# Patient Record
Sex: Female | Born: 1955
Health system: Southern US, Community
[De-identification: ages and names within clinical notes are randomized; demographics above are authoritative.]

## PROBLEM LIST (undated history)

## (undated) DIAGNOSIS — I2510 Atherosclerotic heart disease of native coronary artery without angina pectoris: Secondary | ICD-10-CM

## (undated) DIAGNOSIS — T8859XA Other complications of anesthesia, initial encounter: Secondary | ICD-10-CM

## (undated) DIAGNOSIS — I10 Essential (primary) hypertension: Secondary | ICD-10-CM

## (undated) DIAGNOSIS — R000 Tachycardia, unspecified: Secondary | ICD-10-CM

## (undated) DIAGNOSIS — N289 Disorder of kidney and ureter, unspecified: Secondary | ICD-10-CM

## (undated) DIAGNOSIS — J189 Pneumonia, unspecified organism: Secondary | ICD-10-CM

## (undated) HISTORY — PX: FRACTURE SURGERY: SHX138

## (undated) HISTORY — PX: OTHER SURGICAL HISTORY: SHX169

---

## 2005-05-06 DIAGNOSIS — I2510 Atherosclerotic heart disease of native coronary artery without angina pectoris: Secondary | ICD-10-CM

## 2005-05-06 HISTORY — DX: Atherosclerotic heart disease of native coronary artery without angina pectoris: I25.10

## 2011-11-02 ENCOUNTER — Encounter (HOSPITAL_COMMUNITY): Payer: Self-pay | Admitting: *Deleted

## 2011-11-02 ENCOUNTER — Emergency Department (HOSPITAL_COMMUNITY)
Admission: EM | Admit: 2011-11-02 | Discharge: 2011-11-02 | Disposition: A | Discharge status: Home / Self Care | Payer: 59 | Attending: Emergency Medicine | Admitting: Emergency Medicine

## 2011-11-02 DIAGNOSIS — I10 Essential (primary) hypertension: Secondary | ICD-10-CM

## 2011-11-02 DIAGNOSIS — E871 Hypo-osmolality and hyponatremia: Secondary | ICD-10-CM | POA: Insufficient documentation

## 2011-11-02 DIAGNOSIS — R109 Unspecified abdominal pain: Secondary | ICD-10-CM | POA: Insufficient documentation

## 2011-11-02 DIAGNOSIS — R000 Tachycardia, unspecified: Secondary | ICD-10-CM | POA: Insufficient documentation

## 2011-11-02 HISTORY — DX: Essential (primary) hypertension: I10

## 2011-11-02 HISTORY — DX: Disorder of kidney and ureter, unspecified: N28.9

## 2011-11-02 LAB — BASIC METABOLIC PANEL
CO2: 23 mEq/L (ref 19–32)
Calcium: 9.5 mg/dL (ref 8.4–10.5)
Chloride: 90 mEq/L — ABNORMAL LOW (ref 96–112)
GFR calc non Af Amer: >90 mL/min (ref >90)
Potassium: 3.5 mEq/L (ref 3.5–5.1)

## 2011-11-02 LAB — CBC WITH DIFFERENTIAL/PLATELET
Basophils Absolute: 0 10*3/uL (ref 0.0–0.1)
Basophils Relative: 0 % (ref 0–1)
HCT: 41.8 % (ref 36.0–46.0)
Hemoglobin: 14.9 g/dL (ref 12.0–15.0)
Lymphocytes Relative: 23 % (ref 12–46)
MCHC: 35.6 g/dL (ref 30.0–36.0)
Neutro Abs: 7.2 10*3/uL (ref 1.7–7.7)
Neutrophils Relative %: 67 % (ref 43–77)
RDW: 13.8 % (ref 11.5–15.5)
WBC: 10.7 10*3/uL — ABNORMAL HIGH (ref 4.0–10.5)

## 2011-11-02 LAB — URINALYSIS, ROUTINE W REFLEX MICROSCOPIC
Bilirubin Urine: NEGATIVE
Glucose, UA: NEGATIVE mg/dL
Hgb urine dipstick: NEGATIVE
Ketones, ur: NEGATIVE mg/dL
Leukocytes, UA: NEGATIVE
Nitrite: NEGATIVE
Protein, ur: NEGATIVE mg/dL
Specific Gravity, Urine: 1.01 (ref 1.005–1.030)
Urobilinogen, UA: 0.2 mg/dL (ref 0.0–1.0)
pH: 6 (ref 5.0–8.0)

## 2011-11-02 MED ORDER — KETOROLAC TROMETHAMINE 30 MG/ML IJ SOLN
30.0000 mg | Freq: Once | INTRAMUSCULAR | Status: AC
  Administered 2011-11-02: 30 mg via INTRAVENOUS
  Filled 2011-11-02: qty 1

## 2011-11-02 MED ORDER — HYDROCODONE-ACETAMINOPHEN 5-325 MG PO TABS: 1.0000 | ORAL_TABLET | Freq: Four times a day (QID) | ORAL | Status: AC | PRN

## 2011-11-02 MED ORDER — NAPROXEN 375 MG PO TABS: 375.0000 mg | ORAL_TABLET | Freq: Two times a day (BID) | ORAL | Status: AC

## 2011-11-02 NOTE — ED Provider Notes (Signed)
History     CSN: [id number]  Arrival date & time 11/02/11  1624   First MD Initiated Contact with Patient 11/02/11 1739      Chief Complaint  Patient presents with  . Flank Pain    right    (Consider location/radiation/quality/duration/timing/severity/associated sxs/prior treatment) HPI Comments: Patient is a 56 year old with a history of hypertension and past renal disease that presents emergency department concerned about her blood pressure and reporting some right flank pain described as a pressure and throbbing.  Onset of symptoms was 930 a.m., severity rated between 2 and 4, pain relieved with movement, no exacerbating factors noted.  Patient denies urinary symptoms including frequency, nocturia, hematuria, polyuria, or dysuria.  Patient states that she had a renal stent placed about 5 years ago when living in [place] however she did not recall the reason and states that she thought it was for her hypertension.  Patient did not have a renal specialist in town.  Patient denies associated symptoms including fevers, night sweats, chills, headaches, change in vision, abdominal pain, nausea, vomiting, chest pain, dizziness, light headedness, syncope, heart palpitations and, melena, hematochezia.  Patient has no other complaints at this time.  Patient is a 56 y.o. female presenting with flank pain. The history is provided by the patient.  Flank Pain Pertinent negatives include no abdominal pain, chest pain, chills, congestion, fever, headaches, numbness or weakness.    Past Medical History  Diagnosis Date  . Hypertension   . Renal disorder     Past Surgical History  Procedure Date  . Stent placement right kidney    . Fracture surgery   . Cesarean section     History reviewed. No pertinent family history.  History  Substance Use Topics  . Smoking status: Current Some Day Smoker    Types: Cigarettes  . Smokeless tobacco: Never Used  . Alcohol Use: Yes     weekends    OB  History    Grav Para Term Preterm Abortions TAB SAB Ect Mult Living                  Review of Systems  Constitutional: Negative for fever, chills and appetite change.  HENT: Negative for congestion.   Eyes: Negative for visual disturbance.  Respiratory: Negative for shortness of breath.   Cardiovascular: Negative for chest pain and leg swelling.  Gastrointestinal: Negative for abdominal pain.  Genitourinary: Positive for flank pain. Negative for dysuria, urgency and frequency.  Neurological: Negative for dizziness, syncope, weakness, light-headedness, numbness and headaches.  Psychiatric/Behavioral: Negative for confusion.  All other systems reviewed and are negative.    Allergies  Review of patient's allergies indicates no known allergies.  Home Medications   Current Outpatient Rx  Name Route Sig Dispense Refill  . AMOXICILLIN 500 MG PO CAPS Oral Take 500 mg by mouth 4 (four) times daily.    . ASPIRIN 81 MG PO TABS Oral Take 81 mg by mouth daily.    Marland Kitchen LISINOPRIL-HYDROCHLOROTHIAZIDE 20-12.5 MG PO TABS Oral Take 1 tablet by mouth daily.    Marland Kitchen PROBIOTIC PO Oral Take 1 capsule by mouth daily.      BP 182/108  Pulse 108  Temp 97.9 F (36.6 C) (Oral)  Resp 18  Ht 5\' 2"  (1.575 m)  Wt 139 lb (63.05 kg)  BMI 25.42 kg/m2  SpO2 97%  Physical Exam  Nursing note and vitals reviewed. Constitutional: She is oriented to person, place, and time. She appears well-developed and well-nourished. No distress.  Hypertensive and tachycardic  HENT:  Head: Normocephalic and atraumatic.  Eyes: Conjunctivae and EOM are normal.  Neck: Normal range of motion.  Cardiovascular:       Tachycardic, regular rhythm, no aberrancy and auscultation, intact distal pulses.  Pulmonary/Chest: Effort normal.  Abdominal:       Bowel sounds present, soft nontender.  Nonpulsatile aorta  Genitourinary:       No CVA tenderness.  Musculoskeletal: Normal range of motion.  Neurological: She is alert and  oriented to person, place, and time.  Skin: Skin is warm and dry. No rash noted. She is not diaphoretic.  Psychiatric: She has a normal mood and affect. Her behavior is normal.    ED Course  Procedures (including critical care time)  Labs Reviewed  CBC WITH DIFFERENTIAL - Abnormal; Notable for the following:    WBC 10.7 (*)     MCH 34.7 (*)     All other components within normal limits  URINALYSIS, ROUTINE W REFLEX MICROSCOPIC  BASIC METABOLIC PANEL   No results found.   No diagnosis found.    MDM  Hypertension, hyponatremia  Results reviewed and discussed with both the attending & pt who has been advised to follow up with her PCP this week to possibly adj HTN medications. Pt does not want a CT for unlikely stone bc normal urine and minimal flank pain (no CVA or abd tenderness on exam). Return precautions discussed.  At this time there does not appear to be any evidence of an acute emergency medical condition and the patient appears stable for discharge with appropriate outpatient follow up.Diagnosis was discussed with patient who verbalizes understanding and is agreeable to discharge. Pt case discussed with Dr. Juleen China who agrees with my plan.          Kristin Schmidt, [place] 11/02/11 1954

## 2011-11-02 NOTE — ED Notes (Signed)
Pt from home with reports of right flank pain as well as elevated blood pressure. Pt denies nausea and vomiting but is having diarrhea but reports that she is on an antibiotic for dental issues. Pt also endorses hx of stent placement of right kidney about 5 years ago due to elevated blood pressure.

## 2011-11-02 NOTE — ED Notes (Signed)
Patient took lisinpril-HCTZ 20/12.5mg  prior to coming in the ED. BP is fluctuating from 150-200s / 80-90s. Asymptomatic. Pain flank are is interm. awaiting for lab results

## 2011-11-02 NOTE — ED Notes (Signed)
Bed:WA09<BR> Expected date:<BR> Expected time:<BR> Means of arrival:<BR> Comments:<BR> Triage 4

## 2011-11-02 NOTE — Discharge Instructions (Signed)
Follow up w ur doctor to adjust your blood pressure medications  SEEK IMMEDIATE MEDICAL CARE IF:  The pain does not go away.  You have a fever >101 that persists You keep throwing up (vomiting) or cannot drink liquids.  The pain becomes localized (Pain in the right side could possibly be appendicitis. In an adult, pain in the left lower portion of the abdomen could be colitis or diverticulitis). You pass bloody or black tarry stools.  You have shaking chills.  There is blood in your vomit or you see blood in your bowel movements.  Your bowel movements stop (become blocked) or you cannot pass gas.  You have bloody, frequent, or painful urination.  You have yellow discoloration in the skin or whites of the eyes.  Your stomach becomes bloated or bigger.  You have dizziness or fainting.  You have chest or back pain.

## 2011-11-07 NOTE — ED Provider Notes (Signed)
Medical screening examination/treatment/procedure(s) were performed by non-physician practitioner and as supervising physician I was immediately available for consultation/collaboration.  Raeford Razor, MD 11/07/11 [phone]

## 2013-10-25 ENCOUNTER — Emergency Department (HOSPITAL_COMMUNITY): Payer: BC Managed Care – PPO

## 2013-10-25 ENCOUNTER — Encounter (HOSPITAL_COMMUNITY): Payer: Self-pay | Admitting: Emergency Medicine

## 2013-10-25 ENCOUNTER — Emergency Department (HOSPITAL_COMMUNITY)
Admission: EM | Admit: 2013-10-25 | Discharge: 2013-10-25 | Disposition: A | Discharge status: Home / Self Care | Payer: BC Managed Care – PPO | Attending: Emergency Medicine | Admitting: Emergency Medicine

## 2013-10-25 DIAGNOSIS — Z87448 Personal history of other diseases of urinary system: Secondary | ICD-10-CM | POA: Insufficient documentation

## 2013-10-25 DIAGNOSIS — Z7982 Long term (current) use of aspirin: Secondary | ICD-10-CM | POA: Insufficient documentation

## 2013-10-25 DIAGNOSIS — F419 Anxiety disorder, unspecified: Secondary | ICD-10-CM

## 2013-10-25 DIAGNOSIS — Z792 Long term (current) use of antibiotics: Secondary | ICD-10-CM | POA: Insufficient documentation

## 2013-10-25 DIAGNOSIS — R000 Tachycardia, unspecified: Secondary | ICD-10-CM | POA: Insufficient documentation

## 2013-10-25 DIAGNOSIS — F17200 Nicotine dependence, unspecified, uncomplicated: Secondary | ICD-10-CM | POA: Insufficient documentation

## 2013-10-25 DIAGNOSIS — I10 Essential (primary) hypertension: Secondary | ICD-10-CM | POA: Insufficient documentation

## 2013-10-25 DIAGNOSIS — F411 Generalized anxiety disorder: Secondary | ICD-10-CM | POA: Insufficient documentation

## 2013-10-25 DIAGNOSIS — R0789 Other chest pain: Secondary | ICD-10-CM | POA: Insufficient documentation

## 2013-10-25 DIAGNOSIS — Z79899 Other long term (current) drug therapy: Secondary | ICD-10-CM | POA: Insufficient documentation

## 2013-10-25 LAB — URINALYSIS, ROUTINE W REFLEX MICROSCOPIC
BILIRUBIN URINE: NEGATIVE
Glucose, UA: NEGATIVE mg/dL
HGB URINE DIPSTICK: NEGATIVE
Ketones, ur: NEGATIVE mg/dL
Leukocytes, UA: NEGATIVE
Nitrite: NEGATIVE
PROTEIN: NEGATIVE mg/dL
Specific Gravity, Urine: 1.002 — ABNORMAL LOW (ref 1.005–1.030)
UROBILINOGEN UA: 0.2 mg/dL (ref 0.0–1.0)
pH: 5.5 (ref 5.0–8.0)

## 2013-10-25 LAB — COMPREHENSIVE METABOLIC PANEL
ALT: 13 U/L (ref 0–35)
AST: 20 U/L (ref 0–37)
Albumin: 4 g/dL (ref 3.5–5.2)
Alkaline Phosphatase: 61 U/L (ref 39–117)
BILIRUBIN TOTAL: 0.3 mg/dL (ref 0.3–1.2)
BUN: 13 mg/dL (ref 6–23)
CHLORIDE: 95 meq/L — AB (ref 96–112)
CO2: 20 meq/L (ref 19–32)
CREATININE: 0.59 mg/dL (ref 0.50–1.10)
Calcium: 9.1 mg/dL (ref 8.4–10.5)
GLUCOSE: 97 mg/dL (ref 70–99)
Potassium: 4.3 mEq/L (ref 3.7–5.3)
Sodium: 134 mEq/L — ABNORMAL LOW (ref 137–147)
Total Protein: 7.2 g/dL (ref 6.0–8.3)

## 2013-10-25 LAB — CBC WITH DIFFERENTIAL/PLATELET
BASOS ABS: 0.1 10*3/uL (ref 0.0–0.1)
Basophils Relative: 1 % (ref 0–1)
Eosinophils Absolute: 0 10*3/uL (ref 0.0–0.7)
Eosinophils Relative: 0 % (ref 0–5)
HEMATOCRIT: 37.9 % (ref 36.0–46.0)
HEMOGLOBIN: 13 g/dL (ref 12.0–15.0)
LYMPHS ABS: 2.2 10*3/uL (ref 0.7–4.0)
LYMPHS PCT: 21 % (ref 12–46)
MCH: 32.3 pg (ref 26.0–34.0)
MCHC: 34.3 g/dL (ref 30.0–36.0)
MCV: 94.3 fL (ref 78.0–100.0)
MONO ABS: 0.7 10*3/uL (ref 0.1–1.0)
MONOS PCT: 7 % (ref 3–12)
NEUTROS ABS: 7.6 10*3/uL (ref 1.7–7.7)
Neutrophils Relative %: 71 % (ref 43–77)
Platelets: 288 10*3/uL (ref 150–400)
RBC: 4.02 MIL/uL (ref 3.87–5.11)
RDW: 13.8 % (ref 11.5–15.5)
WBC: 10.6 10*3/uL — AB (ref 4.0–10.5)

## 2013-10-25 LAB — TROPONIN I

## 2013-10-25 LAB — I-STAT TROPONIN, ED: Troponin i, poc: 0.04 ng/mL (ref 0.00–0.08)

## 2013-10-25 MED ORDER — LORAZEPAM 2 MG/ML IJ SOLN
1.0000 mg | Freq: Once | INTRAMUSCULAR | Status: AC
  Administered 2013-10-25: 1 mg via INTRAVENOUS
  Filled 2013-10-25: qty 1

## 2013-10-25 MED ORDER — SODIUM CHLORIDE 0.9 % IV BOLUS (SEPSIS)
1000.0000 mL | Freq: Once | INTRAVENOUS | Status: AC
  Administered 2013-10-25: 1000 mL via INTRAVENOUS

## 2013-10-25 NOTE — ED Notes (Signed)
Pt c/o lightheadedness and feeling anxious. Pt states she drunk wine last night and felt better. Denies pain.

## 2013-10-25 NOTE — Discharge Instructions (Signed)
Chest Pain (Nonspecific) °It is often hard to give a specific diagnosis for the cause of chest pain. There is always a chance that your pain could be related to something serious, such as a heart attack or a blood clot in the lungs. You need to follow up with your health care provider for further evaluation. °CAUSES  °· Heartburn. °· Pneumonia or bronchitis. °· Anxiety or stress. °· Inflammation around your heart (pericarditis) or lung (pleuritis or pleurisy). °· A blood clot in the lung. °· A collapsed lung (pneumothorax). It can develop suddenly on its own (spontaneous pneumothorax) or from trauma to the chest. °· Shingles infection (herpes zoster virus). °The chest wall is composed of bones, muscles, and cartilage. Any of these can be the source of the pain. °· The bones can be bruised by injury. °· The muscles or cartilage can be strained by coughing or overwork. °· The cartilage can be affected by inflammation and become sore (costochondritis). °DIAGNOSIS  °Lab tests or other studies may be needed to find the cause of your pain. Your health care provider may have you take a test called an ambulatory electrocardiogram (ECG). An ECG records your heartbeat patterns over a 24-hour period. You may also have other tests, such as: °· Transthoracic echocardiogram (TTE). During echocardiography, sound waves are used to evaluate how blood flows through your heart. °· Transesophageal echocardiogram (TEE). °· Cardiac monitoring. This allows your health care provider to monitor your heart rate and rhythm in real time. °· Holter monitor. This is a portable device that records your heartbeat and can help diagnose heart arrhythmias. It allows your health care provider to track your heart activity for several days, if needed. °· Stress tests by exercise or by giving medicine that makes the heart beat faster. °TREATMENT  °· Treatment depends on what may be causing your chest pain. Treatment may include: °· Acid blockers for  heartburn. °· Anti-inflammatory medicine. °· Pain medicine for inflammatory conditions. °· Antibiotics if an infection is present. °· You may be advised to change lifestyle habits. This includes stopping smoking and avoiding alcohol, caffeine, and chocolate. °· You may be advised to keep your head raised (elevated) when sleeping. This reduces the chance of acid going backward from your stomach into your esophagus. °Most of the time, nonspecific chest pain will improve within 2-3 days with rest and mild pain medicine.  °HOME CARE INSTRUCTIONS  °· If antibiotics were prescribed, take them as directed. Finish them even if you start to feel better. °· For the next few days, avoid physical activities that bring on chest pain. Continue physical activities as directed. °· Do not use any tobacco products, including cigarettes, chewing tobacco, or electronic cigarettes. °· Avoid drinking alcohol. °· Only take medicine as directed by your health care provider. °· Follow your health care provider's suggestions for further testing if your chest pain does not go away. °· Keep any follow-up appointments you made. If you do not go to an appointment, you could develop lasting (chronic) problems with pain. If there is any problem keeping an appointment, call to reschedule. °SEEK MEDICAL CARE IF:  °· Your chest pain does not go away, even after treatment. °· You have a rash with blisters on your chest. °· You have a fever. °SEEK IMMEDIATE MEDICAL CARE IF:  °· You have increased chest pain or pain that spreads to your arm, neck, jaw, back, or abdomen. °· You have shortness of breath. °· You have an increasing cough, or you cough   up blood. °· You have severe back or abdominal pain. °· You feel nauseous or vomit. °· You have severe weakness. °· You faint. °· You have chills. °This is an emergency. Do not wait to see if the pain will go away. Get medical help at once. Call your local emergency services (911 in [place] Do not drive  yourself to the hospital. °MAKE SURE YOU:  °· Understand these instructions. °· Will watch your condition. °· Will get help right away if you are not doing well or get worse. °Document Released: 01/30/2005 Document Revised: 04/27/2013 Document Reviewed: 11/26/2007 °ExitCare® Patient Information ©2015 [employer]. This information is not intended to replace advice given to you by your health care provider. Make sure you discuss any questions you have with your health care provider. ° °Panic Attacks °Panic attacks are sudden, short-lived surges of severe anxiety, fear, or discomfort. They may occur for no reason when you are relaxed, when you are anxious, or when you are sleeping. Panic attacks may occur for a number of reasons:  °· Healthy people occasionally have panic attacks in extreme, life-threatening situations, such as war or natural disasters. Normal anxiety is a protective mechanism of the body that helps us react to danger (fight or flight response). °· Panic attacks are often seen with anxiety disorders, such as panic disorder, social anxiety disorder, generalized anxiety disorder, and phobias. Anxiety disorders cause excessive or uncontrollable anxiety. They may interfere with your relationships or other life activities. °· Panic attacks are sometimes seen with other mental illnesses, such as depression and posttraumatic stress disorder. °· Certain medical conditions, prescription medicines, and drugs of abuse can cause panic attacks. °SYMPTOMS  °Panic attacks start suddenly, peak within 20 minutes, and are accompanied by four or more of the following symptoms: °· Pounding heart or fast heart rate (palpitations). °· Sweating. °· Trembling or shaking. °· Shortness of breath or feeling smothered. °· Feeling choked. °· Chest pain or discomfort. °· Nausea or strange feeling in your stomach. °· Dizziness, light-headedness, or feeling like you will faint. °· Chills or hot flushes. °· Numbness or tingling in  your lips or hands and feet. °· Feeling that things are not real or feeling that you are not yourself. °· Fear of losing control or going crazy. °· Fear of dying. °Some of these symptoms can mimic serious medical conditions. For example, you may think you are having a heart attack. Although panic attacks can be very scary, they are not life threatening. °DIAGNOSIS  °Panic attacks are diagnosed through an assessment by your health care provider. Your health care provider will ask questions about your symptoms, such as where and when they occurred. Your health care provider will also ask about your medical history and use of alcohol and drugs, including prescription medicines. Your health care provider may order blood tests or other studies to rule out a serious medical condition. Your health care provider may refer you to a mental health professional for further evaluation. °TREATMENT  °· Most healthy people who have one or two panic attacks in an extreme, life-threatening situation will not require treatment. °· The treatment for panic attacks associated with anxiety disorders or other mental illness typically involves counseling with a mental health professional, medicine, or a combination of both. Your health care provider will help determine what treatment is best for you. °· Panic attacks due to physical illness usually go away with treatment of the illness. If prescription medicine is causing panic attacks, talk with your health care   provider about stopping the medicine, decreasing the dose, or substituting another medicine. °· Panic attacks due to alcohol or drug abuse go away with abstinence. Some adults need professional help in order to stop drinking or using drugs. °HOME CARE INSTRUCTIONS  °· Take all medicines as directed by your health care provider.   °· Schedule and attend follow-up visits as directed by your health care provider. It is important to keep all your appointments. °SEEK MEDICAL CARE  IF: °· You are not able to take your medicines as prescribed. °· Your symptoms do not improve or get worse. °SEEK IMMEDIATE MEDICAL CARE IF:  °· You experience panic attack symptoms that are different than your usual symptoms. °· You have serious thoughts about hurting yourself or others. °· You are taking medicine for panic attacks and have a serious side effect. °MAKE SURE YOU: °· Understand these instructions. °· Will watch your condition. °· Will get help right away if you are not doing well or get worse. °Document Released: 04/22/2005 Document Revised: 04/27/2013 Document Reviewed: 12/04/2012 °ExitCare® Patient Information ©2015 [employer]. This information is not intended to replace advice given to you by your health care provider. Make sure you discuss any questions you have with your health care provider. ° °

## 2013-10-25 NOTE — ED Provider Notes (Signed)
CSN: [id number]     Arrival date & time 10/25/13  0913 History   First MD Initiated Contact with Patient 10/25/13 [phone]     Chief Complaint  Patient presents with  . Dizziness  . Anxiety     (Consider location/radiation/quality/duration/timing/severity/associated sxs/prior Treatment) Patient is a 58 y.o. female presenting with chest pain.  Chest Pain Pain location:  Substernal area Pain quality: tightness   Pain radiates to:  Does not radiate Pain severity:  Moderate Onset quality:  Gradual Duration:  2 days Timing:  Intermittent Progression:  Unchanged Chronicity:  New Context: stress ("I've been under a lot of stress with my job and with my daughter moving")   Relieved by: Started yesterday, drank a glass of wine which resolved her symptoms. Worsened by:  Nothing tried Associated symptoms: anxiety, dizziness (Described as swimmy headed.  Not a room spinning and not lightheaded.) and shortness of breath (None currently)   Associated symptoms: no abdominal pain, no nausea and not vomiting     Past Medical History  Diagnosis Date  . Hypertension   . Renal disorder    Past Surgical History  Procedure Laterality Date  . Stent placement right kidney     . Cesarean section    . Fracture surgery      leg   No family history on file. History  Substance Use Topics  . Smoking status: Current Some Day Smoker    Types: Cigarettes  . Smokeless tobacco: Never Used  . Alcohol Use: Yes     Comment: weekends   OB History   Grav Para Term Preterm Abortions TAB SAB Ect Mult Living                 Review of Systems  Respiratory: Positive for shortness of breath (None currently).   Cardiovascular: Positive for chest pain.  Gastrointestinal: Negative for nausea, vomiting and abdominal pain.  Neurological: Positive for dizziness (Described as swimmy headed.  Not a room spinning and not lightheaded.).  All other systems reviewed and are negative.     Allergies  Review of  patient's allergies indicates no known allergies.  Home Medications   Prior to Admission medications   Medication Sig Start Date End Date Taking? Authorizing Provider  amoxicillin (AMOXIL) 500 MG capsule Take 500 mg by mouth 4 (four) times daily.    Historical Provider, MD  aspirin 81 MG tablet Take 81 mg by mouth daily.    Historical Provider, MD  lisinopril-hydrochlorothiazide (PRINZIDE,ZESTORETIC) 20-12.5 MG per tablet Take 1 tablet by mouth daily.    Historical Provider, MD  Probiotic Product (PROBIOTIC PO) Take 1 capsule by mouth daily.    Historical Provider, MD   BP 217/95  Pulse 136  Temp(Src) 98.2 F (36.8 C) (Oral)  Resp 20  SpO2 99% Physical Exam  Nursing note and vitals reviewed. Constitutional: She is oriented to person, place, and time. She appears well-developed and well-nourished. No distress.  HENT:  Head: Normocephalic and atraumatic.  Mouth/Throat: Oropharynx is clear and moist.  Eyes: Conjunctivae are normal. Pupils are equal, round, and reactive to light. No scleral icterus.  Neck: Neck supple.  Cardiovascular: Regular rhythm, normal heart sounds and intact distal pulses.  Tachycardia present.   No murmur heard. Pulmonary/Chest: Effort normal and breath sounds normal. No stridor. No respiratory distress. She has no rales.  Abdominal: Soft. Bowel sounds are normal. She exhibits no distension. There is no tenderness.  Musculoskeletal: Normal range of motion. She exhibits no edema.  Neurological: She  is alert and oriented to person, place, and time. She has normal strength. No cranial nerve deficit or sensory deficit. Coordination and gait normal. GCS eye subscore is 4. GCS verbal subscore is 5. GCS motor subscore is 6.  Skin: Skin is warm and dry. No rash noted.  Psychiatric: She has a normal mood and affect. Her behavior is normal.    ED Course  Procedures (including critical care time) Labs Review Labs Reviewed - No data to display  Imaging Review Dg  Chest 2 View  10/25/2013   CLINICAL DATA:  Chest pain.  EXAM: CHEST  2 VIEW  COMPARISON:  None.  FINDINGS: The heart size and mediastinal contours are within normal limits. Both lungs are clear. No pleural effusion or pneumothorax is noted. The visualized skeletal structures are unremarkable.  IMPRESSION: No acute cardiopulmonary abnormality seen.   Electronically Signed   By: Roque LiasJames  Green M.D.   On: 10/25/2013 11:27  All radiology studies independently viewed by me.      EKG Interpretation   Date/Time:  Monday October 25 2013 11:25:35 EDT Ventricular Rate:  116 PR Interval:  162 QRS Duration: 77 QT Interval:  320 QTC Calculation: 444 R Axis:   88 Text Interpretation:  Sinus tachycardia Right atrial enlargement  Borderline right axis deviation Artifact in lead(s) II aVR aVL and  baseline wander in lead(s) II III aVF No significant change was found  Confirmed by [facility] CenterWOFFORD  MD, TREY ([id number]) on 10/25/2013 2:15:39 PM      MDM   Final diagnoses:  Other chest pain  Anxiety    58 year old female with history of hypertension presenting with chest pain and dizziness. She initially thought her symptoms were secondary to anxiety, but wanted to get checked out "to make sure I wasn't having a heart attack or a stroke."  On exam, well-appearing, nontoxic, but tachycardic and hypertensive. History atypical for ACS, PE, or dissection.  Lab work negative. Blood pressure and heart rate improved.  Symptoms resolved after Ativan. 2 troponins were negative. Heart rate less than 100 on my discharge exam. Still hypertensive, but without signs of hypertensive emergency. Advised PCP followup. Given return precautions.  Candyce ChurnJohn David Wofford III, MD 10/25/13 [phone]

## 2015-09-11 DIAGNOSIS — I10 Essential (primary) hypertension: Secondary | ICD-10-CM | POA: Insufficient documentation

## 2015-12-13 DIAGNOSIS — E785 Hyperlipidemia, unspecified: Secondary | ICD-10-CM | POA: Insufficient documentation

## 2016-02-18 ENCOUNTER — Emergency Department (HOSPITAL_COMMUNITY): Payer: BLUE CROSS/BLUE SHIELD

## 2016-02-18 ENCOUNTER — Encounter (HOSPITAL_COMMUNITY): Payer: Self-pay | Admitting: Emergency Medicine

## 2016-02-18 ENCOUNTER — Emergency Department (HOSPITAL_COMMUNITY)
Admission: EM | Admit: 2016-02-18 | Discharge: 2016-02-18 | Disposition: A | Discharge status: Home / Self Care | Payer: BLUE CROSS/BLUE SHIELD | Attending: Emergency Medicine | Admitting: Emergency Medicine

## 2016-02-18 DIAGNOSIS — Z79899 Other long term (current) drug therapy: Secondary | ICD-10-CM | POA: Diagnosis not present

## 2016-02-18 DIAGNOSIS — Z7982 Long term (current) use of aspirin: Secondary | ICD-10-CM | POA: Insufficient documentation

## 2016-02-18 DIAGNOSIS — F17210 Nicotine dependence, cigarettes, uncomplicated: Secondary | ICD-10-CM | POA: Diagnosis not present

## 2016-02-18 DIAGNOSIS — R0602 Shortness of breath: Secondary | ICD-10-CM | POA: Diagnosis present

## 2016-02-18 DIAGNOSIS — I10 Essential (primary) hypertension: Secondary | ICD-10-CM | POA: Insufficient documentation

## 2016-02-18 DIAGNOSIS — R0789 Other chest pain: Secondary | ICD-10-CM | POA: Diagnosis not present

## 2016-02-18 LAB — I-STAT TROPONIN, ED
TROPONIN I, POC: 0 ng/mL (ref 0.00–0.08)
Troponin i, poc: 0 ng/mL (ref 0.00–0.08)

## 2016-02-18 LAB — BASIC METABOLIC PANEL
Anion gap: 8 (ref 5–15)
BUN: 12 mg/dL (ref 6–20)
CALCIUM: 9.1 mg/dL (ref 8.9–10.3)
CO2: 24 mmol/L (ref 22–32)
CREATININE: 0.94 mg/dL (ref 0.44–1.00)
Chloride: 104 mmol/L (ref 101–111)
GFR calc non Af Amer: >60 mL/min (ref >60)
GLUCOSE: 136 mg/dL — AB (ref 65–99)
Potassium: 4.6 mmol/L (ref 3.5–5.1)
Sodium: 136 mmol/L (ref 135–145)

## 2016-02-18 LAB — CBC
HCT: 36.4 % (ref 36.0–46.0)
Hemoglobin: 12.4 g/dL (ref 12.0–15.0)
MCH: 31.7 pg (ref 26.0–34.0)
MCHC: 34.1 g/dL (ref 30.0–36.0)
MCV: 93.1 fL (ref 78.0–100.0)
PLATELETS: 325 10*3/uL (ref 150–400)
RBC: 3.91 MIL/uL (ref 3.87–5.11)
RDW: 13.9 % (ref 11.5–15.5)
WBC: 7.2 10*3/uL (ref 4.0–10.5)

## 2016-02-18 NOTE — Discharge Instructions (Signed)
It was a pleasure taking care of you today. Your EKG, chest X-ray, blood work, and troponin (marker of strain on the heart) all came back negative. Based on these tests and the history you provided, you have a low risk of having a coronary event. Please follow-up with your primary care provider regarding the symptoms you've been having. I've also provided the contact info for Dr. Elberta Fortisamnitz since you mentioned his name and are interested in seeing a cardiologist.

## 2016-02-18 NOTE — ED Notes (Signed)
Pt family member came out of the room and questioned the charge nurse about the frequency of vitals. Family member advised that she took her mother's vitals and handed written vitals to charge RN. Charge RN advised that vitals are obtained every four hours for level 3 patients and her mother is on the monitor as well. Family member sts that she is an ED Charity fundraiserN at [employer]. When phlebotomy placed an orange tourniquet on pt, she advised that she doesn't like orange tourniquets and blue ones are better. Phlebotomy advised that they do not have blue ones.

## 2016-02-18 NOTE — ED Notes (Signed)
Patient adds that she is "minding a broken toe on right foot, today is my first day in a regular shoe".

## 2016-02-18 NOTE — ED Triage Notes (Signed)
Patient c/o SOB and chest pressure that is intermittent over the past 2 days.  Patient states that had some nausea with the pain.  Patient adds nothing specific makes the pain come on.

## 2016-02-18 NOTE — ED Provider Notes (Signed)
[facility]-EMERGENCY DEPT Provider Note   CSN: [id number] Arrival date & time: 02/18/16  0904     History   Chief Complaint Chief Complaint  Patient presents with  . Shortness of Breath  . Chest Pain    HPI Kristin Schmidt is a 60 y.o. female.  Patient is 60 yo F with PMH of hypertension, atherosclerosis (underwent cardiac cath in [place] in 2007 and had renal artery stent placed), and 30 pack year smoking history, presenting with chief complaint of shortness of breath and "chest tightness" that has been worsening since Friday. She denies any chest pain, or pains radiating into jaw or neck. No personal history of CAD or CHF, or family history of heart disease. She also denies any history of asthma, chronic bronchitis, COPD, or emphysema. She states the shortness of breath is exertional, but she commented she's not been as ambulatory recently because she broke a toe. She denies any history of DVT/PE. Denies exogenous estrogen use, lower extremity pain or swelling, recent travel, history of bleeding/clotting disorders, cough, or hemoptysis. Also states she felt nauseous on Friday, but symptoms have resolved. Denies any abdominal pain, vomiting, change in bowel habits, or blood in stools. Regularly sees PCP, Laureen Ochs, with last appointment on 12/13/2015, but states she's been having a hard time managing her BP. Takes daily losartan. Does not see cardiologist. Commented that she has a stressful job working in [employer], and has experienced similar "tightness" when feeling anxious.      Past Medical History:  Diagnosis Date  . Hypertension   . Renal disorder     There are no active problems to display for this patient.   Past Surgical History:  Procedure Laterality Date  . CESAREAN SECTION    . FRACTURE SURGERY     leg  . stent placement right kidney       OB History    No data available       Home Medications    Prior to Admission medications   Medication Sig  Start Date End Date Taking? Authorizing Provider  aspirin 81 MG tablet Take 81 mg by mouth daily.    Historical Provider, MD  losartan (COZAAR) 100 MG tablet Take 100 mg by mouth daily.    Historical Provider, MD  nicotine (NICODERM CQ - DOSED IN MG/24 HOURS) 21 mg/24hr patch Place 21 mg onto the skin daily.    Historical Provider, MD    Family History No family history on file.  Social History Social History  Substance Use Topics  . Smoking status: Current Some Day Smoker    Types: Cigarettes  . Smokeless tobacco: Never Used  . Alcohol use Yes     Comment: weekends     Allergies   Review of patient's allergies indicates no known allergies.   Review of Systems Review of Systems  Constitutional: Negative for chills and fever.  HENT: Negative for ear pain and sore throat.   Eyes: Negative for pain and visual disturbance.  Respiratory: Positive for chest tightness and shortness of breath. Negative for cough and wheezing.   Cardiovascular: Negative for chest pain, palpitations and leg swelling.  Gastrointestinal: Positive for nausea. Negative for abdominal pain, blood in stool and vomiting.  Genitourinary: Negative for dysuria, flank pain and hematuria.  Musculoskeletal: Negative for back pain and neck pain.  Skin: Negative for color change and rash.  Neurological: Negative for dizziness, seizures, syncope, weakness, numbness and headaches.     Physical Exam Updated Vital Signs BP 168/69 (BP  Location: Left Arm)   Pulse 71   Temp 98 F (36.7 C) (Oral)   Resp 19   Ht 5\' 2"  (1.575 m)   Wt 67.1 kg   SpO2 100%   BMI 27.07 kg/m   Physical Exam  Constitutional: She appears well-developed and well-nourished. No distress.  Resting comfortably in bed, O2 sat 99% RA  HENT:  Head: Normocephalic and atraumatic.  Mouth/Throat: Oropharynx is clear and moist.  Eyes: Conjunctivae are normal.  Neck: Normal range of motion.  Cardiovascular: Normal rate, regular rhythm, normal  heart sounds and intact distal pulses.   Pulmonary/Chest: Effort normal and breath sounds normal. No respiratory distress.  Abdominal: Soft. There is no tenderness.  Musculoskeletal: Normal range of motion. She exhibits no edema or tenderness.  Neurological: She is alert.  Skin: Skin is warm and dry.  Psychiatric: She has a normal mood and affect.  Nursing note and vitals reviewed.    ED Treatments / Results  Labs (all labs ordered are listed, but only abnormal results are displayed) Labs Reviewed  CBC  BASIC METABOLIC PANEL  I-STAT TROPOININ, ED    EKG  EKG Interpretation  Date/Time:  Sunday February 18 2016 09:14:17 EDT Ventricular Rate:  78 PR Interval:    QRS Duration: 77 QT Interval:  398 QTC Calculation: 454 R Axis:   74 Text Interpretation:  Sinus rhythm Minimal ST elevation, inferior leads Baseline wander Confirmed by Lincoln Brighamees, Liz [phone]([id number]) on 02/18/2016 9:20:23 AM       Radiology Dg Chest 2 View  Result Date: 02/18/2016 CLINICAL DATA:  Chest pain EXAM: CHEST  2 VIEW COMPARISON:  October 25, 2013 FINDINGS: The heart size and mediastinal contours are within normal limits. Both lungs are clear. The visualized skeletal structures are unremarkable. IMPRESSION: No active cardiopulmonary disease. Electronically Signed   By: Gerome Samavid  Williams III M.D   On: 02/18/2016 09:40    Procedures Procedures (including critical care time)  Medications Ordered in ED Medications - No data to display   Initial Impression / Assessment and Plan / ED Course  I have reviewed the triage vital signs and the nursing notes.  Pertinent labs & imaging results that were available during my care of the patient were reviewed by me and considered in my medical decision making (see chart for details).  Clinical Course  Value Comment By Time  DG Chest 2 View (Reviewed) Jari PiggDaryl F de Villier II, [place] 60/4510/15 1100   Patient is 60 yo F with PMH of hypertension, atherosclerosis, and 30 pack year smoking  history, presenting with chief complaint of shortness of breath and chest tightness since Friday. No personal or family history of CAD . No chronic respiratory disease, and low risk factors for  DVT/PE. Reports similar symptoms when anxious. EKG sinus rhythm with some baseline wander, reviewed by attending physician, Dr. Tilden FossaElizabeth Rees. CBC and BMP unremarkable. Personally reviewed CRX, which shows no acute cardiopulmonary disease. Initial and 3 hour troponin both 0.00. Heart score of 3, and patient stable for d/c home. Advised to f/u with PCP for management of hypertension, and possible cardiology referral. Discussed strict return precautions for symptoms including chest pain, shortness of breath, nausea, vomiting, dizziness, or syncope. Patient agreeable to plan, and appreciative of care.  Final Clinical Impressions(s) / ED Diagnoses   Final diagnoses:  Shortness of breath  Chest tightness    New Prescriptions New Prescriptions   No medications on file     Jari PiggDaryl F de Villier II, [place] 02/19/16 1347  Tilden Fossa, MD 02/20/16 1014

## 2016-02-19 ENCOUNTER — Encounter: Payer: Self-pay | Admitting: Cardiology

## 2016-02-20 NOTE — Progress Notes (Signed)
Electrophysiology Office Note   Date:  02/21/2016   ID:  Kristin Schmidt, DOB March 02, 1956, MRN [id number]  PCP:  Eather Colas, FNP  Cardiologist:   Regan Lemming, MD    Chief Complaint  Patient presents with  . New Patient (Initial Visit)    SOB     History of Present Illness: Kristin Schmidt is a 60 y.o. female who presents today for electrophysiology evaluation.   PMH of hypertension, atherosclerosis (underwent cardiac cath in [place] in 2007 and had renal artery stent placed, as well as a stent to her left subclavian), and 30 pack year smoking history. Presented to the ER with SOB and chest tightness. No chest pain or radiation of tightness. SOB is exertional . No abdominal complaints at the time. Also with chest tightness when feeling anxious. She says that her chest discomfort occurs for most of the day and is potentially exacerbated by exertion. Unfortunately she broke her toe a few months ago and has not been able to get regular exercise. She says that she has gained approximately 15 pounds in the last 2 years. She feels like this is due to the addition of metoprolol to her medication regimen.   Today, she denies symptoms of palpitations, chest pain, shortness of breath, orthopnea, PND, lower extremity edema, claudication, dizziness, presyncope, syncope, bleeding, or neurologic sequela. The patient is tolerating medications without difficulties and is otherwise without complaint today.    Past Medical History:  Diagnosis Date  . Hypertension   . Renal disorder    Past Surgical History:  Procedure Laterality Date  . CESAREAN SECTION    . FRACTURE SURGERY     leg  . stent placement right kidney        Current Outpatient Prescriptions  Medication Sig Dispense Refill  . aspirin EC 81 MG tablet Take 81 mg by mouth daily.    Marland Kitchen losartan (COZAAR) 100 MG tablet Take 100 mg by mouth daily.    Marland Kitchen zolpidem (AMBIEN CR) 6.25 MG CR tablet Take 6.25 mg by mouth at  bedtime as needed for sleep.     No current facility-administered medications for this visit.     Allergies:   Review of patient's allergies indicates no known allergies.   Social History:  The patient  reports that she has been smoking Cigarettes.  She has never used smokeless tobacco. She reports that she drinks alcohol. She reports that she does not use drugs.   Family History:  The patient's family history includes COPD in her father and mother; Cancer in her sister; Heart attack in her maternal grandfather.    ROS:  Please see the history of present illness.   Otherwise, review of systems is positive for chest pressure, DOE.   All other systems are reviewed and negative.    PHYSICAL EXAM: VS:  BP (!) 156/80   Pulse 80   Ht 5\' 2"  (1.575 m)   Wt 151 lb 3.2 oz (68.6 kg)   BMI 27.65 kg/m  , BMI Body mass index is 27.65 kg/m. GEN: Well nourished, well developed, in no acute distress  HEENT: normal  Neck: no JVD, carotid bruits, or masses Cardiac: RRR; no murmurs, rubs, or gallops,no edema  Respiratory:  clear to auscultation bilaterally, normal work of breathing GI: soft, nontender, nondistended, + BS MS: no deformity or atrophy  Skin: warm and dry Neuro:  Strength and sensation are intact Psych: euthymic mood, full affect  EKG:  EKG is not ordered today. Personal  review of the ekg ordered 02/19/16 shows sinus rhythm, rate 78, early repolarization  Recent Labs: 02/18/2016: BUN 12; Creatinine, Ser 0.94; Hemoglobin 12.4; Platelets 325; Potassium 4.6; Sodium 136    Lipid Panel  No results found for: CHOL, TRIG, HDL, CHOLHDL, VLDL, LDLCALC, LDLDIRECT   Wt Readings from Last 3 Encounters:  02/21/16 151 lb 3.2 oz (68.6 kg)  02/18/16 148 lb (67.1 kg)  11/02/11 139 lb (63 kg)      Other studies Reviewed: Additional studies/ records that were reviewed today include: ER notes    ASSESSMENT AND PLAN:  1.  Chest pressure: Does not appear to be cardiac in nature, but does  have some typical features of shortness of breath with exertion and potentially chest pain which is worse with exertion. We'll order a rest stress Myoview.  2. Hypertension: Elevated today and apparently has been in the 200s at times. We Kristin Schmidt stop her metoprolol and start her on carvedilol 25 mg as well as the addition of 5 mg of Norvasc.   Current medicines are reviewed at length with the patient today.   The patient does not have concerns regarding her medicines.  The following changes were made today:  Stop metoprolol start coreg and norvasc  Labs/ tests ordered today include:  No orders of the defined types were placed in this encounter.    Disposition:   FU with [facility]  3 months  Signed, Kristin Rhoads Jorja LoaMartin Apryl Brymer, MD  02/21/2016 12:03 PM     [facility] [address] [place] [address] [phone] (office) [phone] (fax)

## 2016-02-21 ENCOUNTER — Encounter: Payer: Self-pay | Admitting: Cardiology

## 2016-02-21 ENCOUNTER — Ambulatory Visit (INDEPENDENT_AMBULATORY_CARE_PROVIDER_SITE_OTHER): Payer: BLUE CROSS/BLUE SHIELD | Admitting: Cardiology

## 2016-02-21 VITALS — BP 156/80 | HR 80 | Ht 62.0 in | Wt 151.2 lb

## 2016-02-21 DIAGNOSIS — R0609 Other forms of dyspnea: Secondary | ICD-10-CM | POA: Diagnosis not present

## 2016-02-21 MED ORDER — AMLODIPINE BESYLATE 5 MG PO TABS: 5.0000 mg | ORAL_TABLET | Freq: Every day | ORAL | 6 refills | Status: DC

## 2016-02-21 MED ORDER — CARVEDILOL 25 MG PO TABS: 25.0000 mg | ORAL_TABLET | Freq: Two times a day (BID) | ORAL | 6 refills | Status: DC

## 2016-02-21 NOTE — Patient Instructions (Addendum)
Medication Instructions:    Your physician has recommended you make the following change in your medication:   1) STOP Metoprolol  2) START Carvedilol 25 mg twice a day  3) START Amlodipine 5 mg once a day  --- If you need a refill on your cardiac medications before your next appointment, please call your pharmacy. ---  Labwork:  None ordered  Testing/Procedures: Your physician has requested that you have a lexiscan myoview. For further information please visit [email or web address] Please follow instruction sheet, as given.  Follow-Up:  Your physician recommends that you schedule a follow-up appointment in: 3 months with Dr. Elberta Schmidt.  Thank you for choosing [facility]!!     Any Other Special Instructions Will Be Listed Below (If Applicable).  Carvedilol tablets What is this medicine? CARVEDILOL (KAR ve dil ol) is a beta-blocker. Beta-blockers reduce the workload on the heart and help it to beat more regularly. This medicine is used to treat high blood pressure and heart failure. This medicine may be used for other purposes; ask your health care provider or pharmacist if you have questions. What should I tell my health care provider before I take this medicine? They need to know if you have any of these conditions: -circulation problems -diabetes -history of heart attack or heart disease -liver disease -lung or breathing disease, like asthma or emphysema -pheochromocytoma -slow or irregular heartbeat -thyroid disease -an unusual or allergic reaction to carvedilol, other beta-blockers, medicines, foods, dyes, or preservatives -pregnant or trying to get pregnant -breast-feeding How should I use this medicine? Take this medicine by mouth with a glass of water. Follow the directions on the prescription label. It is best to take the tablets with food. Take your doses at regular intervals. Do not take your medicine more often than directed. Do not stop taking except on the  advice of your doctor or health care professional. Talk to your pediatrician regarding the use of this medicine in children. Special care may be needed. Overdosage: If you think you have taken too much of this medicine contact a poison control center or emergency room at once. NOTE: This medicine is only for you. Do not share this medicine with others. What if I miss a dose? If you miss a dose, take it as soon as you can. If it is almost time for your next dose, take only that dose. Do not take double or extra doses. What may interact with this medicine? This medicine may interact with the following medications: -certain medicines for blood pressure, heart disease, irregular heart beat -certain medicines for depression, like fluoxetine or paroxetine -certain medicines for diabetes, like glipizide or glyburide -cimetidine -clonidine -cyclosporine -digoxin -MAOIs like Carbex, Eldepryl, Marplan, Nardil, and Parnate -reserpine -rifampin This list may not describe all possible interactions. Give your health care provider a list of all the medicines, herbs, non-prescription drugs, or dietary supplements you use. Also tell them if you smoke, drink alcohol, or use illegal drugs. Some items may interact with your medicine. What should I watch for while using this medicine? Check your heart rate and blood pressure regularly while you are taking this medicine. Ask your doctor or health care professional what your heart rate and blood pressure should be, and when you should contact him or her. Do not stop taking this medicine suddenly. This could lead to serious heart-related effects. Contact your doctor or health care professional if you have difficulty breathing while taking this drug. Check your weight daily. Ask your doctor or health  care professional when you should notify him/her of any weight gain. You may get drowsy or dizzy. Do not drive, use machinery, or do anything that requires mental  alertness until you know how this medicine affects you. To reduce the risk of dizzy or fainting spells, do not sit or stand up quickly. Alcohol can make you more drowsy, and increase flushing and rapid heartbeats. Avoid alcoholic drinks. If you have diabetes, check your blood sugar as directed. Tell your doctor if you have changes in your blood sugar while you are taking this medicine. If you are going to have surgery, tell your doctor or health care professional that you are taking this medicine. What side effects may I notice from receiving this medicine? Side effects that you should report to your doctor or health care professional as soon as possible: -allergic reactions like skin rash, itching or hives, swelling of the face, lips, or tongue -breathing problems -dark urine -irregular heartbeat -swollen legs or ankles -vomiting -yellowing of the eyes or skin Side effects that usually do not require medical attention (report to your doctor or health care professional if they continue or are bothersome): -change in sex drive or performance -diarrhea -dry eyes (especially if wearing contact lenses) -dry, itching skin -headache -nausea -unusually tired This list may not describe all possible side effects. Call your doctor for medical advice about side effects. You may report side effects to FDA at 1-800-FDA-1088. Where should I keep my medicine? Keep out of the reach of children. Store at room temperature below 30 degrees C (86 degrees F). Protect from moisture. Keep container tightly closed. Throw away any unused medicine after the expiration date. NOTE: This sheet is a summary. It may not cover all possible information. If you have questions about this medicine, talk to your doctor, pharmacist, or health care provider.    2016, Elsevier/Gold Standard. (2012-12-27 14:12:02)   Pharmacologic Stress Electrocardiogram A pharmacologic stress electrocardiogram is a heart (cardiac) test that  uses nuclear imaging to evaluate the blood supply to your heart. This test may also be called a pharmacologic stress electrocardiography. Pharmacologic means that a medicine is used to increase your heart rate and blood pressure.  This stress test is done to find areas of poor blood flow to the heart by determining the extent of coronary artery disease (CAD). Some people exercise on a treadmill, which naturally increases the blood flow to the heart. For those people unable to exercise on a treadmill, a medicine is used. This medicine stimulates your heart and will cause your heart to beat harder and more quickly, as if you were exercising.  Pharmacologic stress tests can help determine:  The adequacy of blood flow to your heart during increased levels of activity in order to clear you for discharge home.  The extent of coronary artery blockage caused by CAD.  Your prognosis if you have suffered a heart attack.  The effectiveness of cardiac procedures done, such as an angioplasty, which can increase the circulation in your coronary arteries.  Causes of chest pain or pressure. LET [facility] KNOW ABOUT:  Any allergies you have.  All medicines you are taking, including vitamins, herbs, eye drops, creams, and over-the-counter medicines.  Previous problems you or members of your family have had with the use of anesthetics.  Any blood disorders you have.  Previous surgeries you have had.  Medical conditions you have.  Possibility of pregnancy, if this applies.  If you are currently breastfeeding. RISKS AND  COMPLICATIONS Generally, this is a safe procedure. However, as with any procedure, complications can occur. Possible complications include:  You develop pain or pressure in the following areas:  Chest.  Jaw or neck.  Between your shoulder blades.  Radiating down your left arm.  Headache.  Dizziness or light-headedness.  Shortness of breath.  Increased or  irregular heartbeat.  Low blood pressure.  Nausea or vomiting.  Flushing.  Redness going up the arm and slight pain during injection of medicine.  Heart attack (rare). BEFORE THE PROCEDURE   Avoid all forms of caffeine for 24 hours before your test or as directed by your health care provider. This includes coffee, tea (even decaffeinated tea), caffeinated sodas, chocolate, cocoa, and certain pain medicines.  Follow your health care provider's instructions regarding eating and drinking before the test.  Take your medicines as directed at regular times with water unless instructed otherwise. Exceptions may include:  If you have diabetes, ask how you are to take your insulin or pills. It is common to adjust insulin dosing the morning of the test.  If you are taking beta-blocker medicines, it is important to talk to your health care provider about these medicines well before the date of your test. Taking beta-blocker medicines may interfere with the test. In some cases, these medicines need to be changed or stopped 24 hours or more before the test.  If you wear a nitroglycerin patch, it may need to be removed prior to the test. Ask your health care provider if the patch should be removed before the test.  If you use an inhaler for any breathing condition, bring it with you to the test.  If you are an outpatient, bring a snack so you can eat right after the stress phase of the test.  Do not smoke for 4 hours prior to the test or as directed by your health care provider.  Do not apply lotions, powders, creams, or oils on your chest prior to the test.  Wear comfortable shoes and clothing. Let your health care provider know if you were unable to complete or follow the preparations for your test. PROCEDURE   Multiple patches (electrodes) will be put on your chest. If needed, small areas of your chest may be shaved to get better contact with the electrodes. Once the electrodes are attached  to your body, multiple wires will be attached to the electrodes, and your heart rate will be monitored.  An IV access will be started. A nuclear trace (isotope) is given. The isotope may be given intravenously, or it may be swallowed. Nuclear refers to several types of radioactive isotopes, and the nuclear isotope lights up the arteries so that the nuclear images are clear. The isotope is absorbed by your body. This results in low radiation exposure.  A resting nuclear image is taken to show how your heart functions at rest.  A medicine is given through the IV access.  A second scan is done about 1 hour after the medicine injection and determines how your heart functions under stress.  During this stress phase, you will be connected to an electrocardiogram machine. Your blood pressure and oxygen levels will be monitored. AFTER THE PROCEDURE   Your heart rate and blood pressure will be monitored after the test.  You may return to your normal schedule, including diet,activities, and medicines, unless your health care provider tells you otherwise.   This information is not intended to replace advice given to you by your health  care provider. Make sure you discuss any questions you have with your health care provider.   Document Released: 09/08/2008 Document Revised: 04/27/2013 Document Reviewed: 12/28/2012 Elsevier Interactive Patient Education [employer]

## 2016-02-26 ENCOUNTER — Telehealth (HOSPITAL_COMMUNITY): Payer: Self-pay | Admitting: *Deleted

## 2016-02-26 NOTE — Telephone Encounter (Signed)
Patient given detailed instructions per Myocardial Perfusion Study Information Sheet for the test on 02/28/16 at 0715. Patient notified to arrive 15 minutes early and that it is imperative to arrive on time for appointment to keep from having the test rescheduled.  If you need to cancel or reschedule your appointment, please call the office within 24 hours of your appointment. Failure to do so may result in a cancellation of your appointment, and a $50 no show fee. Patient verbalized understanding.Kristin Schmidt, Luellen W    

## 2016-02-28 ENCOUNTER — Telehealth: Payer: Self-pay | Admitting: *Deleted

## 2016-02-28 ENCOUNTER — Ambulatory Visit (HOSPITAL_COMMUNITY): Payer: BLUE CROSS/BLUE SHIELD | Attending: Cardiovascular Disease

## 2016-02-28 ENCOUNTER — Ambulatory Visit: Payer: 59 | Admitting: Cardiology

## 2016-02-28 DIAGNOSIS — R079 Chest pain, unspecified: Secondary | ICD-10-CM | POA: Diagnosis not present

## 2016-02-28 DIAGNOSIS — I10 Essential (primary) hypertension: Secondary | ICD-10-CM | POA: Insufficient documentation

## 2016-02-28 DIAGNOSIS — R0609 Other forms of dyspnea: Secondary | ICD-10-CM | POA: Insufficient documentation

## 2016-02-28 LAB — MYOCARDIAL PERFUSION IMAGING
CHL CUP NUCLEAR SDS: 1
CSEPPHR: 98 {beats}/min
LV dias vol: 66 mL (ref 46–106)
LV sys vol: 15 mL
RATE: 0.33
Rest HR: 70 {beats}/min
SRS: 4
SSS: 5
TID: 0.97

## 2016-02-28 MED ORDER — TECHNETIUM TC 99M TETROFOSMIN IV KIT
10.9000 | PACK | Freq: Once | INTRAVENOUS | Status: AC | PRN
  Administered 2016-02-28: 10.9 via INTRAVENOUS
  Filled 2016-02-28: qty 11

## 2016-02-28 MED ORDER — TECHNETIUM TC 99M TETROFOSMIN IV KIT
32.6000 | PACK | Freq: Once | INTRAVENOUS | Status: AC | PRN
  Administered 2016-02-28: 32.6 via INTRAVENOUS
  Filled 2016-02-28: qty 33

## 2016-02-28 MED ORDER — REGADENOSON 0.4 MG/5ML IV SOLN
0.4000 mg | Freq: Once | INTRAVENOUS | Status: AC
  Administered 2016-02-28: 0.4 mg via INTRAVENOUS

## 2016-02-28 NOTE — Telephone Encounter (Signed)
Informed pt of stress test results. Pt verbalized understanding.  Pt warned me that her cell service was bad and she would call back if call dropped. While speaking with pt she asked if I could ask Dr. Elberta Fortisamnitz if he would be willing to decrease her Carvedilol.  Pt began speaking of her BP but was breaking up real bad and then call dropped.  Will await return call from pt.

## 2016-02-28 NOTE — Telephone Encounter (Signed)
-----   Message from Will Jorja LoaMartin Camnitz, MD sent at 02/28/2016  2:31 PM EDT ----- Low risk stress test

## 2016-02-29 MED ORDER — CARVEDILOL 25 MG PO TABS: 12.5000 mg | ORAL_TABLET | Freq: Two times a day (BID) | ORAL | 3 refills | Status: DC

## 2016-02-29 NOTE — Telephone Encounter (Signed)
Advised ok to decrease Carvedilol to 12.5 mg BID. She will call office if BP increases after decreasing med.

## 2016-02-29 NOTE — Telephone Encounter (Signed)
Called patient to follow up after not hearing back from her yesterday.   Reports BPs much better since switching to Carvedilol and adding Norvasc.  Reports SBPs averaging 100-110.  She does complain of weakness/fatigue.  States this was worse when she was on Metoprolol but remains an issue on Carvedilol. She would like to know if ok to try and reduce dosage to see if improvement in symptoms. She understands I will review with physician and call her with recommendations. She is agreeable to plan.

## 2016-05-30 ENCOUNTER — Encounter: Payer: Self-pay | Admitting: Cardiology

## 2016-05-30 ENCOUNTER — Ambulatory Visit (INDEPENDENT_AMBULATORY_CARE_PROVIDER_SITE_OTHER): Payer: BLUE CROSS/BLUE SHIELD | Admitting: Cardiology

## 2016-05-30 VITALS — BP 160/90 | HR 82 | Ht 62.0 in | Wt 156.4 lb

## 2016-05-30 DIAGNOSIS — I10 Essential (primary) hypertension: Secondary | ICD-10-CM | POA: Diagnosis not present

## 2016-05-30 MED ORDER — HYDROCHLOROTHIAZIDE 25 MG PO TABS: 25.0000 mg | ORAL_TABLET | Freq: Every day | ORAL | 3 refills | Status: DC

## 2016-05-30 MED ORDER — CARVEDILOL 6.25 MG PO TABS: 6.2500 mg | ORAL_TABLET | Freq: Two times a day (BID) | ORAL | 3 refills | Status: DC

## 2016-05-30 NOTE — Progress Notes (Signed)
Electrophysiology Office Note   Date:  05/30/2016   ID:  Kristin Schmidt, DOB 1955/12/06, MRN [id number]  PCP:  Eather Colas, FNP  Cardiologist:   Regan Lemming, MD    Chief Complaint  Patient presents with  . Follow-up    DOE Jonni Sanger presure     History of Present Illness: Kristin Schmidt is a 61 y.o. female who presents today for electrophysiology evaluation.   PMH of hypertension, atherosclerosis (underwent cardiac cath in [place] in 2007 and had renal artery stent placed, as well as a stent to her left subclavian), and 30 pack year smoking history. Presented to the ER with SOB and chest tightness. Myoview was low risk with a normal LVEF.She has had no further episodes of chest pain since her Myoview was performed. She says that her blood pressures at home generally ranged from the 120s to mid 130s on her home cuff. She has had a 6 pound weight gain since starting her medications, and she feels that is mainly due to fluid retention.   Today, she denies symptoms of palpitations, chest pain, shortness of breath, orthopnea, PND, lower extremity edema, claudication, dizziness, presyncope, syncope, bleeding, or neurologic sequela. The patient is tolerating medications without difficulties and is otherwise without complaint today.    Past Medical History:  Diagnosis Date  . Hypertension   . Renal disorder    Past Surgical History:  Procedure Laterality Date  . CESAREAN SECTION    . FRACTURE SURGERY     leg  . stent placement right kidney        Current Outpatient Prescriptions  Medication Sig Dispense Refill  . amLODipine (NORVASC) 5 MG tablet Take 1 tablet (5 mg total) by mouth daily. 30 tablet 6  . aspirin EC 81 MG tablet Take 81 mg by mouth daily.    . carvedilol (COREG) 25 MG tablet Take 0.5 tablets (12.5 mg total) by mouth 2 (two) times daily. 90 tablet 3  . losartan (COZAAR) 100 MG tablet Take 100 mg by mouth daily.    Marland Kitchen zolpidem (AMBIEN CR) 6.25 MG  CR tablet Take 6.25 mg by mouth at bedtime as needed for sleep.     No current facility-administered medications for this visit.     Allergies:   Patient has no known allergies.   Social History:  The patient  reports that she has been smoking Cigarettes.  She has never used smokeless tobacco. She reports that she drinks alcohol. She reports that she does not use drugs.   Family History:  The patient's family history includes COPD in her father and mother; Cancer in her sister; Heart attack in her maternal grandfather.    ROS:  Please see the history of present illness.   Otherwise, review of systems is positive for weight gain, rash.   All other systems are reviewed and negative.    PHYSICAL EXAM: VS:  BP (!) 160/90   Pulse 82   Ht 5\' 2"  (1.575 m)   Wt 156 lb 6.4 oz (70.9 kg)   BMI 28.61 kg/m  , BMI Body mass index is 28.61 kg/m. GEN: Well nourished, well developed, in no acute distress  HEENT: normal  Neck: no JVD, carotid bruits, or masses Cardiac: RRR; no murmurs, rubs, or gallops,no edema  Respiratory:  clear to auscultation bilaterally, normal work of breathing GI: soft, nontender, nondistended, + BS MS: no deformity or atrophy  Skin: warm and dry Neuro:  Strength and sensation are intact Psych: euthymic mood,  full affect  EKG:  EKG is not ordered today. Personal review of the ekg ordered 02/19/16 shows sinus rhythm, rate 78, early repolarization  Recent Labs: 02/18/2016: BUN 12; Creatinine, Ser 0.94; Hemoglobin 12.4; Platelets 325; Potassium 4.6; Sodium 136    Lipid Panel  No results found for: CHOL, TRIG, HDL, CHOLHDL, VLDL, LDLCALC, LDLDIRECT   Wt Readings from Last 3 Encounters:  05/30/16 156 lb 6.4 oz (70.9 kg)  02/28/16 151 lb (68.5 kg)  02/21/16 151 lb 3.2 oz (68.6 kg)      Other studies Reviewed: Additional studies/ records that were reviewed today include: SPECT 02/28/16  The left ventricular ejection fraction is hyperdynamic (>65%).  Nuclear  stress EF: 77%.  There was no ST segment deviation noted during stress.  The study is normal.  This is a low risk study.       ASSESSMENT AND PLAN:  1.  Hypertension: elevated today. She has been having quite a bit of swelling and a 6 pound weight gain. Due to that, we'll stop her Norvasc and start her on HCTZ. She is also been having significant amounts of fatigue, we will therefore decrease her carvedilol to 6.25 mg. Should her blood pressure remain elevated, we'll potentially need to add an alpha blocker to her regimen. She will call the clinic in 2 weeks with blood pressure measurements from home.  2. Hyperlipidemia: Has declined therapy in the past  Current medicines are reviewed at length with the patient today.   The patient does not have concerns regarding her medicines.  The following changes were made today:   Stop Norvasc, start HCTZ, decrease carvedilol  Labs/ tests ordered today include:  No orders of the defined types were placed in this encounter.    Disposition:   FU with Will Camnitz 3 months  Signed, Will Jorja LoaMartin Camnitz, MD  05/30/2016 4:32 PM     [facility] [address] [place] [address] [phone] (office) [phone] (fax)

## 2016-05-30 NOTE — Patient Instructions (Signed)
Medication Instructions:    Your physician has recommended you make the following change in your medication: 1) DECREASE Carvedilol to 6.25 mg twice a day 2) STOP Amlodipine 3) START Hydrochlorothiazide 25 mg once daily  --- If you need a refill on your cardiac medications before your next appointment, please call your pharmacy. ---  Labwork:  None ordered  Testing/Procedures:  None ordered  Follow-Up:  Your physician recommends that you schedule a follow-up appointment in: 3 months with Dr. Elberta Fortisamnitz.  Thank you for choosing [facility]!!   Dory HornSherri Kitiara Hintze, RN [phone]   Any Other Special Instructions Will Be Listed Below (If Applicable).  Please keep track of your blood pressure.  Call Meilyn Heindl, RN in several weeks to let her know what your readings are.   Hydrochlorothiazide, HCTZ capsules or tablets What is this medicine? HYDROCHLOROTHIAZIDE (hye droe klor oh THYE a zide) is a diuretic. It increases the amount of urine passed, which causes the body to lose salt and water. This medicine is used to treat high blood pressure. It is also reduces the swelling and water retention caused by various medical conditions, such as heart, liver, or kidney disease. This medicine may be used for other purposes; ask your health care provider or pharmacist if you have questions. COMMON BRAND NAME(S): Esidrix, Ezide, HydroDIURIL, Microzide, Oretic, Zide What should I tell my health care provider before I take this medicine? They need to know if you have any of these conditions: -diabetes -gout -immune system problems, like lupus -kidney disease or kidney stones -liver disease -pancreatitis -small amount of urine or difficulty passing urine -an unusual or allergic reaction to hydrochlorothiazide, sulfa drugs, other medicines, foods, dyes, or preservatives -pregnant or trying to get pregnant -breast-feeding How should I use this medicine? Take this medicine by mouth with a glass  of water. Follow the directions on the prescription label. Take your medicine at regular intervals. Remember that you will need to pass urine frequently after taking this medicine. Do not take your doses at a time of day that will cause you problems. Do not stop taking your medicine unless your doctor tells you to. Talk to your pediatrician regarding the use of this medicine in children. Special care may be needed. Overdosage: If you think you have taken too much of this medicine contact a poison control center or emergency room at once. NOTE: This medicine is only for you. Do not share this medicine with others. What if I miss a dose? If you miss a dose, take it as soon as you can. If it is almost time for your next dose, take only that dose. Do not take double or extra doses. What may interact with this medicine? -cholestyramine -colestipol -digoxin -dofetilide -lithium -medicines for blood pressure -medicines for diabetes -medicines that relax muscles for surgery -other diuretics -steroid medicines like prednisone or cortisone This list may not describe all possible interactions. Give your health care provider a list of all the medicines, herbs, non-prescription drugs, or dietary supplements you use. Also tell them if you smoke, drink alcohol, or use illegal drugs. Some items may interact with your medicine. What should I watch for while using this medicine? Visit your doctor or health care professional for regular checks on your progress. Check your blood pressure as directed. Ask your doctor or health care professional what your blood pressure should be and when you should contact him or her. You may need to be on a special diet while taking this medicine. Ask  your doctor. Check with your doctor or health care professional if you get an attack of severe diarrhea, nausea and vomiting, or if you sweat a lot. The loss of too much body fluid can make it dangerous for you to take this  medicine. You may get drowsy or dizzy. Do not drive, use machinery, or do anything that needs mental alertness until you know how this medicine affects you. Do not stand or sit up quickly, especially if you are an older patient. This reduces the risk of dizzy or fainting spells. Alcohol may interfere with the effect of this medicine. Avoid alcoholic drinks. This medicine may affect your blood sugar level. If you have diabetes, check with your doctor or health care professional before changing the dose of your diabetic medicine. This medicine can make you more sensitive to the sun. Keep out of the sun. If you cannot avoid being in the sun, wear protective clothing and use sunscreen. Do not use sun lamps or tanning beds/booths. What side effects may I notice from receiving this medicine? Side effects that you should report to your doctor or health care professional as soon as possible: -allergic reactions such as skin rash or itching, hives, swelling of the lips, mouth, tongue, or throat -changes in vision -chest pain -eye pain -fast or irregular heartbeat -feeling faint or lightheaded, falls -gout attack -muscle pain or cramps -pain or difficulty when passing urine -pain, tingling, numbness in the hands or feet -redness, blistering, peeling or loosening of the skin, including inside the mouth -unusually weak or tired Side effects that usually do not require medical attention (report to your doctor or health care professional if they continue or are bothersome): -change in sex drive or performance -dry mouth -headache -stomach upset This list may not describe all possible side effects. Call your doctor for medical advice about side effects. You may report side effects to FDA at 1-800-FDA-1088. Where should I keep my medicine? Keep out of the reach of children. Store at room temperature between 15 and 30 degrees C (59 and 86 degrees F). Do not freeze. Protect from light and moisture. Keep  container closed tightly. Throw away any unused medicine after the expiration date. NOTE: This sheet is a summary. It may not cover all possible information. If you have questions about this medicine, talk to your doctor, pharmacist, or health care provider.  2017 Elsevier/Gold Standard (2009-12-15 12:57:37)

## 2016-05-30 NOTE — Addendum Note (Signed)
Addended by: Baird LyonsPRICE, Tahjai Schetter L on: 05/30/2016 04:46 PM   Modules accepted: Orders

## 2016-09-04 ENCOUNTER — Ambulatory Visit (INDEPENDENT_AMBULATORY_CARE_PROVIDER_SITE_OTHER): Payer: PRIVATE HEALTH INSURANCE | Admitting: Cardiology

## 2016-09-04 VITALS — BP 132/72 | HR 94 | Ht 62.0 in | Wt 154.2 lb

## 2016-09-04 DIAGNOSIS — Z79899 Other long term (current) drug therapy: Secondary | ICD-10-CM | POA: Diagnosis not present

## 2016-09-04 DIAGNOSIS — I10 Essential (primary) hypertension: Secondary | ICD-10-CM | POA: Diagnosis not present

## 2016-09-04 MED ORDER — FUROSEMIDE 20 MG PO TABS: 20.0000 mg | ORAL_TABLET | Freq: Every day | ORAL | 6 refills | Status: DC

## 2016-09-04 MED ORDER — AMLODIPINE BESYLATE 5 MG PO TABS: 5.0000 mg | ORAL_TABLET | Freq: Every day | ORAL | 6 refills | Status: DC

## 2016-09-04 NOTE — Patient Instructions (Addendum)
Medication Instructions:   Your physician has recommended you make the following change in your medication:  1) STOP Carvedilol 2) START Amlodipine 5 mg once daily 3) STOP Hydrochlorothiazide 4) START Furosemide (lasix) 20 mg once daily  Labwork:  None ordered  Testing/Procedures:  None ordered  Follow-Up:  Your physician wants you to follow-up in: 6 months with Dr. Curt Bears.  You will receive a reminder letter in the mail two months in advance. If you don't receive a letter, please call our office to schedule the follow-up appointment.  - If you need a refill on your cardiac medications before your next appointment, please call your pharmacy.   Thank you for choosing [facility]!!   Trinidad Curet, RN [phone]   Any Other Special Instructions Will Be Listed Below (If Applicable).  Amlodipine tablets What is this medicine? AMLODIPINE (am LOE di peen) is a calcium-channel blocker. It affects the amount of calcium found in your heart and muscle cells. This relaxes your blood vessels, which can reduce the amount of work the heart has to do. This medicine is used to lower high blood pressure. It is also used to prevent chest pain. This medicine may be used for other purposes; ask your health care provider or pharmacist if you have questions. COMMON BRAND NAME(S): Norvasc What should I tell my health care provider before I take this medicine? They need to know if you have any of these conditions: -heart problems like heart failure or aortic stenosis -liver disease -an unusual or allergic reaction to amlodipine, other medicines, foods, dyes, or preservatives -pregnant or trying to get pregnant -breast-feeding How should I use this medicine? Take this medicine by mouth with a glass of water. Follow the directions on the prescription label. Take your medicine at regular intervals. Do not take more medicine than directed. Talk to your pediatrician regarding the use of this  medicine in children. Special care may be needed. This medicine has been used in children as young as 6. Persons over 76 years old may have a stronger reaction to this medicine and need smaller doses. Overdosage: If you think you have taken too much of this medicine contact a poison control center or emergency room at once. NOTE: This medicine is only for you. Do not share this medicine with others. What if I miss a dose? If you miss a dose, take it as soon as you can. If it is almost time for your next dose, take only that dose. Do not take double or extra doses. What may interact with this medicine? -herbal or dietary supplements -local or general anesthetics -medicines for high blood pressure -medicines for prostate problems -rifampin This list may not describe all possible interactions. Give your health care provider a list of all the medicines, herbs, non-prescription drugs, or dietary supplements you use. Also tell them if you smoke, drink alcohol, or use illegal drugs. Some items may interact with your medicine. What should I watch for while using this medicine? Visit your doctor or health care professional for regular check ups. Check your blood pressure and pulse rate regularly. Ask your health care professional what your blood pressure and pulse rate should be, and when you should contact him or her. This medicine may make you feel confused, dizzy or lightheaded. Do not drive, use machinery, or do anything that needs mental alertness until you know how this medicine affects you. To reduce the risk of dizzy or fainting spells, do not sit or stand up quickly,  especially if you are an older patient. Avoid alcoholic drinks; they can make you more dizzy. Do not suddenly stop taking amlodipine. Ask your doctor or health care professional how you can gradually reduce the dose. What side effects may I notice from receiving this medicine? Side effects that you should report to your doctor or  health care professional as soon as possible: -allergic reactions like skin rash, itching or hives, swelling of the face, lips, or tongue -breathing problems -changes in vision or hearing -chest pain -fast, irregular heartbeat -swelling of legs or ankles Side effects that usually do not require medical attention (report to your doctor or health care professional if they continue or are bothersome): -dry mouth -facial flushing -nausea, vomiting -stomach gas, pain -tired, weak -trouble sleeping This list may not describe all possible side effects. Call your doctor for medical advice about side effects. You may report side effects to FDA at 1-800-FDA-1088. Where should I keep my medicine? Keep out of the reach of children. Store at room temperature between 59 and 86 degrees F (15 and 30 degrees C). Protect from light. Keep container tightly closed. Throw away any unused medicine after the expiration date. NOTE: This sheet is a summary. It may not cover all possible information. If you have questions about this medicine, talk to your doctor, pharmacist, or health care provider.  2018 Elsevier/Gold Standard (2012-03-20 11:40:58)   Furosemide tablets What is this medicine? FUROSEMIDE (fyoor [place] se mide) is a diuretic. It helps you make more urine and to lose salt and excess water from your body. This medicine is used to treat high blood pressure, and edema or swelling from heart, kidney, or liver disease. This medicine may be used for other purposes; ask your health care provider or pharmacist if you have questions. COMMON BRAND NAME(S): Active-Medicated Specimen Kit, Delone, Diuscreen, Lasix, RX Specimen Collection Kit, Specimen Collection Kit, URINX Medicated Specimen Collection What should I tell my health care provider before I take this medicine? They need to know if you have any of these conditions: -abnormal blood electrolytes -diarrhea or vomiting -gout -heart disease -kidney  disease, small amounts of urine, or difficulty passing urine -liver disease -thyroid disease -an unusual or allergic reaction to furosemide, sulfa drugs, other medicines, foods, dyes, or preservatives -pregnant or trying to get pregnant -breast-feeding How should I use this medicine? Take this medicine by mouth with a glass of water. Follow the directions on the prescription label. You may take this medicine with or without food. If it upsets your stomach, take it with food or milk. Do not take your medicine more often than directed. Remember that you will need to pass more urine after taking this medicine. Do not take your medicine at a time of day that will cause you problems. Do not take at bedtime. Talk to your pediatrician regarding the use of this medicine in children. While this drug may be prescribed for selected conditions, precautions do apply. Overdosage: If you think you have taken too much of this medicine contact a poison control center or emergency room at once. NOTE: This medicine is only for you. Do not share this medicine with others. What if I miss a dose? If you miss a dose, take it as soon as you can. If it is almost time for your next dose, take only that dose. Do not take double or extra doses. What may interact with this medicine? -aspirin and aspirin-like medicines -certain antibiotics -chloral hydrate -cisplatin -cyclosporine -digoxin -diuretics -laxatives -  lithium -medicines for blood pressure -medicines that relax muscles for surgery -methotrexate -NSAIDs, medicines for pain and inflammation like ibuprofen, naproxen, or indomethacin -phenytoin -steroid medicines like prednisone or cortisone -sucralfate -thyroid hormones This list may not describe all possible interactions. Give your health care provider a list of all the medicines, herbs, non-prescription drugs, or dietary supplements you use. Also tell them if you smoke, drink alcohol, or use illegal drugs.  Some items may interact with your medicine. What should I watch for while using this medicine? Visit your doctor or health care professional for regular checks on your progress. Check your blood pressure regularly. Ask your doctor or health care professional what your blood pressure should be, and when you should contact him or her. If you are a diabetic, check your blood sugar as directed. You may need to be on a special diet while taking this medicine. Check with your doctor. Also, ask how many glasses of fluid you need to drink a day. You must not get dehydrated. You may get drowsy or dizzy. Do not drive, use machinery, or do anything that needs mental alertness until you know how this drug affects you. Do not stand or sit up quickly, especially if you are an older patient. This reduces the risk of dizzy or fainting spells. Alcohol can make you more drowsy and dizzy. Avoid alcoholic drinks. This medicine can make you more sensitive to the sun. Keep out of the sun. If you cannot avoid being in the sun, wear protective clothing and use sunscreen. Do not use sun lamps or tanning beds/booths. What side effects may I notice from receiving this medicine? Side effects that you should report to your doctor or health care professional as soon as possible: -blood in urine or stools -dry mouth -fever or chills -hearing loss or ringing in the ears -irregular heartbeat -muscle pain or weakness, cramps -skin rash -stomach upset, pain, or nausea -tingling or numbness in the hands or feet -unusually weak or tired -vomiting or diarrhea -yellowing of the eyes or skin Side effects that usually do not require medical attention (report to your doctor or health care professional if they continue or are bothersome): -headache -loss of appetite -unusual bleeding or bruising This list may not describe all possible side effects. Call your doctor for medical advice about side effects. You may report side effects to  FDA at 1-800-FDA-1088. Where should I keep my medicine? Keep out of the reach of children. Store at room temperature between 15 and 30 degrees C (59 and 86 degrees F). Protect from light. Throw away any unused medicine after the expiration date. NOTE: This sheet is a summary. It may not cover all possible information. If you have questions about this medicine, talk to your doctor, pharmacist, or health care provider.  2018 Elsevier/Gold Standard (2014-07-13 13:49:50)

## 2016-09-04 NOTE — Progress Notes (Signed)
Electrophysiology Office Note   Date:  09/04/2016   ID:  Kristin Schmidt, DOB 1956-04-09, MRN [id number]  PCP:  Eather Colas, FNP  Cardiologist:   Regan Lemming, MD    Chief Complaint  Patient presents with  . Coronary Artery Disease  . Hypertension     History of Present Illness: Kristin Schmidt is a 61 y.o. female who presents today for electrophysiology evaluation.   PMH of hypertension, atherosclerosis (underwent cardiac cath in [place] in 2007 and had renal artery stent placed, as well as a stent to her left subclavian), and 30 pack year smoking history. Presented to the ER with SOB and chest tightness. Myoview was low risk with a normal LVEF.She has had no further episodes of chest pain since her Myoview was performed.   She is continuing to have weight gain. She says that she takes her HCTZ in the mornings at 6 AM, but by lunch time she feels that her abdomen is getting swollen. She does not note lower extremity edema. She does not note any chest pain. Since she started her beta blockers, she feels like she has gained up to 15 pounds and is having difficulty losing the weight. She is other 5 feeling well without any major complaint. She has no palpitations, chest pain, shortness of breath, PND, orthopnea, lower extremity edema, dizziness, syncope, or presyncope.   Past Medical History:  Diagnosis Date  . Hypertension   . Renal disorder    Past Surgical History:  Procedure Laterality Date  . CESAREAN SECTION    . FRACTURE SURGERY     leg  . stent placement right kidney        Current Outpatient Prescriptions  Medication Sig Dispense Refill  . aspirin EC 81 MG tablet Take 81 mg by mouth daily.    Marland Kitchen losartan (COZAAR) 100 MG tablet Take 100 mg by mouth daily.    Marland Kitchen zolpidem (AMBIEN CR) 6.25 MG CR tablet Take 6.25 mg by mouth at bedtime as needed for sleep.    Marland Kitchen amLODipine (NORVASC) 5 MG tablet Take 1 tablet (5 mg total) by mouth daily. 30 tablet 6  .  furosemide (LASIX) 20 MG tablet Take 1 tablet (20 mg total) by mouth daily. 30 tablet 6   No current facility-administered medications for this visit.     Allergies:   Patient has no known allergies.   Social History:  The patient  reports that she has been smoking Cigarettes.  She has never used smokeless tobacco. She reports that she drinks alcohol. She reports that she does not use drugs.   Family History:  The patient's family history includes COPD in her father and mother; Cancer in her sister; Heart attack in her maternal grandfather.    ROS:  Please see the history of present illness.   Otherwise, review of systems is positive for none.   All other systems are reviewed and negative.   PHYSICAL EXAM: VS:  BP 132/72   Pulse 94   Ht  (1.575 m)   Wt 154 lb 4 oz (70 kg)   SpO2 97%   BMI 28.21 kg/m  , BMI Body mass index is 28.21 kg/m. GEN: Well nourished, well developed, in no acute distress  HEENT: normal  Neck: no JVD, carotid bruits, or masses Cardiac: RRR; no murmurs, rubs, or gallops,no edema  Respiratory:  clear to auscultation bilaterally, normal work of breathing GI: soft, nontender, nondistended, + BS MS: no deformity or atrophy  Skin:  warm and dry,  Neuro:  Strength and sensation are intact Psych: euthymic mood, full affect   EKG:  EKG is not ordered today. Personal review of the ekg ordered 02/17/17 shows sinus rhythm, rate 78  Recent Labs: 02/18/2016: BUN 12; Creatinine, Ser 0.94; Hemoglobin 12.4; Platelets 325; Potassium 4.6; Sodium 136    Lipid Panel  No results found for: CHOL, TRIG, HDL, CHOLHDL, VLDL, LDLCALC, LDLDIRECT   Wt Readings from Last 3 Encounters:  09/04/16 154 lb 4 oz (70 kg)  05/30/16 156 lb 6.4 oz (70.9 kg)  02/28/16 151 lb (68.5 kg)      Other studies Reviewed: Additional studies/ records that were reviewed today include: SPECT 02/28/16  The left ventricular ejection fraction is hyperdynamic (>65%).  Nuclear stress EF:  77%.  There was no ST segment deviation noted during stress.  The study is normal.  This is a low risk study.       ASSESSMENT AND PLAN:  1.  Hypertension: Blood pressure is well-controlled today, she is worried about swelling and weight gain. She feels like her weight gain is due to her beta blockers. We'll therefore stop her carvedilol and start her on Norvasc 5 mg. 4 swelling, we'll start her on Lasix 20 mg and stop her HCTZ.  2. Hyperlipidemia: Continues to decline statin therapy. We'll check her lipids in 2 weeks fasting.  Current medicines are reviewed at length with the patient today.   The patient does not have concerns regarding her medicines.  The following changes were made today:   Stop HCTZ, stop carvedilol, start amlodipine, start Lasix  Labs/ tests ordered today include:  Orders Placed This Encounter  Procedures  . Basic Metabolic Panel (BMET)     Disposition:   FU with Miyanna Wiersma 6 months  Signed, Madellyn Denio Jorja Loa, MD  09/04/2016 4:31 PM     [facility] [address] [place] [address] [phone] (office) [phone] (fax)

## 2016-09-18 ENCOUNTER — Other Ambulatory Visit: Payer: PRIVATE HEALTH INSURANCE | Admitting: *Deleted

## 2016-09-18 DIAGNOSIS — Z79899 Other long term (current) drug therapy: Secondary | ICD-10-CM

## 2016-09-18 LAB — BASIC METABOLIC PANEL
BUN / CREAT RATIO: 29 — AB (ref 12–28)
BUN: 21 mg/dL (ref 8–27)
CO2: 22 mmol/L (ref 18–29)
Calcium: 9.5 mg/dL (ref 8.7–10.3)
Chloride: 99 mmol/L (ref 96–106)
Creatinine, Ser: 0.72 mg/dL (ref 0.57–1.00)
GFR, EST AFRICAN AMERICAN: 104 mL/min/{1.73_m2} (ref >59)
GFR, EST NON AFRICAN AMERICAN: 90 mL/min/{1.73_m2} (ref >59)
Glucose: 107 mg/dL — ABNORMAL HIGH (ref 65–99)
POTASSIUM: 4.2 mmol/L (ref 3.5–5.2)
SODIUM: 139 mmol/L (ref 134–144)

## 2017-02-11 ENCOUNTER — Encounter (HOSPITAL_BASED_OUTPATIENT_CLINIC_OR_DEPARTMENT_OTHER): Payer: Self-pay | Admitting: Emergency Medicine

## 2017-02-11 ENCOUNTER — Emergency Department (HOSPITAL_BASED_OUTPATIENT_CLINIC_OR_DEPARTMENT_OTHER)
Admission: EM | Admit: 2017-02-11 | Discharge: 2017-02-11 | Disposition: A | Discharge status: Home / Self Care | Payer: Worker's Compensation | Attending: Emergency Medicine | Admitting: Emergency Medicine

## 2017-02-11 ENCOUNTER — Emergency Department (HOSPITAL_BASED_OUTPATIENT_CLINIC_OR_DEPARTMENT_OTHER): Payer: Worker's Compensation

## 2017-02-11 DIAGNOSIS — Y939 Activity, unspecified: Secondary | ICD-10-CM | POA: Insufficient documentation

## 2017-02-11 DIAGNOSIS — I10 Essential (primary) hypertension: Secondary | ICD-10-CM | POA: Diagnosis not present

## 2017-02-11 DIAGNOSIS — S4992XA Unspecified injury of left shoulder and upper arm, initial encounter: Secondary | ICD-10-CM | POA: Diagnosis present

## 2017-02-11 DIAGNOSIS — Y990 Civilian activity done for income or pay: Secondary | ICD-10-CM | POA: Diagnosis not present

## 2017-02-11 DIAGNOSIS — Y9289 Other specified places as the place of occurrence of the external cause: Secondary | ICD-10-CM | POA: Insufficient documentation

## 2017-02-11 DIAGNOSIS — S42295A Other nondisplaced fracture of upper end of left humerus, initial encounter for closed fracture: Secondary | ICD-10-CM | POA: Diagnosis not present

## 2017-02-11 DIAGNOSIS — F17210 Nicotine dependence, cigarettes, uncomplicated: Secondary | ICD-10-CM | POA: Insufficient documentation

## 2017-02-11 DIAGNOSIS — W19XXXA Unspecified fall, initial encounter: Secondary | ICD-10-CM | POA: Insufficient documentation

## 2017-02-11 DIAGNOSIS — Z79899 Other long term (current) drug therapy: Secondary | ICD-10-CM | POA: Diagnosis not present

## 2017-02-11 DIAGNOSIS — Z7982 Long term (current) use of aspirin: Secondary | ICD-10-CM | POA: Insufficient documentation

## 2017-02-11 MED ORDER — ONDANSETRON 4 MG PO TBDP: 4.0000 mg | ORAL_TABLET | Freq: Three times a day (TID) | ORAL | 1 refills | Status: DC | PRN

## 2017-02-11 MED ORDER — HYDROCODONE-ACETAMINOPHEN 5-325 MG PO TABS
1.0000 | ORAL_TABLET | Freq: Once | ORAL | Status: AC
  Administered 2017-02-11: 1 via ORAL
  Filled 2017-02-11: qty 1

## 2017-02-11 MED ORDER — HYDROCODONE-ACETAMINOPHEN 5-325 MG PO TABS: 1.0000 | ORAL_TABLET | Freq: Four times a day (QID) | ORAL | 0 refills | Status: DC | PRN

## 2017-02-11 MED FILL — HYDROCODON-APAP 5-325: 5-325 | 2 days supply | Qty: 20 | Fill #0

## 2017-02-11 MED FILL — ONDANSETRON ODT 4 MG TABLET: 4 | 3 days supply | Qty: 10 | Fill #0

## 2017-02-11 NOTE — ED Provider Notes (Signed)
[facility] Provider Note   CSN: [id number] Arrival date & time: 02/11/17  1343     History   Chief Complaint Chief Complaint  Patient presents with  . Shoulder Injury    HPI Fallen Kristin is a 61 y.o. female.  Patient with fall at work. Landed on left shoulder. No loss of consciousness. Pain to left shoulder area. Denies any other injuries. Patient is right hand dominant. Patient did not hit her head denies any neck pain or low back pain or any other extremity pain. No chest pain no abdominal pain.      Past Medical History:  Diagnosis Date  . Hypertension   . Renal disorder     Patient Active Problem List   Diagnosis Date Noted  . Hyperlipidemia 12/13/2015  . Essential hypertension 09/11/2015    Past Surgical History:  Procedure Laterality Date  . CESAREAN SECTION    . FRACTURE SURGERY     leg  . stent placement right kidney       OB History    No data available       Home Medications    Prior to Admission medications   Medication Sig Start Date End Date Taking? Authorizing Provider  amLODipine (NORVASC) 5 MG tablet Take 1 tablet (5 mg total) by mouth daily. 09/04/16 12/03/16  Camnitz, Andree Coss, MD  aspirin EC 81 MG tablet Take 81 mg by mouth daily.    [provider]  furosemide (LASIX) 20 MG tablet Take 1 tablet (20 mg total) by mouth daily. 09/04/16   Camnitz, Andree Coss, MD  HYDROcodone-acetaminophen (NORCO/VICODIN) 5-325 MG tablet Take 1-2 tablets by mouth every 6 (six) hours as needed for moderate pain. 02/11/17   Vanetta Mulders, MD  losartan (COZAAR) 100 MG tablet Take 100 mg by mouth daily.    [provider]  zolpidem (AMBIEN CR) 6.25 MG CR tablet Take 6.25 mg by mouth at bedtime as needed for sleep.    [provider]    Family History Family History  Problem Relation Age of Onset  . COPD Mother   . COPD Father   . Heart attack Maternal Grandfather   . Cancer Sister     Social  History Social History  Substance Use Topics  . Smoking status: Current Some Day Smoker    Types: Cigarettes  . Smokeless tobacco: Never Used  . Alcohol use Yes     Comment: weekends     Allergies   Patient has no known allergies.   Review of Systems Review of Systems  Constitutional: Negative for fever.  HENT: Negative for congestion.   Eyes: Negative for redness.  Respiratory: Negative for shortness of breath.   Gastrointestinal: Negative for abdominal distention and nausea.  Genitourinary: Negative for dysuria.  Musculoskeletal: Positive for joint swelling. Negative for back pain and neck pain.  Skin: Negative for rash and wound.  Neurological: Negative for headaches.  Hematological: Does not bruise/bleed easily.  Psychiatric/Behavioral: Negative for confusion.     Physical Exam Updated Vital Signs BP (!) 160/65 (BP Location: Right Arm)   Pulse (!) 109   Temp 97.9 F (36.6 C) (Oral)   Resp 18   Ht 1.575 m ( )   Wt 68 kg (150 lb)   SpO2 99%   BMI 27.44 kg/m   Physical Exam  Constitutional: She is oriented to person, place, and time. She appears well-developed and well-nourished. No distress.  HENT:  Head: Normocephalic and atraumatic.  Mouth/Throat: Oropharynx is  clear and moist.  Eyes: Pupils are equal, round, and reactive to light. Conjunctivae and EOM are normal.  Neck: Normal range of motion. Neck supple.  Cardiovascular: Normal rate, regular rhythm and normal heart sounds.   Pulmonary/Chest: Effort normal and breath sounds normal. No respiratory distress.  Abdominal: Soft. Bowel sounds are normal. There is no tenderness.  Musculoskeletal: Normal range of motion. She exhibits tenderness.  Tenderness to palpation to proximal humerus. No evidence of any dislocation. Radial pulse distally 2+. No injury to the wrist or elbow forearm area. Patient holding arm close to her body. No numbness in the fingers.  Neurological: She is alert and oriented to person,  place, and time. No cranial nerve deficit or sensory deficit. She exhibits normal muscle tone. Coordination normal.  Skin: Skin is warm.  Nursing note and vitals reviewed.    ED Treatments / Results  Labs (all labs ordered are listed, but only abnormal results are displayed) Labs Reviewed - No data to display  EKG  EKG Interpretation None       Radiology Dg Shoulder Left  Result Date: 02/11/2017 CLINICAL DATA:  Fall today with shoulder pain.  Initial encounter. EXAM: LEFT SHOULDER - 2+ VIEW COMPARISON:  None. FINDINGS: Transverse fracture across the surgical neck of the humerus with up to 3 mm of anterior displacement and 7 mm of posterior impaction. There is fragmentation of the greater tuberosity with of to 3 mm of offset when measured superiorly. No dislocation. Normal alignment at the acromioclavicular joint. Great vessels stent, likely subclavian artery. IMPRESSION: Humeral neck and greater tuberosity fracture with displacement described above. Electronically Signed   By: Marnee Spring M.D.   On: 02/11/2017 14:18    Procedures Procedures (including critical care time)  Medications Ordered in ED Medications - No data to display   Initial Impression / Assessment and Plan / ED Course  I have reviewed the triage vital signs and the nursing notes.  Pertinent labs & imaging results that were available during my care of the patient were reviewed by me and considered in my medical decision making (see chart for details).    X-rays consistent with proximal humerus fracture and greater tuberosity fracture. Will treat with sling and have her follow-up with orthopedics and pain medicine. No other injuries.   Final Clinical Impressions(s) / ED Diagnoses   Final diagnoses:  Other closed nondisplaced fracture of proximal end of left humerus, initial encounter    New Prescriptions New Prescriptions   HYDROCODONE-ACETAMINOPHEN (NORCO/VICODIN) 5-325 MG TABLET    Take 1-2 tablets  by mouth every 6 (six) hours as needed for moderate pain.     Vanetta Mulders, MD 02/11/17 [phone]

## 2017-02-11 NOTE — Discharge Instructions (Signed)
Take pain medicine as directed. Keep the sling on at all times except for showering.: Orthopedics for follow-up appointment. Return for any new or worse symptoms. Work note provided.

## 2017-02-11 NOTE — ED Notes (Signed)
Given soda and crackers, unable to tolerate the sling to left shoulder, will attempt again in 30 min or so.

## 2017-02-11 NOTE — ED Triage Notes (Signed)
Patient states that she tripped and fell at work. The patient reports that she has never felt pain like this. THe patient reports that she is having left shoulder pain and is unable to move her left elbow. Patient has supervisor with her [facility]) and reports that she does not need a UDS

## 2017-02-19 ENCOUNTER — Other Ambulatory Visit (HOSPITAL_COMMUNITY): Payer: Self-pay | Admitting: *Deleted

## 2017-02-19 ENCOUNTER — Encounter (HOSPITAL_COMMUNITY)
Admission: RE | Admit: 2017-02-19 | Discharge: 2017-02-19 | Disposition: A | Discharge status: Home / Self Care | Payer: Worker's Compensation | Source: Ambulatory Visit | Attending: Orthopedic Surgery | Admitting: Orthopedic Surgery

## 2017-02-19 ENCOUNTER — Encounter (HOSPITAL_COMMUNITY): Payer: Self-pay

## 2017-02-19 DIAGNOSIS — I2510 Atherosclerotic heart disease of native coronary artery without angina pectoris: Secondary | ICD-10-CM | POA: Diagnosis not present

## 2017-02-19 DIAGNOSIS — Y9289 Other specified places as the place of occurrence of the external cause: Secondary | ICD-10-CM | POA: Diagnosis not present

## 2017-02-19 DIAGNOSIS — X58XXXA Exposure to other specified factors, initial encounter: Secondary | ICD-10-CM | POA: Diagnosis not present

## 2017-02-19 DIAGNOSIS — Z87891 Personal history of nicotine dependence: Secondary | ICD-10-CM | POA: Diagnosis not present

## 2017-02-19 DIAGNOSIS — Z79899 Other long term (current) drug therapy: Secondary | ICD-10-CM | POA: Diagnosis not present

## 2017-02-19 DIAGNOSIS — Z791 Long term (current) use of non-steroidal anti-inflammatories (NSAID): Secondary | ICD-10-CM | POA: Diagnosis not present

## 2017-02-19 DIAGNOSIS — S42239A 3-part fracture of surgical neck of unspecified humerus, initial encounter for closed fracture: Secondary | ICD-10-CM | POA: Diagnosis not present

## 2017-02-19 DIAGNOSIS — Z79891 Long term (current) use of opiate analgesic: Secondary | ICD-10-CM | POA: Diagnosis not present

## 2017-02-19 DIAGNOSIS — I10 Essential (primary) hypertension: Secondary | ICD-10-CM | POA: Diagnosis not present

## 2017-02-19 HISTORY — DX: Atherosclerotic heart disease of native coronary artery without angina pectoris: I25.10

## 2017-02-19 HISTORY — DX: Pneumonia, unspecified organism: J18.9

## 2017-02-19 HISTORY — DX: Tachycardia, unspecified: R00.0

## 2017-02-19 LAB — CBC
HCT: 36.4 % (ref 36.0–46.0)
HEMOGLOBIN: 12.1 g/dL (ref 12.0–15.0)
MCH: 33.3 pg (ref 26.0–34.0)
MCHC: 33.2 g/dL (ref 30.0–36.0)
MCV: 100.3 fL — ABNORMAL HIGH (ref 78.0–100.0)
Platelets: ADEQUATE 10*3/uL (ref 150–400)
RBC: 3.63 MIL/uL — ABNORMAL LOW (ref 3.87–5.11)
RDW: 13.4 % (ref 11.5–15.5)
WBC: 8.6 10*3/uL (ref 4.0–10.5)

## 2017-02-19 LAB — BASIC METABOLIC PANEL
Anion gap: 9 (ref 5–15)
BUN: 26 mg/dL — ABNORMAL HIGH (ref 6–20)
CALCIUM: 9.1 mg/dL (ref 8.9–10.3)
CO2: 22 mmol/L (ref 22–32)
Chloride: 107 mmol/L (ref 101–111)
Creatinine, Ser: 1.03 mg/dL — ABNORMAL HIGH (ref 0.44–1.00)
GFR, EST NON AFRICAN AMERICAN: 57 mL/min — AB (ref >60)
GLUCOSE: 129 mg/dL — AB (ref 65–99)
Potassium: 4.5 mmol/L (ref 3.5–5.1)
SODIUM: 138 mmol/L (ref 135–145)

## 2017-02-19 NOTE — Pre-Procedure Instructions (Signed)
[facility]  02/19/2017    Your procedure is scheduled on Thursday, February 20, 2017 at 3:00 PM.   Report to [facility] Entrance "A" Admitting Office at 1:00 PM.   Call this number if you have problems the morning of surgery: [phone]   Remember:  Do not eat food or drink liquids after 7:00 AM Thursday, 02/20/17.  Take these medicines the morning of surgery with A SIP OF WATER: Amlodipine (Norvasc), Diazepam (Valium) - if needed, Oxycodone - if needed.  Stop NSAIDS (Ibuprofen, Aleve, etc) as of today   Do not wear jewelry, make-up or nail polish.  Do not wear lotions, powders, perfumes or deodorant.  Do not shave 48 hours prior to surgery.    Do not bring valuables to the hospital.  [facility] is not responsible for any belongings or valuables.  Contacts, dentures or bridgework may not be worn into surgery.  Leave your suitcase in the car.  After surgery it may be brought to your room.  For patients admitted to the hospital, discharge time will be determined by your treatment team.  [facility] - Preparing for Surgery  Before surgery, you can play an important role.  Because skin is not sterile, your skin needs to be as free of germs as possible.  You can reduce the number of germs on you skin by washing with CHG (chlorahexidine gluconate) soap before surgery.  CHG is an antiseptic cleaner which kills germs and bonds with the skin to continue killing germs even after washing.  Please DO NOT use if you have an allergy to CHG or antibacterial soaps.  If your skin becomes reddened/irritated stop using the CHG and inform your nurse when you arrive at Short Stay.  Do not shave (including legs and underarms) for at least 48 hours prior to the first CHG shower.  You may shave your face.  Please follow these instructions carefully:   1.  Shower with CHG Soap the night before surgery and the                    morning of Surgery.  2.  If you choose to wash  your hair, wash your hair first as usual with your       normal shampoo.  3.  After you shampoo, rinse your hair and body thoroughly to remove the shampoo.  4.  Use CHG as you would any other liquid soap.  You can apply chg directly       to the skin and wash gently with scrungie or a clean washcloth.  5.  Apply the CHG Soap to your body ONLY FROM THE NECK DOWN.        Do not use on open wounds or open sores.  Avoid contact with your eyes, ears, mouth and genitals (private parts).  Wash genitals (private parts) with your normal soap.  6.  Wash thoroughly, paying special attention to the area where your surgery        will be performed.  7.  Thoroughly rinse your body with warm water from the neck down.  8.  DO NOT shower/wash with your normal soap after using and rinsing off       the CHG Soap.  9.  Pat yourself dry with a clean towel.            10.  Wear clean pajamas.            11.  Place  clean sheets on your bed the night of your first shower and do not        sleep with pets.  Day of Surgery  Do not apply any lotions/deodorants the morning of surgery.  Please wear clean clothes to the hospital.   Please read over the fact sheets that you were given.

## 2017-02-19 NOTE — Progress Notes (Signed)
Pt has history of atherosclerosis and is followed by Dr. Elberta Fortisamnitz. Last OV was 09/04/16. Pt denies chest pain or sob.

## 2017-02-20 ENCOUNTER — Ambulatory Visit (HOSPITAL_COMMUNITY): Payer: Worker's Compensation

## 2017-02-20 ENCOUNTER — Ambulatory Visit (HOSPITAL_COMMUNITY): Payer: Worker's Compensation | Admitting: Anesthesiology

## 2017-02-20 ENCOUNTER — Encounter (HOSPITAL_COMMUNITY)
Admission: RE | Disposition: A | Discharge status: Home / Self Care | Payer: Self-pay | Source: Ambulatory Visit | Attending: Orthopedic Surgery

## 2017-02-20 ENCOUNTER — Encounter (HOSPITAL_COMMUNITY): Payer: Self-pay | Admitting: *Deleted

## 2017-02-20 ENCOUNTER — Ambulatory Visit (HOSPITAL_COMMUNITY)
Admission: RE | Admit: 2017-02-20 | Discharge: 2017-02-21 | Disposition: A | Discharge status: Home / Self Care | Payer: Worker's Compensation | Source: Ambulatory Visit | Attending: Orthopedic Surgery | Admitting: Orthopedic Surgery

## 2017-02-20 DIAGNOSIS — I2510 Atherosclerotic heart disease of native coronary artery without angina pectoris: Secondary | ICD-10-CM | POA: Diagnosis not present

## 2017-02-20 DIAGNOSIS — Y9289 Other specified places as the place of occurrence of the external cause: Secondary | ICD-10-CM | POA: Insufficient documentation

## 2017-02-20 DIAGNOSIS — S42209A Unspecified fracture of upper end of unspecified humerus, initial encounter for closed fracture: Secondary | ICD-10-CM | POA: Diagnosis present

## 2017-02-20 DIAGNOSIS — Z79899 Other long term (current) drug therapy: Secondary | ICD-10-CM | POA: Insufficient documentation

## 2017-02-20 DIAGNOSIS — Z79891 Long term (current) use of opiate analgesic: Secondary | ICD-10-CM | POA: Insufficient documentation

## 2017-02-20 DIAGNOSIS — S42202D Unspecified fracture of upper end of left humerus, subsequent encounter for fracture with routine healing: Secondary | ICD-10-CM | POA: Diagnosis present

## 2017-02-20 DIAGNOSIS — Z791 Long term (current) use of non-steroidal anti-inflammatories (NSAID): Secondary | ICD-10-CM | POA: Insufficient documentation

## 2017-02-20 DIAGNOSIS — I10 Essential (primary) hypertension: Secondary | ICD-10-CM | POA: Insufficient documentation

## 2017-02-20 DIAGNOSIS — Z87891 Personal history of nicotine dependence: Secondary | ICD-10-CM | POA: Diagnosis not present

## 2017-02-20 DIAGNOSIS — S42239A 3-part fracture of surgical neck of unspecified humerus, initial encounter for closed fracture: Secondary | ICD-10-CM | POA: Insufficient documentation

## 2017-02-20 DIAGNOSIS — Z419 Encounter for procedure for purposes other than remedying health state, unspecified: Secondary | ICD-10-CM

## 2017-02-20 DIAGNOSIS — X58XXXA Exposure to other specified factors, initial encounter: Secondary | ICD-10-CM | POA: Insufficient documentation

## 2017-02-20 HISTORY — PX: ORIF HUMERUS FRACTURE: SHX2126

## 2017-02-20 SURGERY — OPEN REDUCTION INTERNAL FIXATION (ORIF) PROXIMAL HUMERUS FRACTURE
Anesthesia: General | Site: Shoulder | Laterality: Left

## 2017-02-20 MED ORDER — MIDAZOLAM HCL 2 MG/2ML IJ SOLN
INTRAMUSCULAR | Status: AC
  Administered 2017-02-20: 2 mg via INTRAVENOUS
  Filled 2017-02-20: qty 2

## 2017-02-20 MED ORDER — LOSARTAN POTASSIUM 50 MG PO TABS: 100.0000 mg | ORAL_TABLET | Freq: Every evening | ORAL | Status: DC

## 2017-02-20 MED ORDER — ROCURONIUM BROMIDE 50 MG/5ML IV SOLN
INTRAVENOUS | Status: AC
  Filled 2017-02-20: qty 1

## 2017-02-20 MED ORDER — HYDROCODONE-ACETAMINOPHEN 5-325 MG PO TABS: 1.0000 | ORAL_TABLET | ORAL | Status: DC | PRN

## 2017-02-20 MED ORDER — PROPOFOL 10 MG/ML IV BOLUS
INTRAVENOUS | Status: AC
  Filled 2017-02-20: qty 20

## 2017-02-20 MED ORDER — CEFAZOLIN SODIUM-DEXTROSE 2-4 GM/100ML-% IV SOLN
INTRAVENOUS | Status: AC
  Filled 2017-02-20: qty 100

## 2017-02-20 MED ORDER — BUPIVACAINE-EPINEPHRINE (PF) 0.5% -1:200000 IJ SOLN
INTRAMUSCULAR | Status: DC | PRN
  Administered 2017-02-20: 25 mL via PERINEURAL

## 2017-02-20 MED ORDER — CHLORHEXIDINE GLUCONATE 4 % EX LIQD: 60.0000 mL | Freq: Once | CUTANEOUS | Status: DC

## 2017-02-20 MED ORDER — ROCURONIUM BROMIDE 100 MG/10ML IV SOLN
INTRAVENOUS | Status: DC | PRN
  Administered 2017-02-20: 40 mg via INTRAVENOUS

## 2017-02-20 MED ORDER — PROPOFOL 10 MG/ML IV BOLUS
INTRAVENOUS | Status: DC | PRN
  Administered 2017-02-20: 140 mg via INTRAVENOUS

## 2017-02-20 MED ORDER — MIDAZOLAM HCL 2 MG/2ML IJ SOLN
2.0000 mg | Freq: Once | INTRAMUSCULAR | Status: AC
  Administered 2017-02-20: 2 mg via INTRAVENOUS

## 2017-02-20 MED ORDER — ONDANSETRON HCL 4 MG/2ML IJ SOLN
INTRAMUSCULAR | Status: AC
  Filled 2017-02-20: qty 2

## 2017-02-20 MED ORDER — ACETAMINOPHEN 325 MG PO TABS: 650.0000 mg | ORAL_TABLET | ORAL | Status: DC | PRN

## 2017-02-20 MED ORDER — METOCLOPRAMIDE HCL 5 MG/ML IJ SOLN
5.0000 mg | Freq: Three times a day (TID) | INTRAMUSCULAR | Status: DC | PRN
Start: 2017-02-20 — End: 2017-02-21

## 2017-02-20 MED ORDER — ONDANSETRON HCL 4 MG/2ML IJ SOLN: 4.0000 mg | Freq: Four times a day (QID) | INTRAMUSCULAR | Status: DC | PRN

## 2017-02-20 MED ORDER — MAGNESIUM CITRATE PO SOLN: 1.0000 | Freq: Once | ORAL | Status: DC | PRN

## 2017-02-20 MED ORDER — MEPERIDINE HCL 25 MG/ML IJ SOLN
INTRAMUSCULAR | Status: AC
  Administered 2017-02-20: 12.5 mg via INTRAVENOUS
  Filled 2017-02-20: qty 1

## 2017-02-20 MED ORDER — POLYETHYLENE GLYCOL 3350 17 G PO PACK: 17.0000 g | PACK | Freq: Every day | ORAL | Status: DC | PRN

## 2017-02-20 MED ORDER — MIDAZOLAM HCL 2 MG/2ML IJ SOLN
INTRAMUSCULAR | Status: AC
  Filled 2017-02-20: qty 2

## 2017-02-20 MED ORDER — HYDROMORPHONE HCL 1 MG/ML IJ SOLN
1.0000 mg | INTRAMUSCULAR | Status: DC | PRN
  Administered 2017-02-21: 1 mg via INTRAVENOUS
  Filled 2017-02-20: qty 1

## 2017-02-20 MED ORDER — 0.9 % SODIUM CHLORIDE (POUR BTL) OPTIME
TOPICAL | Status: DC | PRN
  Administered 2017-02-20: 1000 mL

## 2017-02-20 MED ORDER — HYDROMORPHONE HCL 1 MG/ML IJ SOLN
0.2500 mg | INTRAMUSCULAR | Status: DC | PRN
Start: 2017-02-20 — End: 2017-02-20
  Administered 2017-02-20 (×2): 0.5 mg via INTRAVENOUS

## 2017-02-20 MED ORDER — BISACODYL 5 MG PO TBEC: 5.0000 mg | DELAYED_RELEASE_TABLET | Freq: Every day | ORAL | Status: DC | PRN

## 2017-02-20 MED ORDER — MEPERIDINE HCL 25 MG/ML IJ SOLN
6.2500 mg | INTRAMUSCULAR | Status: DC | PRN
  Administered 2017-02-20: 12.5 mg via INTRAVENOUS

## 2017-02-20 MED ORDER — OXYCODONE HCL 5 MG PO TABS
10.0000 mg | ORAL_TABLET | ORAL | Status: DC | PRN
  Administered 2017-02-20 – 2017-02-21 (×4): 10 mg via ORAL
  Filled 2017-02-20 (×3): qty 2

## 2017-02-20 MED ORDER — LACTATED RINGERS IV SOLN
INTRAVENOUS | Status: DC
  Administered 2017-02-21: via INTRAVENOUS

## 2017-02-20 MED ORDER — PROMETHAZINE HCL 25 MG/ML IJ SOLN
6.2500 mg | INTRAMUSCULAR | Status: DC | PRN
  Administered 2017-02-20: 6.25 mg via INTRAVENOUS

## 2017-02-20 MED ORDER — PROMETHAZINE HCL 25 MG/ML IJ SOLN
INTRAMUSCULAR | Status: AC
  Administered 2017-02-20: 6.25 mg via INTRAVENOUS
  Filled 2017-02-20: qty 1

## 2017-02-20 MED ORDER — PHENYLEPHRINE HCL 10 MG/ML IJ SOLN
INTRAVENOUS | Status: DC | PRN
  Administered 2017-02-20: 100 ug/min via INTRAVENOUS

## 2017-02-20 MED ORDER — FENTANYL CITRATE (PF) 100 MCG/2ML IJ SOLN
50.0000 ug | Freq: Once | INTRAMUSCULAR | Status: AC
Start: 2017-02-20 — End: 2017-02-20
  Administered 2017-02-20: 50 ug via INTRAVENOUS

## 2017-02-20 MED ORDER — PHENOL 1.4 % MT LIQD: 1.0000 | OROMUCOSAL | Status: DC | PRN

## 2017-02-20 MED ORDER — FENTANYL CITRATE (PF) 100 MCG/2ML IJ SOLN
INTRAMUSCULAR | Status: DC | PRN
  Administered 2017-02-20: 100 ug via INTRAVENOUS

## 2017-02-20 MED ORDER — CEFAZOLIN SODIUM-DEXTROSE 2-4 GM/100ML-% IV SOLN
2.0000 g | INTRAVENOUS | Status: AC
  Administered 2017-02-20: 2 g via INTRAVENOUS

## 2017-02-20 MED ORDER — DIAZEPAM 5 MG PO TABS
2.5000 mg | ORAL_TABLET | Freq: Four times a day (QID) | ORAL | Status: DC | PRN
  Administered 2017-02-21: 5 mg via ORAL
  Filled 2017-02-20: qty 1

## 2017-02-20 MED ORDER — PHENYLEPHRINE HCL 10 MG/ML IJ SOLN
INTRAMUSCULAR | Status: DC | PRN
  Administered 2017-02-20: 80 ug via INTRAVENOUS
  Administered 2017-02-20: 160 ug via INTRAVENOUS
  Administered 2017-02-20: 80 ug via INTRAVENOUS

## 2017-02-20 MED ORDER — MENTHOL 3 MG MT LOZG: 1.0000 | LOZENGE | OROMUCOSAL | Status: DC | PRN

## 2017-02-20 MED ORDER — DOCUSATE SODIUM 100 MG PO CAPS
100.0000 mg | ORAL_CAPSULE | Freq: Two times a day (BID) | ORAL | Status: DC
  Administered 2017-02-21: 100 mg via ORAL
  Filled 2017-02-20: qty 1

## 2017-02-20 MED ORDER — LACTATED RINGERS IV SOLN
INTRAVENOUS | Status: DC
  Administered 2017-02-20 (×2): via INTRAVENOUS

## 2017-02-20 MED ORDER — OXYCODONE HCL 5 MG PO TABS
ORAL_TABLET | ORAL | Status: AC
  Filled 2017-02-20: qty 2

## 2017-02-20 MED ORDER — FENTANYL CITRATE (PF) 250 MCG/5ML IJ SOLN
INTRAMUSCULAR | Status: AC
  Filled 2017-02-20: qty 5

## 2017-02-20 MED ORDER — ONDANSETRON HCL 4 MG PO TABS: 4.0000 mg | ORAL_TABLET | Freq: Four times a day (QID) | ORAL | Status: DC | PRN

## 2017-02-20 MED ORDER — GLYCOPYRROLATE 0.2 MG/ML IJ SOLN
INTRAMUSCULAR | Status: DC | PRN
  Administered 2017-02-20: 0.2 mg via INTRAVENOUS

## 2017-02-20 MED ORDER — METOCLOPRAMIDE HCL 5 MG PO TABS: 5.0000 mg | ORAL_TABLET | Freq: Three times a day (TID) | ORAL | Status: DC | PRN

## 2017-02-20 MED ORDER — ACETAMINOPHEN 650 MG RE SUPP: 650.0000 mg | RECTAL | Status: DC | PRN

## 2017-02-20 MED ORDER — MIDAZOLAM HCL 5 MG/5ML IJ SOLN
INTRAMUSCULAR | Status: DC | PRN
Start: 2017-02-20 — End: 2017-02-20
  Administered 2017-02-20: 2 mg via INTRAVENOUS

## 2017-02-20 MED ORDER — FENTANYL CITRATE (PF) 100 MCG/2ML IJ SOLN
INTRAMUSCULAR | Status: AC
  Administered 2017-02-20: 50 ug via INTRAVENOUS
  Filled 2017-02-20: qty 2

## 2017-02-20 MED ORDER — DIPHENHYDRAMINE HCL 12.5 MG/5ML PO ELIX: 12.5000 mg | ORAL_SOLUTION | ORAL | Status: DC | PRN

## 2017-02-20 MED ORDER — LACTATED RINGERS IV SOLN: INTRAVENOUS | Status: DC

## 2017-02-20 MED ORDER — PHENYLEPHRINE 40 MCG/ML (10ML) SYRINGE FOR IV PUSH (FOR BLOOD PRESSURE SUPPORT)
PREFILLED_SYRINGE | INTRAVENOUS | Status: AC
  Filled 2017-02-20: qty 10

## 2017-02-20 MED ORDER — SUGAMMADEX SODIUM 200 MG/2ML IV SOLN
INTRAVENOUS | Status: DC | PRN
  Administered 2017-02-20: 150 mg via INTRAVENOUS

## 2017-02-20 MED ORDER — HYDROMORPHONE HCL 1 MG/ML IJ SOLN
INTRAMUSCULAR | Status: AC
  Administered 2017-02-20: 0.5 mg via INTRAVENOUS
  Filled 2017-02-20: qty 1

## 2017-02-20 MED ORDER — WHITE PETROLATUM EX OINT
TOPICAL_OINTMENT | CUTANEOUS | Status: AC
  Filled 2017-02-20: qty 28.35

## 2017-02-20 MED ORDER — ONDANSETRON HCL 4 MG/2ML IJ SOLN
INTRAMUSCULAR | Status: DC | PRN
  Administered 2017-02-20: 4 mg via INTRAVENOUS

## 2017-02-20 MED ORDER — CEFAZOLIN SODIUM-DEXTROSE 2-4 GM/100ML-% IV SOLN
2.0000 g | Freq: Four times a day (QID) | INTRAVENOUS | Status: AC
  Administered 2017-02-21 (×3): 2 g via INTRAVENOUS
  Filled 2017-02-20 (×3): qty 100

## 2017-02-20 SURGICAL SUPPLY — 64 items
BIT DRILL 3.2 (BIT) ×2
BIT DRILL 3.2XCALB NS DISP (BIT) ×1 IMPLANT
BIT DRILL CALIBRATED 2.7 (BIT) ×2 IMPLANT
BIT DRILL CALIBRATED 2.7MM (BIT) ×1
BIT DRL 3.2XCALB NS DISP (BIT) ×1
COVER SURGICAL LIGHT HANDLE (MISCELLANEOUS) ×3 IMPLANT
DERMABOND ADVANCED (GAUZE/BANDAGES/DRESSINGS) ×2
DERMABOND ADVANCED .7 DNX12 (GAUZE/BANDAGES/DRESSINGS) ×1 IMPLANT
DRAPE C-ARM 42X72 X-RAY (DRAPES) ×3 IMPLANT
DRAPE INCISE IOBAN 66X45 STRL (DRAPES) ×3 IMPLANT
DRAPE ORTHO SPLIT 77X108 STRL (DRAPES) ×4
DRAPE SURG ORHT 6 SPLT 77X108 (DRAPES) ×2 IMPLANT
DRAPE U-SHAPE 47X51 STRL (DRAPES) ×3 IMPLANT
DRSG AQUACEL AG ADV 3.5X10 (GAUZE/BANDAGES/DRESSINGS) ×3 IMPLANT
DURAPREP 26ML APPLICATOR (WOUND CARE) ×3 IMPLANT
ELECT REM PT RETURN 9FT ADLT (ELECTROSURGICAL) ×3
ELECTRODE REM PT RTRN 9FT ADLT (ELECTROSURGICAL) ×1 IMPLANT
GLOVE BIO SURGEON STRL SZ7.5 (GLOVE) ×3 IMPLANT
GLOVE BIO SURGEON STRL SZ8 (GLOVE) ×3 IMPLANT
GLOVE EUDERMIC 7 POWDERFREE (GLOVE) ×6 IMPLANT
GLOVE SS BIOGEL STRL SZ 7.5 (GLOVE) ×2 IMPLANT
GLOVE SUPERSENSE BIOGEL SZ 7.5 (GLOVE) ×4
GOWN STRL REUS W/ TWL LRG LVL3 (GOWN DISPOSABLE) ×1 IMPLANT
GOWN STRL REUS W/ TWL XL LVL3 (GOWN DISPOSABLE) ×2 IMPLANT
GOWN STRL REUS W/TWL LRG LVL3 (GOWN DISPOSABLE) ×2
GOWN STRL REUS W/TWL XL LVL3 (GOWN DISPOSABLE) ×4
K-WIRE 2X5 SS THRDED S3 (WIRE) ×3
KIT BASIN OR (CUSTOM PROCEDURE TRAY) ×3 IMPLANT
KIT ROOM TURNOVER OR (KITS) ×6 IMPLANT
KWIRE 2X5 SS THRDED S3 (WIRE) ×1 IMPLANT
MANIFOLD NEPTUNE II (INSTRUMENTS) ×3 IMPLANT
NDL SUT .5 MAYO 1.404X.05X (NEEDLE) IMPLANT
NEEDLE 22X1 1/2 (OR ONLY) (NEEDLE) ×3 IMPLANT
NEEDLE MAYO TAPER (NEEDLE)
NS IRRIG 1000ML POUR BTL (IV SOLUTION) ×3 IMPLANT
PACK SHOULDER (CUSTOM PROCEDURE TRAY) ×3 IMPLANT
PAD ARMBOARD 7.5X6 YLW CONV (MISCELLANEOUS) ×6 IMPLANT
PAD ORTHO SHOULDER 7X19 LRG (SOFTGOODS) ×3 IMPLANT
PEG LOCKING 3.2MMX46 (Peg) ×3 IMPLANT
PEG LOCKING 3.2X34 (Screw) ×9 IMPLANT
PEG LOCKING 3.2X36 (Screw) ×6 IMPLANT
PEG LOCKING 3.2X38 (Screw) ×3 IMPLANT
PEG LOCKING 3.2X50 (Screw) ×3 IMPLANT
PLATE PROX HUM HI L 3H 80 (Plate) ×3 IMPLANT
PUTTY DBM STAGRAFT PLUS 5CC (Putty) ×3 IMPLANT
RESTRAINT HEAD UNIVERSAL NS (MISCELLANEOUS) ×3 IMPLANT
SCREW LOCK CORT STAR 3.5X22 (Screw) ×3 IMPLANT
SCREW T15 MD 3.5X22MM NS (Screw) ×6 IMPLANT
SLEEVE MEASURING 3.2 (BIT) ×3 IMPLANT
SLING ARM FOAM STRAP LRG (SOFTGOODS) ×3 IMPLANT
SPONGE LAP 18X18 X RAY DECT (DISPOSABLE) ×3 IMPLANT
SUCTION FRAZIER HANDLE 10FR (MISCELLANEOUS) ×2
SUCTION TUBE FRAZIER 10FR DISP (MISCELLANEOUS) ×1 IMPLANT
SUT FIBERWIRE #2 38 T-5 BLUE (SUTURE)
SUT MNCRL AB 3-0 PS2 18 (SUTURE) ×3 IMPLANT
SUT VIC AB 1 CT1 27 (SUTURE) ×2
SUT VIC AB 1 CT1 27XBRD ANTBC (SUTURE) ×1 IMPLANT
SUT VIC AB 2-0 CT1 27 (SUTURE) ×4
SUT VIC AB 2-0 CT1 TAPERPNT 27 (SUTURE) ×2 IMPLANT
SUTURE FIBERWR #2 38 T-5 BLUE (SUTURE) IMPLANT
SYR CONTROL 10ML LL (SYRINGE) ×3 IMPLANT
TOWEL OR 17X24 6PK STRL BLUE (TOWEL DISPOSABLE) ×3 IMPLANT
TOWEL OR 17X26 10 PK STRL BLUE (TOWEL DISPOSABLE) ×3 IMPLANT
WATER STERILE IRR 1000ML POUR (IV SOLUTION) ×3 IMPLANT

## 2017-02-20 NOTE — H&P (Signed)
Kristin Schmidt    Chief Complaint: Left displaced proximal humerus fracture  HPI: The patient is a 62 y.o. female with displaced left 3 part proximal humerus fx  Past Medical History:  Diagnosis Date  . Coronary artery disease 2007   renal artery stent and subclavian artery stent - [place]  . Hypertension   . Pneumonia    "walking pneumonia"  . Renal disorder   . Tachycardia    pt was treated with Carvedilol, taken off of it in May, 2018 due to feeling fatigued.    Past Surgical History:  Procedure Laterality Date  . CESAREAN SECTION    . FRACTURE SURGERY Left    leg  . stent placement right kidney       Family History  Problem Relation Age of Onset  . COPD Mother   . COPD Father   . Heart attack Maternal Grandfather   . Cancer Sister     Social History:  reports that she quit smoking about 15 months ago. Her smoking use included Cigarettes. She has never used smokeless tobacco. She reports that she drinks alcohol. She reports that she does not use drugs.   Medications Prior to Admission  Medication Sig Dispense Refill  . amLODipine (NORVASC) 5 MG tablet Take 1 tablet (5 mg total) by mouth daily. 30 tablet 6  . diazepam (VALIUM) 5 MG tablet TAKE 1/2 TO 1 TAB EVERY 8 HOURS AS NEEDED FOR SPASMS OR SLEEP  0  . docusate sodium (COLACE) 50 MG capsule Take 50 mg by mouth 2 (two) times daily.    . furosemide (LASIX) 20 MG tablet Take 1 tablet (20 mg total) by mouth daily. (Patient taking differently: Take 20 mg by mouth every other day. ) 30 tablet 6  . ibuprofen (ADVIL,MOTRIN) 200 MG tablet Take 600 mg by mouth every 8 (eight) hours as needed.    Marland Kitchen losartan (COZAAR) 100 MG tablet Take 100 mg by mouth every evening.     . Magnesium Oxide 250 MG TABS Take 250 mg by mouth daily.    . nicotine (NICODERM CQ - DOSED IN MG/24 HOURS) 21 mg/24hr patch Place 21 mg onto the skin daily as needed.    . ondansetron (ZOFRAN ODT) 4 MG disintegrating tablet Take 1 tablet (4 mg total) by  mouth every 8 (eight) hours as needed. (Patient taking differently: Take 4 mg by mouth every 8 (eight) hours as needed for nausea or vomiting. ) 10 tablet 1  . oxyCODONE-acetaminophen (PERCOCET/ROXICET) 5-325 MG tablet TAKES 1 TABLET IN THE MORNING AND 0.5 TABLET IN THE AFTERNOON AND IN THE EVENING  0  . TURMERIC PO Take 1 tablet by mouth daily.    Marland Kitchen zolpidem (AMBIEN CR) 12.5 MG CR tablet Take 12.5 mg by mouth at bedtime as needed for sleep.    Marland Kitchen HYDROcodone-acetaminophen (NORCO/VICODIN) 5-325 MG tablet Take 1-2 tablets by mouth every 6 (six) hours as needed for moderate pain. (Patient not taking: Reported on 02/17/2017) 20 tablet 0     Physical Exam: left shoulder with diffuse swelling and tenderness, N/V intact LUE  Vitals  Temp:  [97.5 F (36.4 C)] 97.5 F (36.4 C) (10/18 1308) Pulse Rate:  [78-94] 94 (10/18 1449) Resp:  [12-27] 17 (10/18 1449) BP: (122-131)/(33-57) 122/33 (10/18 1449) SpO2:  [100 %] 100 % (10/18 1449)  Assessment/Plan  Impression: Left displaced 3 part proximal humerus fracture   Plan of Action: Procedure(s): OPEN REDUCTION INTERNAL FIXATION (ORIF) LEFT PROXIMAL HUMERUS FRACTURE  Sulamita Lafountain M Gwyn Hieronymus  02/20/2017, 2:53 PM Contact # [phone]

## 2017-02-20 NOTE — Anesthesia Procedure Notes (Signed)
Procedure Name: Intubation Date/Time: 02/20/2017 3:42 PM Performed by: Manus Gunning, Jackson Coffield J Pre-anesthesia Checklist: Patient identified, Emergency Drugs available, Suction available, Patient being monitored and Timeout performed Patient Re-evaluated:Patient Re-evaluated prior to induction Oxygen Delivery Method: Circle system utilized Preoxygenation: Pre-oxygenation with 100% oxygen Induction Type: IV induction Ventilation: Mask ventilation without difficulty Laryngoscope Size: Mac and 3 Grade View: Grade I Tube type: Oral Tube size: 7.0 mm Number of attempts: 1 Airway Equipment and Method: Stylet Placement Confirmation: ETT inserted through vocal cords under direct vision,  positive ETCO2 and breath sounds checked- equal and bilateral Secured at: 21 cm Tube secured with: Tape Dental Injury: Teeth and Oropharynx as per pre-operative assessment

## 2017-02-20 NOTE — Anesthesia Procedure Notes (Signed)
Anesthesia Regional Block: Interscalene brachial plexus block   Pre-Anesthetic Checklist: ,, timeout performed, Correct Patient, Correct Site, Correct Laterality, Correct Procedure, Correct Position, site marked, Risks and benefits discussed,  Surgical consent,  Pre-op evaluation,  At surgeon's request and post-op pain management  Laterality: Left  Prep: chloraprep       Needles:  Injection technique: Single-shot  Needle Type: Echogenic Stimulator Needle     Needle Length: 9cm  Needle Gauge: 21     Additional Needles:   Procedures:, nerve stimulator,,, ultrasound used (permanent image in chart),,,,   Nerve Stimulator or Paresthesia:  Response: deltoid and biceps, 0.5 mA,   Additional Responses:   Narrative:  Start time: 02/20/2017 2:40 PM End time: 02/20/2017 2:47 PM Injection made incrementally with aspirations every 5 mL.  Performed by: Personally  Anesthesiologist: Marcene DuosFITZGERALD, Courtne Lighty

## 2017-02-20 NOTE — Transfer of Care (Signed)
Immediate Anesthesia Transfer of Care Note  Patient: Kristin Schmidt  Procedure(s) Performed: OPEN REDUCTION INTERNAL FIXATION (ORIF) LEFT PROXIMAL HUMERUS FRACTURE (Left Shoulder)  Patient Location: PACU  Anesthesia Type:GA combined with regional for post-op pain  Level of Consciousness: awake and alert   Airway & Oxygen Therapy: Patient Spontanous Breathing and Patient connected to nasal cannula oxygen  Post-op Assessment: Report given to RN and Post -op Vital signs reviewed and stable  Post vital signs: Reviewed and stable  Last Vitals:  Vitals:   02/20/17 1449 02/20/17 1717  BP: (!) 122/33   Pulse: 94   Resp: 17   Temp:  (P) 36.5 C  SpO2: 100%     Last Pain:  Vitals:   02/20/17 1717  TempSrc:   PainSc: (P) 0-No pain      Patients Stated Pain Goal: 5 (02/20/17 1308)  Complications: No apparent anesthesia complications

## 2017-02-20 NOTE — Discharge Instructions (Signed)
° °  Kevin M. Supple, M.D., F.A.A.O.S. °Orthopaedic Surgery °Specializing in Arthroscopic and Reconstructive °Surgery of the Shoulder and Knee °[phone] °[address] - [place], [place] [address] - Fax [phone] ° ° °POST-OP  SHOULDER  INSTRUCTIONS ° °1. Call the office at [phone] to schedule your first post-op appointment 10-14 days from the date of your surgery. ° °2. The bandage over your incision is waterproof. You may begin showering with this dressing on. You may leave this dressing on until first follow up appointment within 2 weeks. We prefer you leave this dressing in place until follow up however after 5-7 days if you are having itching or skin irritation and would like to remove it you may do so. Go slow and tug at the borders gently to break the bond the dressing has with the skin. At this point if there is no drainage it is okay to go without a bandage or you may cover it with a light guaze and tape. You can also expect significant bruising around your shoulder that will drift down your arm and into your chest wall. This is very normal and should resolve over several days. ° ° 3. Wear your sling/immobilizer at all times except to perform the exercises below or to occasionally let your arm dangle by your side to stretch your elbow. You also need to sleep in your sling immobilizer until instructed otherwise. ° °4. Range of motion to your elbow, wrist, and hand are encouraged 3-5 times daily. Exercise to your hand and fingers helps to reduce swelling you may experience. ° °5. Utilize ice to the shoulder 3-5 times minimum a day and additionally if you are experiencing pain. ° °6. Prescriptions for a pain medication and a muscle relaxant are provided for you. It is recommended that if you are experiencing pain that you pain medication alone is not controlling, add the muscle relaxant along with the pain medication which can give additional pain relief. The first 1-2 days is generally  the most severe of your pain and then should gradually decrease. As your pain lessens it is recommended that you decrease your use of the pain medications to an "as needed basis'" only and to always comply with the recommended dosages of the pain medications. ° °7. Pain medications can produce constipation along with their use. If you experience this, the use of an over the counter stool softener or laxative daily is recommended.  ° ° ° °8. For additional questions or concerns, please do not hesitate to call the office. If after hours there is an answering service to forward your concerns to the physician on call. ° °POST-OP EXERCISES ° °Ok to allow arm to dangle and move elbow wrist and hand ° ° °

## 2017-02-20 NOTE — Anesthesia Preprocedure Evaluation (Addendum)
Anesthesia Evaluation  Patient identified by MRN, date of birth, ID band Patient awake    Reviewed: Allergy & Precautions, NPO status , Patient's Chart, lab work & pertinent test results  Airway Mallampati: II  TM Distance: >3 FB Neck ROM: Full    Dental  (+) Dental Advisory Given   Pulmonary former smoker,    breath sounds clear to auscultation       Cardiovascular hypertension, Pt. on medications + Peripheral Vascular Disease   Rhythm:Regular Rate:Normal  Low risk myoview in 2017 w/ normal EF and no ischemia.   Neuro/Psych negative neurological ROS     GI/Hepatic negative GI ROS, Neg liver ROS,   Endo/Other  negative endocrine ROS  Renal/GU Renal disease     Musculoskeletal   Abdominal   Peds  Hematology negative hematology ROS (+)   Anesthesia Other Findings   Reproductive/Obstetrics                            Lab Results  Component Value Date   WBC 8.6 02/19/2017   HGB 12.1 02/19/2017   HCT 36.4 02/19/2017   MCV 100.3 (H) 02/19/2017   PLT PLATELETS APPEAR ADEQUATE 02/19/2017   Lab Results  Component Value Date   CREATININE 1.03 (H) 02/19/2017   BUN 26 (H) 02/19/2017   NA 138 02/19/2017   K 4.5 02/19/2017   CL 107 02/19/2017   CO2 22 02/19/2017    Anesthesia Physical Anesthesia Plan  ASA: II  Anesthesia Plan: General   Post-op Pain Management:  Regional for Post-op pain   Induction: Intravenous  PONV Risk Score and Plan: 3 and Ondansetron, Dexamethasone, Midazolam and Treatment may vary due to age or medical condition  Airway Management Planned: Oral ETT  Additional Equipment:   Intra-op Plan:   Post-operative Plan: Extubation in OR  Informed Consent: I have reviewed the patients History and Physical, chart, labs and discussed the procedure including the risks, benefits and alternatives for the proposed anesthesia with the patient or authorized representative  who has indicated his/her understanding and acceptance.   Dental advisory given  Plan Discussed with: CRNA  Anesthesia Plan Comments:         Anesthesia Quick Evaluation

## 2017-02-20 NOTE — Op Note (Signed)
02/20/2017  4:50 PM  PATIENT:   Kristin Schmidt  61 y.o. female  PRE-OPERATIVE DIAGNOSIS:  Left displaced 3 part proximal humerus fracture   POST-OPERATIVE DIAGNOSIS:  same  PROCEDURE:  ORIF  SURGEON:  Jahmil Macleod, Vania ReaKevin M. M.D.  ASSISTANTS: Shuford pac   ANESTHESIA:   GET + ISB  EBL: minimal  SPECIMEN:  none  Drains: none   PATIENT DISPOSITION:  PACU - hemodynamically stable.    PLAN OF CARE: Admit for overnight observation   History:  [name] sustained a ground-level fall and injured her left shoulder, sustaining a displaced 3 part proximal humerus fracture. Due to the degree of displacement we have discussed with her the various treatment options and potential risks versus benefits thereof. Possible surgical complications were reviewed including the potential for bleeding infection neurovascular injury malunion nonunion loss of fixation anesthetic complication and possible need for additional surgery. She understands and accepts and agrees with the plan for ORIF  Procedure in detail:  After undergoing routine preoperative evaluation, in the OR holding area she received prophylactic robotics and an interscalene block was established by the anesthesia department. Brought to the operative and placed supine on the operative table and underwent the smooth induction of a general endotracheal anesthesia. Laced into the beach chair position and appropriately padded and protected.The left shoulder girdle region was then sterilely prepped and draped in standard fashion. Timeout was called.An anterior deltopectoral approach to the left shoulder was made.Dissection carried deeply and electrocautery was used for hemostasis. The deltopectoral interval was then developed from proximal to distal with the cephalic vein taken laterally. We then divided the adhesions beneath the deltoid and retracted the conjoined tendon medially. The fracture site was then identified and a reduction  maneuver was completed using elevators to mobilize the humeral head segment gaining appropriate reduction in relation to the humeral shaft The posterior tuberosity fragment was also identified and mobilized. A proximal humeral plate was thenprovisionally placedalong the lateral cortex of the proximal humerus and held with a bone-holding clamp.We confirmed overall good alignment with fluoroscopic images. Once we confirmed that the alignment and reduction was acceptable we then provisionally fixed the plate with a singlecortical screw through the shaft in a series of locking pegs up into the humeral head. We completed the fixation proximally and distally across the fracture site. This point then we selected a vial of demineralized bone matrix bone graft material and this was impacted into the fracture sitebut*satisfaction. Theposterior fragment off the greater tuberosity was then transfixed with a series of 5 wire sutures passed through the bone tendon junction of the fragment and then looped through the plate which allowed us to nicely reapproximate the tuberosity fragment to the proper interval on the posterior aspect of the tuberosity. Final fluoroscopic images showed good overall alignment and good position of the hardware. The wound was then irrigated. Hemostasis was obtained. The deltopectoral interval was then reapproximated with a series of figure-of-eight #1 Vicryl sutures. 2-0 Vicryl used for the subcutaneous layer and intracuticular 3-0 Monocryl for the skin followed by Dermabond DRESSING. LEFT ARM THEN PLACED INTO A SLING. THE PATIENT WAS THEN AWAKENED EXTUBATED AND TAKEN TO THE RECOVERY ROOM IN STABLE CONDITION.  Dictation# SEE ABOVE   Contact # [phone]

## 2017-02-21 DIAGNOSIS — S42239A 3-part fracture of surgical neck of unspecified humerus, initial encounter for closed fracture: Secondary | ICD-10-CM | POA: Diagnosis not present

## 2017-02-21 MED ORDER — KETOROLAC TROMETHAMINE 15 MG/ML IJ SOLN
15.0000 mg | Freq: Once | INTRAMUSCULAR | Status: AC
  Administered 2017-02-21: 15 mg via INTRAVENOUS
  Filled 2017-02-21: qty 1

## 2017-02-21 MED ORDER — DIAZEPAM 5 MG PO TABS: 2.5000 mg | ORAL_TABLET | Freq: Four times a day (QID) | ORAL | 0 refills | Status: DC | PRN

## 2017-02-21 MED ORDER — HYDROMORPHONE HCL 2 MG PO TABS: 2.0000 mg | ORAL_TABLET | ORAL | 0 refills | Status: DC | PRN

## 2017-02-21 MED ORDER — OXYCODONE HCL 5 MG PO TABS: 5.0000 mg | ORAL_TABLET | ORAL | 0 refills | Status: DC | PRN

## 2017-02-21 NOTE — Progress Notes (Signed)
OT Cancellation Note  Patient Details Name: Kristin Schmidt MRN: [id number] DOB: 12/15/1955   Cancelled Treatment:    Reason Eval/Treat Not Completed: Other (comment). Pt lethargic (meds related) and requesting OT reattempt "I just got in bed and got some peace and quiet." Plan to reattempt this morning.   Raynald KempKathryn Aedyn Kempfer OTR/L Pager: [phone]  02/21/2017, 9:10 AM

## 2017-02-21 NOTE — Anesthesia Postprocedure Evaluation (Signed)
Anesthesia Post Note  Patient: Kristin Schmidt  Procedure(s) Performed: OPEN REDUCTION INTERNAL FIXATION (ORIF) LEFT PROXIMAL HUMERUS FRACTURE (Left Shoulder)     Anesthesia Post Evaluation  Last Vitals:  Vitals:   02/21/17 0144 02/21/17 0606  BP: 111/60 110/62  Pulse: 97 96  Resp: 18 16  Temp: 37 C 37.1 C  SpO2: 95% 96%    Last Pain:  Vitals:   02/21/17 1143  TempSrc:   PainSc: 4    Pain Goal: Patients Stated Pain Goal: 4 (02/21/17 1143)               Kennieth RadFitzgerald, Shrihaan Porzio E

## 2017-02-21 NOTE — Progress Notes (Signed)
Patient admitted from PACU s/p left shoulder IM nailing .Alert and oriented x4. Vital signs stable. Dressing to left shoulder clean,dry and intact. Denies pain at this time. Oriented to room and call bell.

## 2017-02-21 NOTE — Discharge Summary (Signed)
PATIENT ID:      Kristin Schmidt  MRN:     [id number] DOB/AGE:    Jul 26, 1955 / 61 y.o.     DISCHARGE SUMMARY  ADMISSION DATE:    02/20/2017 DISCHARGE DATE:    ADMISSION DIAGNOSIS: Left displaced proximal humerus fracture  Past Medical History:  Diagnosis Date  . Coronary artery disease 2007   renal artery stent and subclavian artery stent - [place]  . Hypertension   . Pneumonia    "walking pneumonia"  . Renal disorder   . Tachycardia    pt was treated with Carvedilol, taken off of it in May, 2018 due to feeling fatigued.    DISCHARGE DIAGNOSIS:   Active Problems:   Proximal humerus fracture   PROCEDURE: Procedure(s): OPEN REDUCTION INTERNAL FIXATION (ORIF) LEFT PROXIMAL HUMERUS FRACTURE on 02/20/2017  CONSULTS:    HISTORY:  See H&P in chart.  HOSPITAL COURSE:  Kristin Schmidt is a 61 y.o. admitted on 02/20/2017 with a diagnosis of Left displaced proximal humerus fracture .  They were brought to the operating room on 02/20/2017 and underwent Procedure(s): OPEN REDUCTION INTERNAL FIXATION (ORIF) LEFT PROXIMAL HUMERUS FRACTURE.    They were given perioperative antibiotics: Anti-infectives    Start     Dose/Rate Route Frequency Ordered Stop   02/21/17 0600  ceFAZolin (ANCEF) IVPB 2g/100 mL premix     2 g 200 mL/hr over 30 Minutes Intravenous On call to O.R. 02/20/17 1247 02/20/17 1532   02/21/17 0000  ceFAZolin (ANCEF) IVPB 2g/100 mL premix     2 g 200 mL/hr over 30 Minutes Intravenous Every 6 hours 02/20/17 2257 02/21/17 1759   02/20/17 1252  ceFAZolin (ANCEF) 2-4 GM/100ML-% IVPB    Comments:  Schonewitz, Leigh   : cabinet override      02/20/17 1252 02/20/17 1532    .  Patient underwent the above named procedure and tolerated it well. The following day they were hemodynamically stable and pain was initially poorly controlled  controlled on oral analgesics. Meds were adjusted and she became more comfortable They were neurovascularly intact to the operative  extremity. OT was ordered and worked with patient per protocol. They were medically and orthopaedically stable for discharge on day 1 .    DIAGNOSTIC STUDIES:  RECENT RADIOGRAPHIC STUDIES :  Dg Shoulder Left  Result Date: 02/11/2017 CLINICAL DATA:  Fall today with shoulder pain.  Initial encounter. EXAM: LEFT SHOULDER - 2+ VIEW COMPARISON:  None. FINDINGS: Transverse fracture across the surgical neck of the humerus with up to 3 mm of anterior displacement and 7 mm of posterior impaction. There is fragmentation of the greater tuberosity with of to 3 mm of offset when measured superiorly. No dislocation. Normal alignment at the acromioclavicular joint. Great vessels stent, likely subclavian artery. IMPRESSION: Humeral neck and greater tuberosity fracture with displacement described above. Electronically Signed   By: Marnee Spring M.D.   On: 02/11/2017 14:18   Dg Humerus Left  Result Date: 02/20/2017 CLINICAL DATA:  ORIF left humerus EXAM: DG C-ARM 61-120 MIN; LEFT HUMERUS - 2+ VIEW COMPARISON:  02/11/2017 FINDINGS: Total fluoroscopy time was 49 seconds. Three low resolution spot intraoperative views of the left humerus. Images demonstrate surgical plate and multiple screw fixation of a proximal humerus fracture with anatomic alignment. IMPRESSION: Intraoperative fluoroscopic assistance provided during surgical fixation of proximal humerus fracture Electronically Signed   By: Jasmine Pang M.D.   On: 02/20/2017 16:56   Dg C-arm 1-60 Min  Result Date: 02/20/2017 CLINICAL DATA:  ORIF left humerus EXAM: DG C-ARM 61-120 MIN; LEFT HUMERUS - 2+ VIEW COMPARISON:  02/11/2017 FINDINGS: Total fluoroscopy time was 49 seconds. Three low resolution spot intraoperative views of the left humerus. Images demonstrate surgical plate and multiple screw fixation of a proximal humerus fracture with anatomic alignment. IMPRESSION: Intraoperative fluoroscopic assistance provided during surgical fixation of proximal  humerus fracture Electronically Signed   By: Jasmine PangKim  Fujinaga M.D.   On: 02/20/2017 16:56    RECENT VITAL SIGNS:  Patient Vitals for the past 24 hrs:  BP Temp Temp src Pulse Resp SpO2 Height Weight  02/21/17 0606 110/62 98.7 F (37.1 C) Oral 96 16 96 % - -  02/21/17 0144 111/60 98.6 F (37 C) Oral 97 18 95 % - -  02/20/17 2245 110/61 98.3 F (36.8 C) Oral 97 16 95 % 5\' 2"  (1.575 m) 74.5 kg (164 lb 3.2 oz)  02/20/17 2035 (!) 109/50 - - (!) 105 16 95 % - -  02/20/17 2024 107/63 - - (!) 121 14 97 % - -  02/20/17 1951 113/88 - - - 16 - - -  02/20/17 1935 (!) 130/59 - - (!) 110 14 93 % - -  02/20/17 1920 125/68 - - 92 12 96 % - -  02/20/17 1905 139/72 - - (!) 101 12 95 % - -  02/20/17 1850 (!) 153/82 - - (!) 115 12 93 % - -  02/20/17 1835 (!) 148/78 - - (!) 116 14 94 % - -  02/20/17 1820 (!) 147/98 - - - - - - -  02/20/17 1746 (!) 146/83 - - (!) 123 18 97 % - -  02/20/17 1730 (!) 151/83 - - (!) 124 12 97 % - -  02/20/17 1717 - 97.7 F (36.5 C) - - - - - -  02/20/17 1714 (!) 156/75 - - (!) 138 - 97 % - -  02/20/17 1449 (!) 122/33 - - 94 17 100 % - -  02/20/17 1445 - - - 92 (!) 27 100 % - -  02/20/17 1440 - - - 84 12 100 % - -  02/20/17 1435 - - - 86 13 100 % - -  02/20/17 1430 - - - 86 15 100 % - -  02/20/17 1425 - - - 81 17 100 % - -  02/20/17 1420 - - - - 15 - - -  02/20/17 1308 (!) 131/57 (!) 97.5 F (36.4 C) Oral 78 20 100 % - -  .  RECENT EKG RESULTS:    Orders placed or performed during the hospital encounter of 02/19/17  . EKG 12 lead  . EKG 12 lead    DISCHARGE INSTRUCTIONS:    DISCHARGE MEDICATIONS:   Allergies as of 02/21/2017   No Known Allergies     Medication List    STOP taking these medications   HYDROcodone-acetaminophen 5-325 MG tablet Commonly known as:  NORCO/VICODIN   oxyCODONE-acetaminophen 5-325 MG tablet Commonly known as:  PERCOCET/ROXICET     TAKE these medications   amLODipine 5 MG tablet Commonly known as:  NORVASC Take 1 tablet (5 mg  total) by mouth daily.   diazepam 5 MG tablet Commonly known as:  VALIUM Take 0.5-1 tablets (2.5-5 mg total) by mouth every 6 (six) hours as needed for muscle spasms or sedation. What changed:  See the new instructions.   docusate sodium 50 MG capsule Commonly known as:  COLACE Take 50 mg by mouth 2 (two) times daily.  furosemide 20 MG tablet Commonly known as:  LASIX Take 1 tablet (20 mg total) by mouth daily. What changed:  when to take this   HYDROmorphone 2 MG tablet Commonly known as:  DILAUDID Take 1 tablet (2 mg total) by mouth every 4 (four) hours as needed for severe pain. For pain not controlled by oxycodone   ibuprofen 200 MG tablet Commonly known as:  ADVIL,MOTRIN Take 600 mg by mouth every 8 (eight) hours as needed.   losartan 100 MG tablet Commonly known as:  COZAAR Take 100 mg by mouth every evening.   Magnesium Oxide 250 MG Tabs Take 250 mg by mouth daily.   nicotine 21 mg/24hr patch Commonly known as:  NICODERM CQ - dosed in mg/24 hours Place 21 mg onto the skin daily as needed.   ondansetron 4 MG disintegrating tablet Commonly known as:  ZOFRAN ODT Take 1 tablet (4 mg total) by mouth every 8 (eight) hours as needed. What changed:  reasons to take this   oxyCODONE 5 MG immediate release tablet Commonly known as:  Oxy IR/ROXICODONE Take 1 tablet (5 mg total) by mouth every 4 (four) hours as needed for severe pain ((score 7 to 10)).   TURMERIC PO Take 1 tablet by mouth daily.   zolpidem 12.5 MG CR tablet Commonly known as:  AMBIEN CR Take 12.5 mg by mouth at bedtime as needed for sleep.       FOLLOW UP VISIT:   Follow-up Information    Francena Hanly, MD.   Specialty:  Orthopedic Surgery Why:  call to be seen in 10-14 days Contact information: [address] [place] [address] [phone]           DISCHARGE TO: Home   DISCHARGE CONDITION:  Rodolph Bong for Dr. Francena Hanly 02/21/2017, 8:21  AM

## 2017-02-21 NOTE — Progress Notes (Signed)
Patient discharged home in stable condition. Verbalizes understanding of all discharge instructions, including home medications and follow up appointments. 

## 2017-02-24 ENCOUNTER — Encounter (HOSPITAL_COMMUNITY): Payer: Self-pay | Admitting: Orthopedic Surgery

## 2017-03-05 ENCOUNTER — Ambulatory Visit: Payer: PRIVATE HEALTH INSURANCE | Admitting: Cardiology

## 2017-03-25 ENCOUNTER — Ambulatory Visit: Payer: Self-pay | Admitting: Cardiology

## 2017-03-27 ENCOUNTER — Other Ambulatory Visit: Payer: Self-pay | Admitting: Cardiology

## 2017-03-31 ENCOUNTER — Encounter: Payer: Self-pay | Admitting: Cardiology

## 2017-03-31 ENCOUNTER — Ambulatory Visit: Payer: PRIVATE HEALTH INSURANCE | Admitting: Cardiology

## 2017-03-31 VITALS — BP 160/90 | HR 108 | Ht 62.0 in | Wt 156.2 lb

## 2017-03-31 DIAGNOSIS — I10 Essential (primary) hypertension: Secondary | ICD-10-CM

## 2017-03-31 DIAGNOSIS — E785 Hyperlipidemia, unspecified: Secondary | ICD-10-CM

## 2017-03-31 MED ORDER — DILTIAZEM HCL ER COATED BEADS 180 MG PO CP24: 180.0000 mg | ORAL_CAPSULE | Freq: Every day | ORAL | 2 refills | Status: DC

## 2017-03-31 MED ORDER — AMLODIPINE BESYLATE 10 MG PO TABS: 10.0000 mg | ORAL_TABLET | Freq: Every day | ORAL | 2 refills | Status: DC

## 2017-03-31 NOTE — Progress Notes (Signed)
Electrophysiology Office Note   Date:  03/31/2017   ID:  Kristin Schmidt, DOB 03/24/1956, MRN [id number]  PCP:  Eather ColasHunter, Megan A, FNP  Cardiologist:   Regan LemmingWill Martin Camnitz, MD    Chief Complaint  Patient presents with  . Follow-up    Essential Hypertension     History of Present Illness: Kristin Schmidt is a 61 y.o. female who presents today for electrophysiology evaluation.   PMH of hypertension, atherosclerosis (underwent cardiac cath in [place] in 2007 and had renal artery stent placed, as well as a stent to her left subclavian), and 30 pack year smoking history. Presented to the ER with SOB and chest tightness. Myoview was low risk with a normal LVEF.She has had no further episodes of chest pain since her Myoview was performed.   Today, denies symptoms of palpitations, chest pain, shortness of breath, orthopnea, PND, lower extremity edema, claudication, dizziness, presyncope, syncope, bleeding, or neurologic sequela. The patient is tolerating medications without difficulties.  In October 2018, she had a left proximal humerus fracture and underwent ORIF.  At that time she is done fairly well.  She does say that her blood pressure and heart rate did went up after her operation.  Prior to her fracture, her blood pressure was in the 120s systolic.   Past Medical History:  Diagnosis Date  . Coronary artery disease 2007   renal artery stent and subclavian artery stent - [place]  . Hypertension   . Pneumonia    "walking pneumonia"  . Renal disorder   . Tachycardia    pt was treated with Carvedilol, taken off of it in May, 2018 due to feeling fatigued.   Past Surgical History:  Procedure Laterality Date  . CESAREAN SECTION    . FRACTURE SURGERY Left    leg  . ORIF HUMERUS FRACTURE Left 02/20/2017   Procedure: OPEN REDUCTION INTERNAL FIXATION (ORIF) LEFT PROXIMAL HUMERUS FRACTURE;  Surgeon: Francena HanlySupple, Kevin, MD;  Location: [facility] OR;  Service: Orthopedics;  Laterality: Left;  .  stent placement right kidney        Current Outpatient Medications  Medication Sig Dispense Refill  . amLODipine (NORVASC) 5 MG tablet TAKE 1 TABLET BY MOUTH EVERY DAY 30 tablet 5  . docusate sodium (COLACE) 50 MG capsule Take 50 mg by mouth 2 (two) times daily.    . hydrochlorothiazide (HYDRODIURIL) 25 MG tablet Take 25 mg by mouth daily.  3  . ibuprofen (ADVIL,MOTRIN) 200 MG tablet Take 600 mg by mouth every 8 (eight) hours as needed.    Marland Kitchen. losartan (COZAAR) 100 MG tablet Take 100 mg by mouth every evening.     . Magnesium Oxide 250 MG TABS Take 250 mg by mouth daily.    . methocarbamol (ROBAXIN) 500 MG tablet Take 500 mg by mouth every 8 (eight) hours as needed for muscle spasms.  0  . nicotine (NICODERM CQ - DOSED IN MG/24 HOURS) 21 mg/24hr patch Place 21 mg onto the skin daily as needed.    . TURMERIC PO Take 1 tablet by mouth daily.    Marland Kitchen. zolpidem (AMBIEN CR) 12.5 MG CR tablet Take 12.5 mg by mouth at bedtime as needed for sleep.     No current facility-administered medications for this visit.     Allergies:   Hydrocodone-acetaminophen   Social History:  The patient  reports that she quit smoking about 16 months ago. Her smoking use included cigarettes. she has never used smokeless tobacco. She reports that she drinks alcohol.  She reports that she does not use drugs.   Family History:  The patient's family history includes COPD in her father and mother; Cancer in her sister; Heart attack in her maternal grandfather.    ROS:  Please see the history of present illness.   Otherwise, review of systems is positive for left arm pain.   All other systems are reviewed and negative.   PHYSICAL EXAM: VS:  BP (!) 160/90   Pulse (!) 108   Ht 5\' 2"  (1.575 m)   Wt 156 lb 3.2 oz (70.9 kg)   BMI 28.57 kg/m  , BMI Body mass index is 28.57 kg/m. GEN: Well nourished, well developed, in no acute distress  HEENT: normal  Neck: no JVD, carotid bruits, or masses Cardiac: Tachycardic, regular; no  murmurs, rubs, or gallops,no edema  Respiratory:  clear to auscultation bilaterally, normal work of breathing GI: soft, nontender, nondistended, + BS MS: no deformity or atrophy  Skin: warm and dry Neuro:  Strength and sensation are intact Psych: euthymic mood, full affect  EKG:  EKG is not ordered today. Personal review of the ekg ordered 02/19/17 shows sinus rhythm, rate 109   Recent Labs: 02/19/2017: BUN 26; Creatinine, Ser 1.03; Hemoglobin 12.1; Platelets PLATELETS APPEAR ADEQUATE; Potassium 4.5; Sodium 138    Lipid Panel  No results found for: CHOL, TRIG, HDL, CHOLHDL, VLDL, LDLCALC, LDLDIRECT   Wt Readings from Last 3 Encounters:  03/31/17 156 lb 3.2 oz (70.9 kg)  02/20/17 164 lb 3.2 oz (74.5 kg)  02/19/17 157 lb (71.2 kg)      Other studies Reviewed: Additional studies/ records that were reviewed today include: SPECT 02/28/16  The left ventricular ejection fraction is hyperdynamic (>65%).  Nuclear stress EF: 77%.  There was no ST segment deviation noted during stress.  The study is normal.  This is a low risk study.    ASSESSMENT AND PLAN:  1.  Hypertension: Pressure is elevated today.  There may be an pain components to this.  That being said, I do feel like she needs higher doses of her blood pressure medications just in the short-term.  Will increase amlodipine to 10 mg and start her on diltiazem 180 mg.  2. Hyperlipidemia: Continues to decline statin therapy.  No changes.  Current medicines are reviewed at length with the patient today.   The patient does not have concerns regarding her medicines.  The following changes were made today:   Increase amlodipine, start diltiazem  Labs/ tests ordered today include:  No orders of the defined types were placed in this encounter.    Disposition:   FU with Will Camnitz 6 months  Signed, Will Jorja LoaMartin Camnitz, MD  03/31/2017 2:22 PM     [facility] [address] [place]  [id number] [phone] (office) [phone] (fax)

## 2017-03-31 NOTE — Patient Instructions (Addendum)
Medication Instructions:  Your physician has recommended you make the following change in your medication:  1. INCREASE AMLODIPINE (Norvasc) to 10 mg once daily 2. START DILTIAZEM (Cardizem) 180 mg once daily  * If you need a refill on your cardiac medications before your next appointment, please call your pharmacy. *  Labwork: None ordered  Testing/Procedures: None ordered  Follow-Up: Your physician wants you to follow-up in: 6 months with Dr. Elberta Fortisamnitz.  You will receive a reminder letter in the mail two months in advance. If you don't receive a letter, please call our office to schedule the follow-up appointment.  Thank you for choosing [facility]!!   Dory HornSherri Deah Ottaway, RN [phone]  Any Other Special Instructions Will Be Listed Below (If Applicable).  Diltiazem extended-release capsules or tablets What is this medicine? DILTIAZEM (dil TYE a zem) is a calcium-channel blocker. It affects the amount of calcium found in your heart and muscle cells. This relaxes your blood vessels, which can reduce the amount of work the heart has to do. This medicine is used to treat high blood pressure and chest pain caused by angina. This medicine may be used for other purposes; ask your health care provider or pharmacist if you have questions. COMMON BRAND NAME(S): Cardizem CD, Cardizem LA, Cardizem SR, Cartia XT, Dilacor XR, Dilt-CD, Diltia XT, Diltzac, Matzim LA, Verna Czechaztia XT, Tiamate, Tiazac What should I tell my health care provider before I take this medicine? They need to know if you have any of these conditions: -heart problems, low blood pressure, irregular heartbeat -liver disease -previous heart attack -an unusual or allergic reaction to diltiazem, other medicines, foods, dyes, or preservatives -pregnant or trying to get pregnant -breast-feeding How should I use this medicine? Take this medicine by mouth with a glass of water. Follow the directions on the prescription label. Swallow  whole, do not crush or chew. Ask your doctor or pharmacist if your should take this medicine with food. Take your doses at regular intervals. Do not take your medicine more often then directed. Do not stop taking except on the advice of your doctor or health care professional. Ask your doctor or health care professional how to gradually reduce the dose. Talk to your pediatrician regarding the use of this medicine in children. Special care may be needed. Overdosage: If you think you have taken too much of this medicine contact a poison control center or emergency room at once. NOTE: This medicine is only for you. Do not share this medicine with others. What if I miss a dose? If you miss a dose, take it as soon as you can. If it is almost time for your next dose, take only that dose. Do not take double or extra doses. What may interact with this medicine? Do not take this medicine with any of the following medications: -cisapride -hawthorn -pimozide -ranolazine -red yeast rice This medicine may also interact with the following medications: -buspirone -carbamazepine -cimetidine -cyclosporine -digoxin -local anesthetics or general anesthetics -lovastatin -medicines for anxiety or difficulty sleeping like midazolam and triazolam -medicines for high blood pressure or heart problems -quinidine -rifampin, rifabutin, or rifapentine This list may not describe all possible interactions. Give your health care provider a list of all the medicines, herbs, non-prescription drugs, or dietary supplements you use. Also tell them if you smoke, drink alcohol, or use illegal drugs. Some items may interact with your medicine. What should I watch for while using this medicine? Check your blood pressure and pulse rate regularly. Ask  your doctor or health care professional what your blood pressure and pulse rate should be and when you should contact him or her. You may feel dizzy or lightheaded. Do not drive, use  machinery, or do anything that needs mental alertness until you know how this medicine affects you. To reduce the risk of dizzy or fainting spells, do not sit or stand up quickly, especially if you are an older patient. Alcohol can make you more dizzy or increase flushing and rapid heartbeats. Avoid alcoholic drinks. What side effects may I notice from receiving this medicine? Side effects that you should report to your doctor or health care professional as soon as possible: -allergic reactions like skin rash, itching or hives, swelling of the face, lips, or tongue -confusion, mental depression -feeling faint or lightheaded, falls -redness, blistering, peeling or loosening of the skin, including inside the mouth -slow, irregular heartbeat -swelling of the feet and ankles -unusual bleeding or bruising, pinpoint red spots on the skin Side effects that usually do not require medical attention (report to your doctor or health care professional if they continue or are bothersome): -constipation or diarrhea -difficulty sleeping -facial flushing -headache -nausea, vomiting -sexual dysfunction -weak or tired This list may not describe all possible side effects. Call your doctor for medical advice about side effects. You may report side effects to FDA at 1-800-FDA-1088. Where should I keep my medicine? Keep out of the reach of children. Store at room temperature between 15 and 30 degrees C (59 and 86 degrees F). Protect from humidity. Throw away any unused medicine after the expiration date. NOTE: This sheet is a summary. It may not cover all possible information. If you have questions about this medicine, talk to your doctor, pharmacist, or health care provider.  2018 Elsevier/Gold Standard (2007-08-13 14:35:47)

## 2017-05-27 ENCOUNTER — Other Ambulatory Visit: Payer: Self-pay | Admitting: Cardiology

## 2017-10-02 ENCOUNTER — Encounter: Payer: Self-pay | Admitting: Cardiology

## 2017-10-02 ENCOUNTER — Ambulatory Visit: Payer: PRIVATE HEALTH INSURANCE | Admitting: Cardiology

## 2017-10-02 VITALS — BP 136/76 | HR 105 | Ht 62.0 in | Wt 148.0 lb

## 2017-10-02 DIAGNOSIS — E785 Hyperlipidemia, unspecified: Secondary | ICD-10-CM

## 2017-10-02 DIAGNOSIS — I10 Essential (primary) hypertension: Secondary | ICD-10-CM

## 2017-10-02 NOTE — Patient Instructions (Addendum)
Medication Instructions:  Your physician recommends that you continue on your current medications as directed. Please refer to the Current Medication list given to you today.  Labwork: None ordered     *We will only notify you of abnormal results, otherwise continue current treatment plan.  Testing/Procedures: None ordered  Follow-Up: Your physician wants you to follow-up in: 6 months with Dr. Camnitz.  You will receive a reminder letter in the mail two months in advance. If you don't receive a letter, please call our office to schedule the follow-up appointment.   * If you need a refill on your cardiac medications before your next appointment, please call your pharmacy.   *Please note that any paperwork needing to be filled out by the provider will need to be addressed at the front desk prior to seeing the provider. Please note that any FMLA, disability or other documents regarding health condition is subject to a $25.00 charge that must be received prior to completion of paperwork in the form of a money order or check.  Thank you for choosing [facility]!!   Malin Cervini, RN [phone]  Any Other Special Instructions Will Be Listed Below (If Applicable).        

## 2017-10-02 NOTE — Progress Notes (Signed)
Electrophysiology Office Note   Date:  10/02/2017   ID:  Delisa Finck, DOB 1955-11-20, MRN [id number]  PCP:  Eather Colas, FNP  Cardiologist:   Regan Lemming, MD    Chief Complaint  Patient presents with  . Follow-up    Essential hypertension     History of Present Illness: Kristin Schmidt is a 62 y.o. female who presents today for electrophysiology evaluation.   PMH of hypertension, atherosclerosis (underwent cardiac cath in [place] in 2007 and had renal artery stent placed, as well as a stent to her left subclavian), and 30 pack year smoking history. Presented to the ER with SOB and chest tightness. Myoview was low risk with a normal LVEF.She has had no further episodes of chest pain since her Myoview was performed.   Today, denies symptoms of palpitations, chest pain, shortness of breath, orthopnea, PND, lower extremity edema, claudication, dizziness, presyncope, syncope, bleeding, or neurologic sequela. The patient is tolerating medications without difficulties.  Overall she is doing well.  She has no major complaints.  She does not have chest pain or shortness of breath.  She is trying to find a new exercise regimen.   Past Medical History:  Diagnosis Date  . Coronary artery disease 2007   renal artery stent and subclavian artery stent - [place]  . Hypertension   . Pneumonia    "walking pneumonia"  . Renal disorder   . Tachycardia    pt was treated with Carvedilol, taken off of it in May, 2018 due to feeling fatigued.   Past Surgical History:  Procedure Laterality Date  . CESAREAN SECTION    . FRACTURE SURGERY Left    leg  . ORIF HUMERUS FRACTURE Left 02/20/2017   Procedure: OPEN REDUCTION INTERNAL FIXATION (ORIF) LEFT PROXIMAL HUMERUS FRACTURE;  Surgeon: Francena Hanly, MD;  Location: [facility] OR;  Service: Orthopedics;  Laterality: Left;  . stent placement right kidney        Current Outpatient Medications  Medication Sig Dispense Refill  .  amLODipine (NORVASC) 10 MG tablet Take 1 tablet (10 mg total) by mouth daily. 90 tablet 2  . diltiazem (CARDIZEM CD) 180 MG 24 hr capsule Take 1 capsule (180 mg total) by mouth daily. 90 capsule 2  . hydrochlorothiazide (HYDRODIURIL) 25 MG tablet TAKE 1 TABLET (25 MG TOTAL) BY MOUTH DAILY. 90 tablet 3  . ibuprofen (ADVIL,MOTRIN) 200 MG tablet Take 600 mg by mouth every 8 (eight) hours as needed.    Marland Kitchen losartan (COZAAR) 100 MG tablet Take 100 mg by mouth every evening.     . Magnesium Oxide 250 MG TABS Take 250 mg by mouth daily.    . nicotine (NICODERM CQ - DOSED IN MG/24 HOURS) 21 mg/24hr patch Place 21 mg onto the skin daily as needed.    . TURMERIC PO Take 1 tablet by mouth daily.    Marland Kitchen zolpidem (AMBIEN CR) 12.5 MG CR tablet Take 12.5 mg by mouth at bedtime as needed for sleep.     No current facility-administered medications for this visit.     Allergies:   Hydrocodone-acetaminophen   Social History:  The patient  reports that she quit smoking about 22 months ago. Her smoking use included cigarettes. She has never used smokeless tobacco. She reports that she drinks alcohol. She reports that she does not use drugs.   Family History:  The patient's family history includes COPD in her father and mother; Cancer in her sister; Heart attack in her maternal grandfather.  ROS:  Please see the history of present illness.   Otherwise, review of systems is positive for none.   All other systems are reviewed and negative.   PHYSICAL EXAM: VS:  BP 136/76   Pulse (!) 105   Ht  (1.575 m)   Wt 148 lb (67.1 kg)   BMI 27.07 kg/m  , BMI Body mass index is 27.07 kg/m. GEN: Well nourished, well developed, in no acute distress  HEENT: normal  Neck: no JVD, carotid bruits, or masses Cardiac: RRR; no murmurs, rubs, or gallops,no edema  Respiratory:  clear to auscultation bilaterally, normal work of breathing GI: soft, nontender, nondistended, + BS MS: no deformity or atrophy  Skin: warm and  dry Neuro:  Strength and sensation are intact Psych: euthymic mood, full affect  EKG:  EKG is not ordered today. Personal review of the ekg ordered 02/19/17 shows SR, rate 109  Recent Labs: 02/19/2017: BUN 26; Creatinine, Ser 1.03; Hemoglobin 12.1; Platelets PLATELETS APPEAR ADEQUATE; Potassium 4.5; Sodium 138    Lipid Panel  No results found for: CHOL, TRIG, HDL, CHOLHDL, VLDL, LDLCALC, LDLDIRECT   Wt Readings from Last 3 Encounters:  10/02/17 148 lb (67.1 kg)  03/31/17 156 lb 3.2 oz (70.9 kg)  02/20/17 164 lb 3.2 oz (74.5 kg)      Other studies Reviewed: Additional studies/ records that were reviewed today include: SPECT 02/28/16  The left ventricular ejection fraction is hyperdynamic (>65%).  Nuclear stress EF: 77%.  There was no ST segment deviation noted during stress.  The study is normal.  This is a low risk study.    ASSESSMENT AND PLAN:  1.  Hypertension: Pressure mildly above goal today.  She does bring in blood pressures from home that have been in the 120s to 130s.  At this point, she is comfortable with her pressures where they are and does not wish to have any further changes.  Did recommend her to start an exercise regimen of 150 minutes a week of moderate exercise.  2. Hyperlipidemia: Continues to decline statin therapy.  No changes.  Current medicines are reviewed at length with the patient today.   The patient does not have concerns regarding her medicines.  The following changes were made today:   None  Labs/ tests ordered today include:  No orders of the defined types were placed in this encounter.    Disposition:   FU with Amarah Brossman 6 months  Signed, Jahbari Repinski Jorja Loa, MD  10/02/2017 4:13 PM     [facility] [address] [place] [address] [phone] (office) [phone] (fax)

## 2017-10-19 ENCOUNTER — Other Ambulatory Visit: Payer: Self-pay | Admitting: Cardiology

## 2017-10-24 IMAGING — DX DG SHOULDER 2+V*L*
2 series · 2 of 2 positions shown · non-contrast
Comparison: None.

CLINICAL DATA: Fall today with shoulder pain.  Initial encounter.

EXAM:
LEFT SHOULDER - 2+ VIEW

[shoulder grashey]
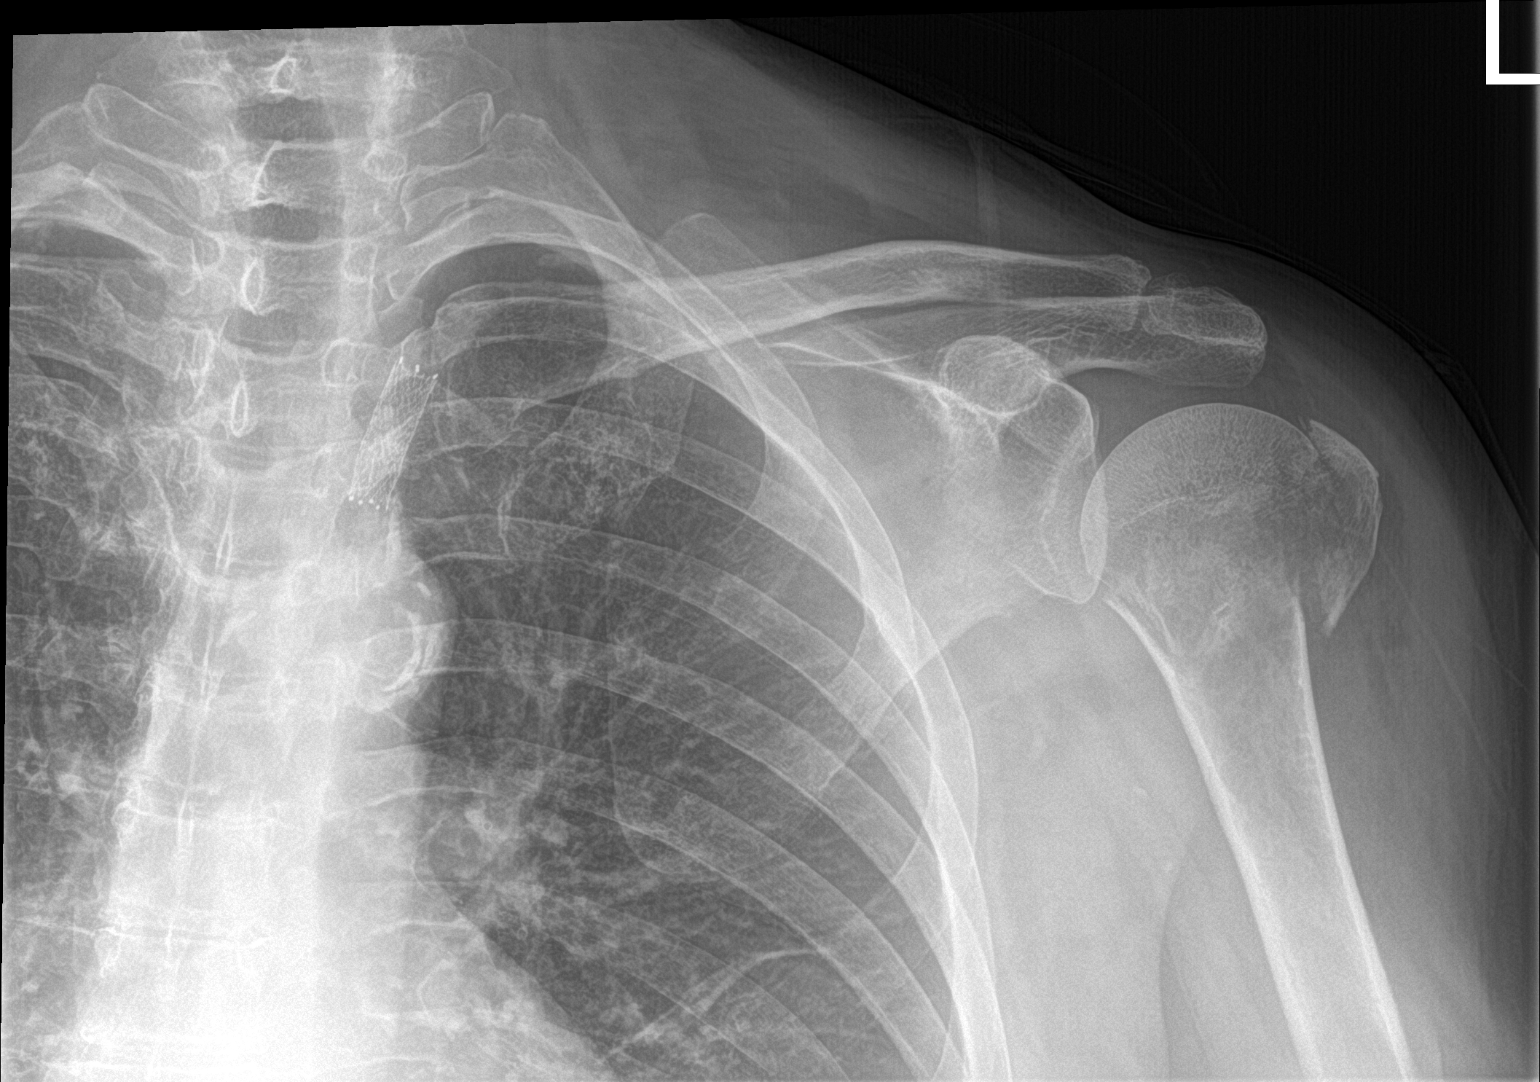

[shoulder y view]
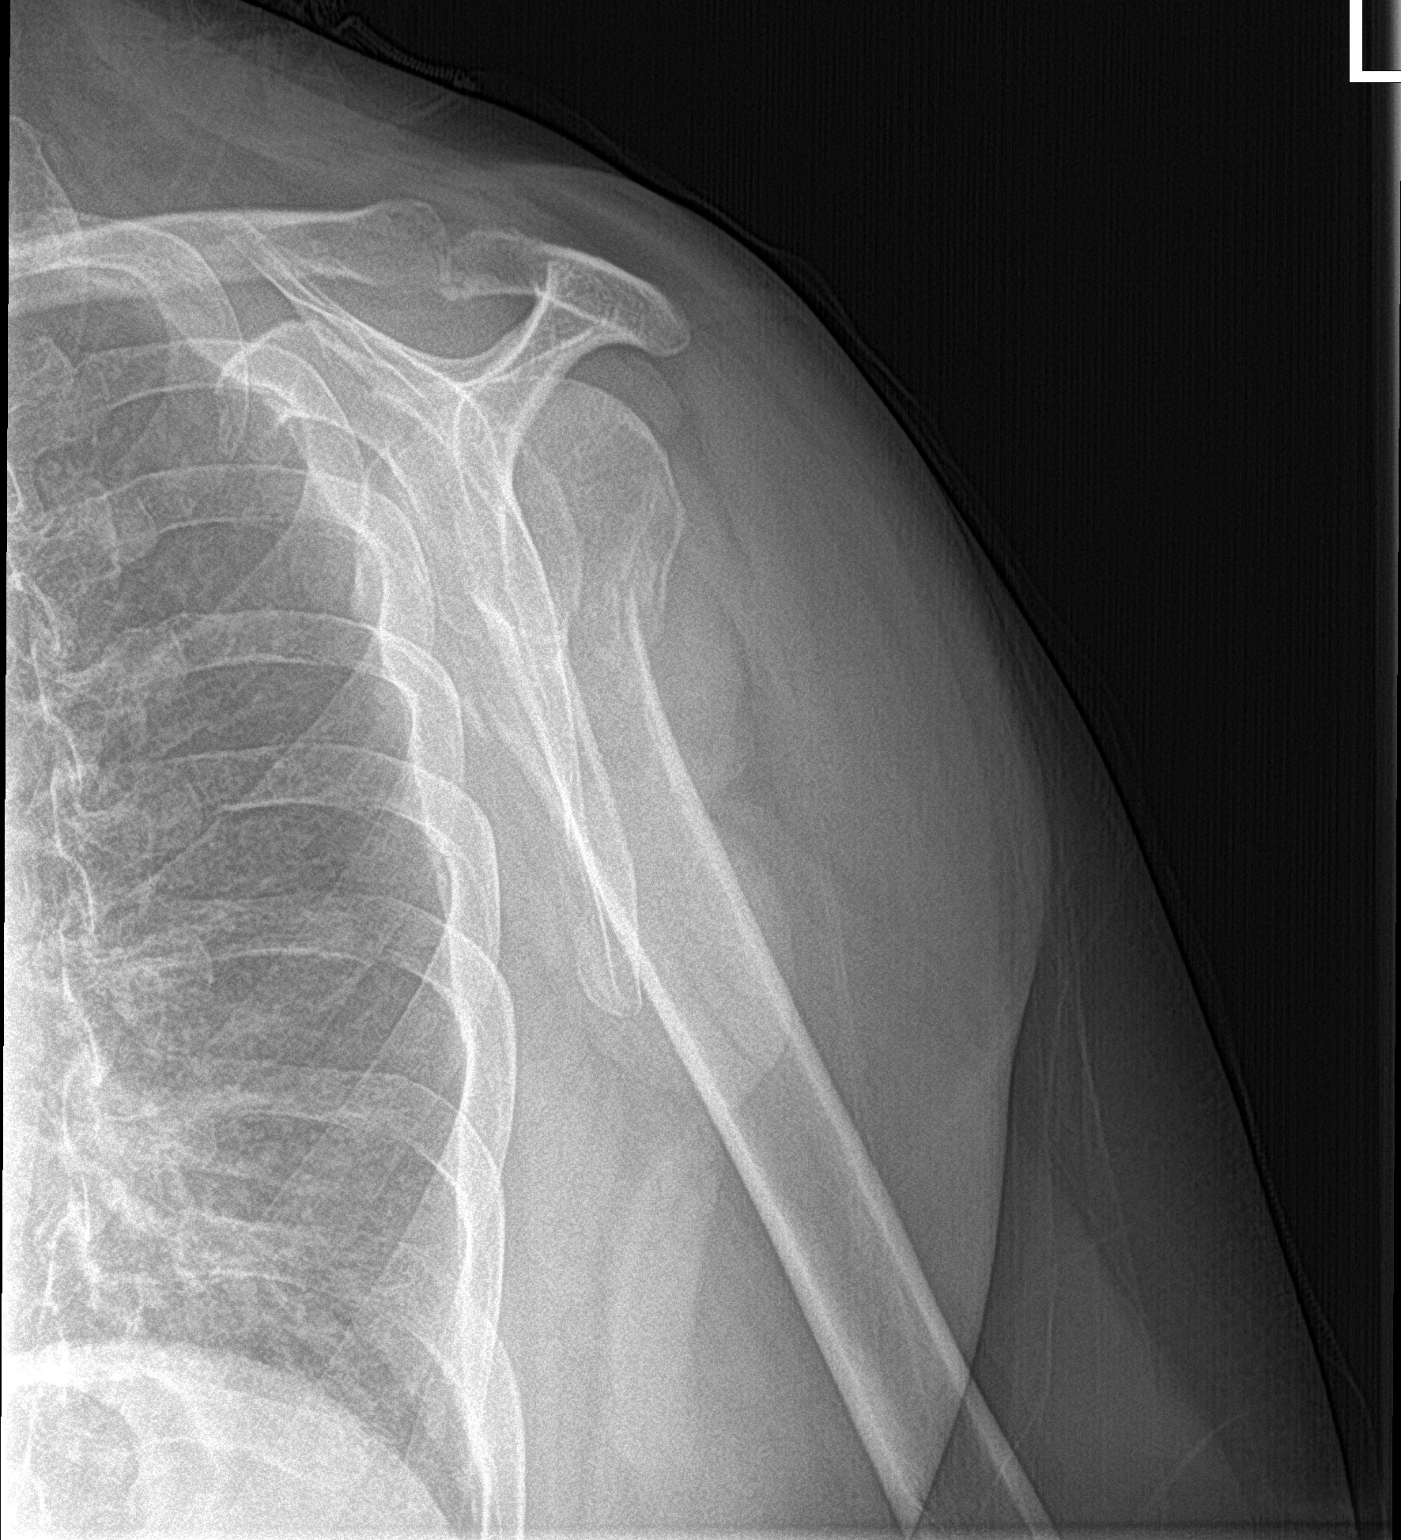

[2 of 2 positions shown; findings below may reference images not displayed]

FINDINGS: Transverse fracture across the surgical neck of the humerus with up
to 3 mm of anterior displacement and 7 mm of posterior impaction.
There is fragmentation of the greater tuberosity with of to 3 mm of
offset when measured superiorly. No dislocation. Normal alignment at
the acromioclavicular joint. Great vessels stent, likely subclavian
artery.
IMPRESSION: Humeral neck and greater tuberosity fracture with displacement
described above.

## 2018-03-31 ENCOUNTER — Ambulatory Visit: Payer: PRIVATE HEALTH INSURANCE | Admitting: Cardiology

## 2018-03-31 ENCOUNTER — Encounter: Payer: Self-pay | Admitting: Cardiology

## 2018-03-31 VITALS — BP 128/76 | HR 112 | Ht 62.0 in | Wt 146.0 lb

## 2018-03-31 DIAGNOSIS — E785 Hyperlipidemia, unspecified: Secondary | ICD-10-CM

## 2018-03-31 DIAGNOSIS — I10 Essential (primary) hypertension: Secondary | ICD-10-CM | POA: Diagnosis not present

## 2018-03-31 NOTE — Progress Notes (Signed)
Electrophysiology Office Note   Date:  03/31/2018   ID:  Kristin Schmidt, DOB [birth year], MRN [id number]  PCP:  Eather Colas, FNP  Cardiologist:   Regan Lemming, MD    No chief complaint on file.    History of Present Illness: Kristin Schmidt is a 62 y.o. female who presents today for electrophysiology evaluation.   PMH of hypertension, atherosclerosis (underwent cardiac cath in [place] in 2007 and had renal artery stent placed, as well as a stent to her left subclavian), and 30 pack year smoking history. Presented to the ER with SOB and chest tightness. Myoview was low risk with a normal LVEF.She has had no further episodes of chest pain since her Myoview was performed.   Today, denies symptoms of palpitations, chest pain, shortness of breath, orthopnea, PND, lower extremity edema, claudication, dizziness, presyncope, syncope, bleeding, or neurologic sequela. The patient is tolerating medications without difficulties.  Overall she is doing well.  She has no chest pain or shortness of breath.  She is able to do all of her daily activities without restriction.   Past Medical History:  Diagnosis Date  . Coronary artery disease 2007   renal artery stent and subclavian artery stent - [place]  . Hypertension   . Pneumonia    "walking pneumonia"  . Renal disorder   . Tachycardia    pt was treated with Carvedilol, taken off of it in May, 2018 due to feeling fatigued.   Past Surgical History:  Procedure Laterality Date  . CESAREAN SECTION    . FRACTURE SURGERY Left    leg  . ORIF HUMERUS FRACTURE Left 02/20/2017   Procedure: OPEN REDUCTION INTERNAL FIXATION (ORIF) LEFT PROXIMAL HUMERUS FRACTURE;  Surgeon: Francena Hanly, MD;  Location: [facility] OR;  Service: Orthopedics;  Laterality: Left;  . stent placement right kidney        Current Outpatient Medications  Medication Sig Dispense Refill  . amLODipine (NORVASC) 10 MG tablet TAKE 1 TABLET BY MOUTH EVERY DAY 90 tablet  3  . diltiazem (CARDIZEM CD) 180 MG 24 hr capsule TAKE 1 CAPSULE BY MOUTH EVERY DAY 90 capsule 3  . hydrochlorothiazide (HYDRODIURIL) 25 MG tablet TAKE 1 TABLET (25 MG TOTAL) BY MOUTH DAILY. 90 tablet 3  . ibuprofen (ADVIL,MOTRIN) 200 MG tablet Take 600 mg by mouth every 8 (eight) hours as needed.    Marland Kitchen losartan (COZAAR) 100 MG tablet Take 100 mg by mouth every evening.     . Magnesium Oxide 250 MG TABS Take 250 mg by mouth daily.    . nicotine (NICODERM CQ - DOSED IN MG/24 HOURS) 21 mg/24hr patch Place 21 mg onto the skin daily as needed.    . TURMERIC PO Take 1 tablet by mouth daily.    Marland Kitchen zolpidem (AMBIEN CR) 12.5 MG CR tablet Take 12.5 mg by mouth at bedtime as needed for sleep.     No current facility-administered medications for this visit.     Allergies:   Hydrocodone-acetaminophen   Social History:  The patient  reports that she quit smoking about 2 years ago. Her smoking use included cigarettes. She has never used smokeless tobacco. She reports that she drinks alcohol. She reports that she does not use drugs.   Family History:  The patient's family history includes COPD in her father and mother; Cancer in her sister; Heart attack in her maternal grandfather.   ROS:  Please see the history of present illness.   Otherwise, review of systems is  positive for none.   All other systems are reviewed and negative.   PHYSICAL EXAM: VS:  BP 128/76   Pulse (!) 112   Ht 5\' 2"  (1.575 m)   Wt 146 lb (66.2 kg)   BMI 26.70 kg/m  , BMI Body mass index is 26.7 kg/m. GEN: Well nourished, well developed, in no acute distress  HEENT: normal  Neck: no JVD, carotid bruits, or masses Cardiac: Tachycardic, regular; no murmurs, rubs, or gallops,no edema  Respiratory:  clear to auscultation bilaterally, normal work of breathing GI: soft, nontender, nondistended, + BS MS: no deformity or atrophy  Skin: warm and dry Neuro:  Strength and sensation are intact Psych: euthymic mood, full affect  EKG:   EKG is ordered today. Personal review of the ekg ordered shows sinus tachycardia, rate 112  Recent Labs: No results found for requested labs within last 8760 hours.    Lipid Panel  No results found for: CHOL, TRIG, HDL, CHOLHDL, VLDL, LDLCALC, LDLDIRECT   Wt Readings from Last 3 Encounters:  03/31/18 146 lb (66.2 kg)  10/02/17 148 lb (67.1 kg)  03/31/17 156 lb 3.2 oz (70.9 kg)      Other studies Reviewed: Additional studies/ records that were reviewed today include: SPECT 02/28/16  The left ventricular ejection fraction is hyperdynamic (>65%).  Nuclear stress EF: 77%.  There was no ST segment deviation noted during stress.  The study is normal.  This is a low risk study.    ASSESSMENT AND PLAN:  1.  Hypertension: Well-controlled today.  She brings in blood pressure recordings which show home blood pressures mainly in the 1 teens to 120s.  No changes.  2. Hyperlipidemia: Continues to decline statin therapy.  She has follow-up with her primary physician.  She has been working to control this with diet.  3.  Sinus tachycardia: Patient is asymptomatic.  This could also be an atrial tachycardia.  Since she is asymptomatic.  No changes.  Current medicines are reviewed at length with the patient today.   The patient does not have concerns regarding her medicines.  The following changes were made today: None  Labs/ tests ordered today include:  Orders Placed This Encounter  Procedures  . EKG 12-Lead     Disposition:   FU with Kristin Schmidt 12 months  Signed, Kristin Schmidt Jorja LoaMartin Garland Hincapie, MD  03/31/2018 3:56 PM     [facility] [address] [place] [address] [phone] (office) [phone] (fax)

## 2018-03-31 NOTE — Patient Instructions (Signed)
Medication Instructions:  Your physician recommends that you continue on your current medications as directed. Please refer to the Current Medication list given to you today.  * If you need a refill on your cardiac medications before your next appointment, please call your pharmacy.   Labwork: None ordered  Testing/Procedures: None ordered  Follow-Up: Your physician wants you to follow-up in: 1 year with Dr. Camnitz.  You will receive a reminder letter in the mail two months in advance. If you don't receive a letter, please call our office to schedule the follow-up appointment.  Thank you for choosing [facility]!!   Sherri Price, RN [phone]        

## 2018-05-21 ENCOUNTER — Other Ambulatory Visit: Payer: Self-pay | Admitting: Cardiology

## 2018-08-20 ENCOUNTER — Other Ambulatory Visit: Payer: Self-pay | Admitting: Cardiology

## 2018-08-20 MED ORDER — DILTIAZEM HCL ER COATED BEADS 180 MG PO CP24: ORAL_CAPSULE | ORAL | 2 refills | Status: DC

## 2018-10-20 ENCOUNTER — Other Ambulatory Visit: Payer: Self-pay | Admitting: Cardiology

## 2018-10-20 MED ORDER — AMLODIPINE BESYLATE 10 MG PO TABS: 10.0000 mg | ORAL_TABLET | Freq: Every day | ORAL | 1 refills | Status: DC

## 2018-10-22 DIAGNOSIS — Z0000 Encounter for general adult medical examination without abnormal findings: Secondary | ICD-10-CM | POA: Diagnosis not present

## 2018-10-22 DIAGNOSIS — I10 Essential (primary) hypertension: Secondary | ICD-10-CM | POA: Diagnosis not present

## 2018-10-22 DIAGNOSIS — E785 Hyperlipidemia, unspecified: Secondary | ICD-10-CM | POA: Diagnosis not present

## 2018-10-22 DIAGNOSIS — G4700 Insomnia, unspecified: Secondary | ICD-10-CM | POA: Diagnosis not present

## 2018-12-01 DIAGNOSIS — E785 Hyperlipidemia, unspecified: Secondary | ICD-10-CM | POA: Diagnosis not present

## 2018-12-01 DIAGNOSIS — Z1159 Encounter for screening for other viral diseases: Secondary | ICD-10-CM | POA: Diagnosis not present

## 2018-12-01 DIAGNOSIS — H6121 Impacted cerumen, right ear: Secondary | ICD-10-CM | POA: Diagnosis not present

## 2018-12-01 DIAGNOSIS — I10 Essential (primary) hypertension: Secondary | ICD-10-CM | POA: Diagnosis not present

## 2019-01-25 ENCOUNTER — Other Ambulatory Visit: Payer: Self-pay | Admitting: Cardiology

## 2019-02-24 ENCOUNTER — Other Ambulatory Visit: Payer: Self-pay | Admitting: Cardiology

## 2019-03-12 IMAGING — RF DG HUMERUS 2V *L*
1 series · 3 of 3 positions shown · non-contrast
Comparison: 02/11/2017

CLINICAL DATA: ORIF left humerus

EXAM:
DG C-ARM 61-120 MIN; LEFT HUMERUS - 2+ VIEW

[Series 1: run · 3 of 3 slices shown]
[im 1/3]
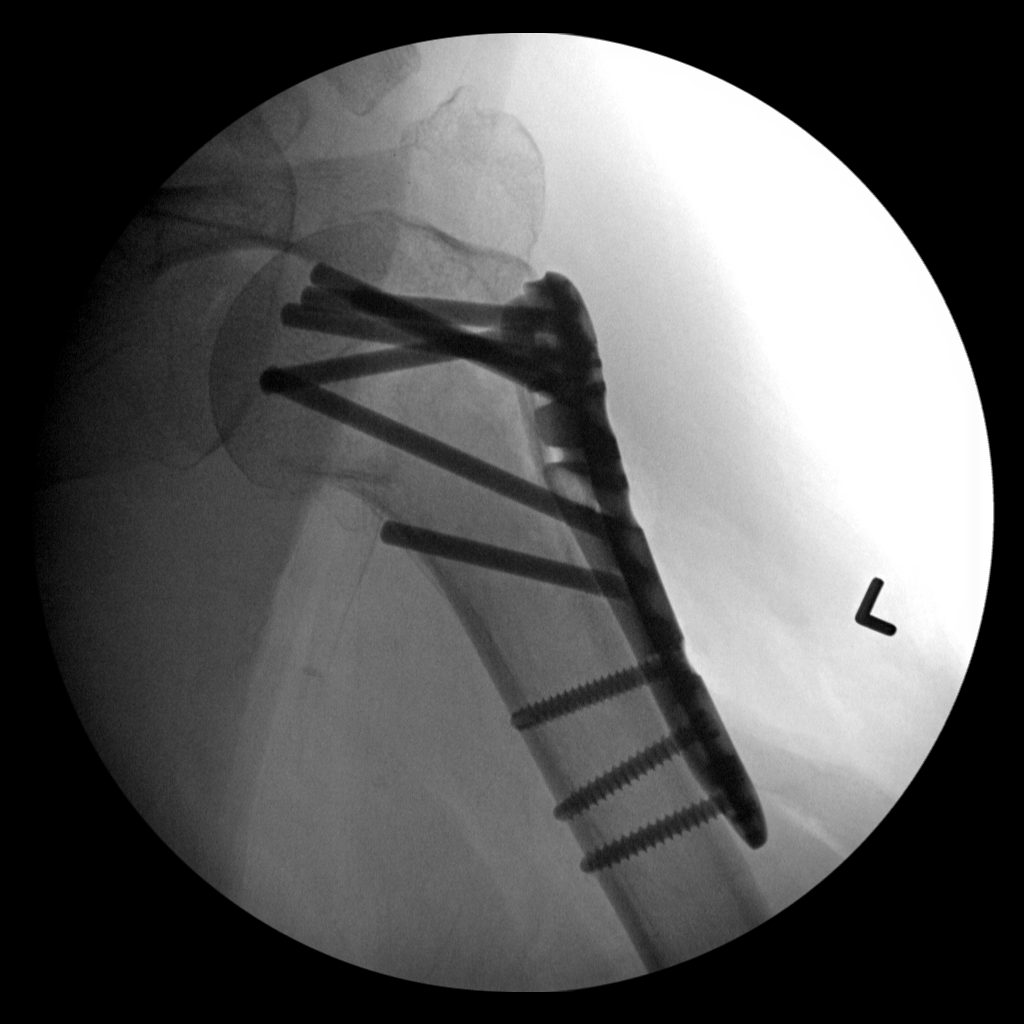
[im 2/3]
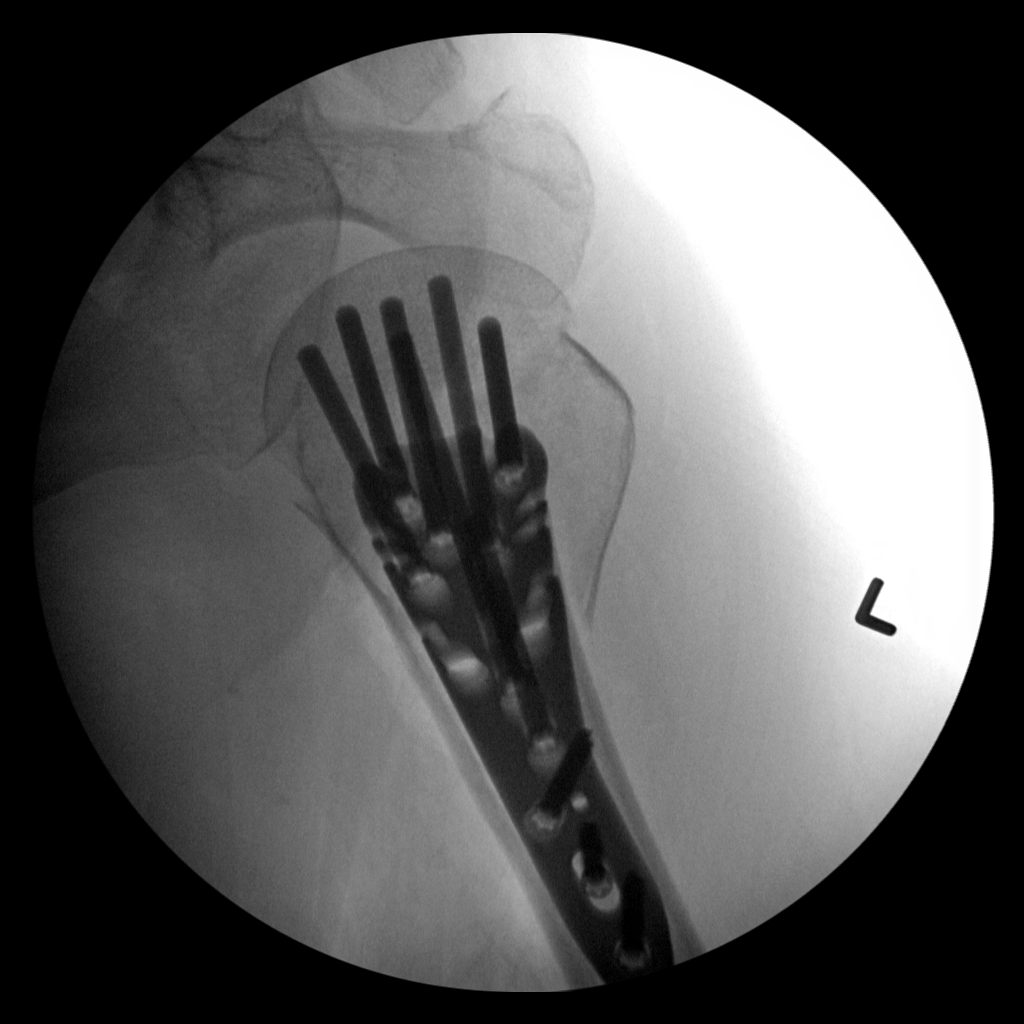
[im 3/3]
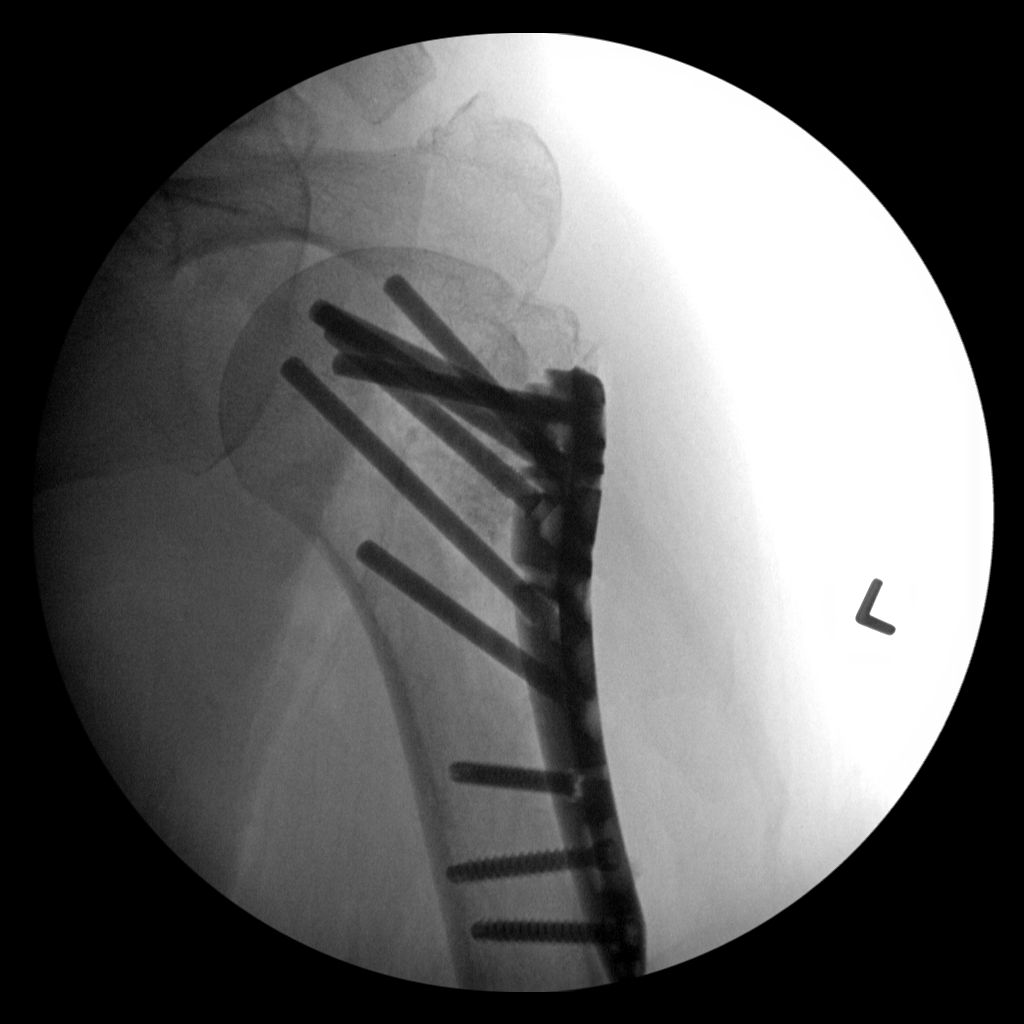

[3 of 3 positions shown; findings below may reference images not displayed]

FINDINGS: Total fluoroscopy time was 49 seconds. Three low resolution spot
intraoperative views of the left humerus. Images demonstrate
surgical plate and multiple screw fixation of a proximal humerus
fracture with anatomic alignment.
IMPRESSION: Intraoperative fluoroscopic assistance provided during surgical
fixation of proximal humerus fracture

## 2019-03-19 ENCOUNTER — Other Ambulatory Visit: Payer: Self-pay

## 2019-03-19 ENCOUNTER — Ambulatory Visit: Payer: PPO | Admitting: Student

## 2019-03-19 VITALS — BP 144/86 | HR 129 | Ht 62.0 in | Wt 146.0 lb

## 2019-03-19 DIAGNOSIS — E785 Hyperlipidemia, unspecified: Secondary | ICD-10-CM | POA: Diagnosis not present

## 2019-03-19 DIAGNOSIS — R000 Tachycardia, unspecified: Secondary | ICD-10-CM | POA: Diagnosis not present

## 2019-03-19 DIAGNOSIS — I10 Essential (primary) hypertension: Secondary | ICD-10-CM

## 2019-03-19 MED ORDER — DILTIAZEM HCL ER COATED BEADS 240 MG PO CP24: 240.0000 mg | ORAL_CAPSULE | Freq: Every day | ORAL | 3 refills | Status: DC

## 2019-03-19 NOTE — Patient Instructions (Addendum)
Medication Instructions:   START TAKING : DILTIAZEM 240 MG ONCE A DAY   *If you need a refill on your cardiac medications before your next appointment, please call your pharmacy*  Lab Work:  BMET  CBC AND TSH   If you have labs (blood work) drawn today and your tests are completely normal, you will receive your results only by: Marland Kitchen MyChart Message (if you have MyChart) OR . A paper copy in the mail If you have any lab test that is abnormal or we need to change your treatment, we will call you to review the results.  Testing/Procedures: Your physician has requested that you have an echocardiogram. Echocardiography is a painless test that uses sound waves to create images of your heart. It provides your doctor with information about the size and shape of your heart and how well your heart's chambers and valves are working. This procedure takes approximately one hour. There are no restrictions for this procedure.   Follow-Up: At [facility], you and your health needs are our priority.  As part of our continuing mission to provide you with exceptional heart care, we have created designated Provider Care Teams.  These Care Teams include your primary Cardiologist (physician) and Advanced Practice Providers (APPs -  Physician Assistants and Nurse Practitioners) who all work together to provide you with the care you need, when you need it.  Your next appointment:   12 months  The format for your next appointment:   In Person  Provider:   You may see Dr Curt Bears  or one of the following Advanced Practice Providers on your designated Care Team:    Chanetta Marshall, NP  Tommye Standard, PA-C  Legrand Como "Jonni Sanger" Chalmers Cater, [place]   Other Instructions

## 2019-03-19 NOTE — Progress Notes (Signed)
PCP:  Eather Colas, FNP Primary Cardiologist: Will Jorja Loa, MD Electrophysiologist: Dr. Suella Grove Kristin Schmidt is a 63 y.o. female with past medical history of sinus vs atrial tachycardia, HTN, and HLD who presents today for routine electrophysiology followup. They are seen for Dr. Elberta Fortis.   Since last being seen in our clinic, the patient reports doing very well.    She denies symptoms of palpitations, chest pain, shortness of breath, orthopnea, PND, lower extremity edema, claudication, dizziness, presyncope, syncope, bleeding, or neurologic sequela. The patient is tolerating medications without difficulties.    She keeps track of her BP and HR at home once daily. BP runs 100-120s systolic. HRs run 80s-110s. She does not track HR with activity. HR 120s on arrival today. Denies missed medications.   The patient feels that she is tolerating medications without difficulties and is otherwise without complaint today.   Past Medical History:  Diagnosis Date  . Coronary artery disease 2007   renal artery stent and subclavian artery stent - [place]  . Hypertension   . Pneumonia    "walking pneumonia"  . Renal disorder   . Tachycardia    pt was treated with Carvedilol, taken off of it in May, 2018 due to feeling fatigued.   Past Surgical History:  Procedure Laterality Date  . CESAREAN SECTION    . FRACTURE SURGERY Left    leg  . ORIF HUMERUS FRACTURE Left 02/20/2017   Procedure: OPEN REDUCTION INTERNAL FIXATION (ORIF) LEFT PROXIMAL HUMERUS FRACTURE;  Surgeon: Francena Hanly, MD;  Location: [facility] OR;  Service: Orthopedics;  Laterality: Left;  . stent placement right kidney       Current Outpatient Medications  Medication Sig Dispense Refill  . amLODipine (NORVASC) 10 MG tablet Take 1 tablet (10 mg total) by mouth daily. Please make yearly appt with Dr. Elberta Fortis for November for future refills. 1st attempt 90 tablet 0  . diltiazem (CARDIZEM CD) 240 MG 24 hr capsule Take 1  capsule (240 mg total) by mouth daily. 90 capsule 3  . hydrochlorothiazide (HYDRODIURIL) 25 MG tablet Take 1 tablet (25 mg total) by mouth daily. Call [phone] to schedule an appt to continue refills. Thank you. 90 tablet 0  . ibuprofen (ADVIL,MOTRIN) 200 MG tablet Take 600 mg by mouth every 8 (eight) hours as needed.    Marland Kitchen losartan (COZAAR) 100 MG tablet Take 100 mg by mouth every evening.     . Magnesium Oxide 250 MG TABS Take 250 mg by mouth daily.    . nicotine (NICODERM CQ - DOSED IN MG/24 HOURS) 21 mg/24hr patch Place 21 mg onto the skin daily as needed.    . TURMERIC PO Take 1 tablet by mouth daily.    Marland Kitchen zolpidem (AMBIEN CR) 12.5 MG CR tablet Take 12.5 mg by mouth at bedtime as needed for sleep.     No current facility-administered medications for this visit.     Allergies  Allergen Reactions  . Hydrocodone-Acetaminophen Other (See Comments)    Hives    Social History   Socioeconomic History  . Marital status: Single    Spouse name: Not on file  . Number of children: Not on file  . Years of education: Not on file  . Highest education level: Not on file  Occupational History  . Not on file  Social Needs  . Financial resource strain: Not on file  . Food insecurity    Worry: Not on file    Inability: Not on file  .  Transportation needs    Medical: Not on file    Non-medical: Not on file  Tobacco Use  . Smoking status: Former Smoker    Types: Cigarettes    Quit date: 11/20/2015    Years since quitting: 3.3  . Smokeless tobacco: Never Used  Substance and Sexual Activity  . Alcohol use: Yes    Comment: weekends  . Drug use: No  . Sexual activity: Not on file  Lifestyle  . Physical activity    Days per week: Not on file    Minutes per session: Not on file  . Stress: Not on file  Relationships  . Social Herbalist on phone: Not on file    Gets together: Not on file    Attends religious service: Not on file    Active member of club or organization:  Not on file    Attends meetings of clubs or organizations: Not on file    Relationship status: Not on file  . Intimate partner violence    Fear of current or ex partner: Not on file    Emotionally abused: Not on file    Physically abused: Not on file    Forced sexual activity: Not on file  Other Topics Concern  . Not on file  Social History Narrative  . Not on file    Review of Systems: General: No chills, fever, night sweats or weight changes  Cardiovascular:  No chest pain, dyspnea on exertion, edema, orthopnea, palpitations, paroxysmal nocturnal dyspnea Dermatological: No rash, lesions or masses Respiratory: No cough, dyspnea Urologic: No hematuria, dysuria Abdominal: No nausea, vomiting, diarrhea, bright red blood per rectum, melena, or hematemesis Neurologic: No visual changes, weakness, changes in mental status All other systems reviewed and are otherwise negative except as noted above.  Physical Exam: Vitals:   03/19/19 1303  BP: (!) 144/86  Pulse: (!) 129  Weight: 146 lb (66.2 kg)  Height: 5\' 2"  (1.575 m)    GEN- The patient is well appearing, alert and oriented x 3 today.   HEENT: normocephalic, atraumatic; sclera clear, conjunctiva pink; hearing intact; oropharynx clear; neck supple, no JVP Lymph- no cervical lymphadenopathy Lungs- Clear to ausculation bilaterally, normal work of breathing.  No wheezes, rales, rhonchi Heart- Regular rate and rhythm, no murmurs, rubs or gallops, PMI not laterally displaced GI- soft, non-tender, non-distended, bowel sounds present, no hepatosplenomegaly Extremities- no clubbing, cyanosis, or edema; DP/PT/radial pulses 2+ bilaterally MS- no significant deformity or atrophy Skin- warm and dry, no rash or lesion Psych- euthymic mood, full affect Neuro- strength and sensation are intact  EKG is ordered. Personal review of EKG from today shows sinus tachycardia at 129 bpm, personally reviewed  Assessment and Plan:  1. Sinus  tachycardia Mostly asymptomatic Increase diltiazem to 240 mg daily.  Will check echo with persistent tachycardia.  Continue to follow. May be an atrial tach.  Will check BMET, CBC, and TSH today to look for additional etiology.   2. HTN Meds as above.   3. HLD Has declined statin therapy. Further per PCP  If work up today unremarkable, continue yearly follow up with Dr. Curt Bears. I encouraged her to get a fitbit or applewatch to keep track of her HRs with activity.   Shirley Friar, PA-C  03/19/19 1:29 PM

## 2019-03-20 LAB — BASIC METABOLIC PANEL
BUN/Creatinine Ratio: 30 — ABNORMAL HIGH (ref 12–28)
BUN: 20 mg/dL (ref 8–27)
CO2: 26 mmol/L (ref 20–29)
Calcium: 10.1 mg/dL (ref 8.7–10.3)
Chloride: 94 mmol/L — ABNORMAL LOW (ref 96–106)
Creatinine, Ser: 0.67 mg/dL (ref 0.57–1.00)
GFR calc Af Amer: 107 mL/min/{1.73_m2} (ref >59)
GFR calc non Af Amer: 93 mL/min/{1.73_m2} (ref >59)
Glucose: 108 mg/dL — ABNORMAL HIGH (ref 65–99)
Potassium: 3.7 mmol/L (ref 3.5–5.2)
Sodium: 140 mmol/L (ref 134–144)

## 2019-03-20 LAB — CBC
Hematocrit: 39.1 % (ref 34.0–46.6)
Hemoglobin: 13.6 g/dL (ref 11.1–15.9)
MCH: 32.5 pg (ref 26.6–33.0)
MCHC: 34.8 g/dL (ref 31.5–35.7)
MCV: 94 fL (ref 79–97)
Platelets: 323 10*3/uL (ref 150–450)
RBC: 4.18 x10E6/uL (ref 3.77–5.28)
RDW: 13.3 % (ref 11.7–15.4)
WBC: 9.3 10*3/uL (ref 3.4–10.8)

## 2019-03-20 LAB — TSH: TSH: 2.35 u[IU]/mL (ref 0.450–4.500)

## 2019-04-16 ENCOUNTER — Ambulatory Visit (HOSPITAL_COMMUNITY): Payer: PPO | Attending: Cardiovascular Disease

## 2019-04-16 DIAGNOSIS — E785 Hyperlipidemia, unspecified: Secondary | ICD-10-CM | POA: Diagnosis not present

## 2019-04-16 DIAGNOSIS — I358 Other nonrheumatic aortic valve disorders: Secondary | ICD-10-CM | POA: Diagnosis not present

## 2019-04-16 DIAGNOSIS — I2510 Atherosclerotic heart disease of native coronary artery without angina pectoris: Secondary | ICD-10-CM | POA: Diagnosis not present

## 2019-04-16 DIAGNOSIS — N289 Disorder of kidney and ureter, unspecified: Secondary | ICD-10-CM | POA: Diagnosis not present

## 2019-04-16 DIAGNOSIS — R000 Tachycardia, unspecified: Secondary | ICD-10-CM | POA: Diagnosis not present

## 2019-04-16 DIAGNOSIS — I10 Essential (primary) hypertension: Secondary | ICD-10-CM | POA: Insufficient documentation

## 2019-04-20 ENCOUNTER — Telehealth: Payer: Self-pay

## 2019-04-20 ENCOUNTER — Other Ambulatory Visit: Payer: Self-pay

## 2019-04-20 MED ORDER — DILTIAZEM HCL ER COATED BEADS 180 MG PO CP24: 180.0000 mg | ORAL_CAPSULE | Freq: Every day | ORAL | 3 refills | Status: DC

## 2019-04-20 NOTE — Telephone Encounter (Signed)
The diltiazem decrease is fine,  Thank you!

## 2019-04-20 NOTE — Telephone Encounter (Signed)
The patient has been notified of the Echo result and verbalized understanding.  All questions (if any) were answered. Frederik Schmidt, RN 04/20/2019 3:14 PM   The patient said that her Diltiazem was recently increased to 240 mg Daily and that she cannot take that much, it makes her feel "funny".  She would like to go back to 180 mg Daily and monitor HR.

## 2019-04-27 DIAGNOSIS — G4700 Insomnia, unspecified: Secondary | ICD-10-CM | POA: Diagnosis not present

## 2019-04-27 DIAGNOSIS — I10 Essential (primary) hypertension: Secondary | ICD-10-CM | POA: Diagnosis not present

## 2019-06-09 ENCOUNTER — Other Ambulatory Visit: Payer: Self-pay | Admitting: Cardiology

## 2019-06-09 MED ORDER — DILTIAZEM HCL ER COATED BEADS 180 MG PO CP24: 180.0000 mg | ORAL_CAPSULE | Freq: Every day | ORAL | 3 refills | Status: DC

## 2019-06-09 NOTE — Telephone Encounter (Signed)
*  STAT* If patient is at the pharmacy, call can be transferred to refill team.   1. Which medications need to be refilled? (please list name of each medication and dose if known) diltiazem (CARDIZEM CD) 180 MG 24 hr capsule  2. Which pharmacy/location (including street and city if local pharmacy) is medication to be sent to? [id number] - [place], [place] - [address]  3. Do they need a 30 day or 90 day supply? 90 day

## 2019-06-25 ENCOUNTER — Other Ambulatory Visit: Payer: Self-pay | Admitting: Cardiology

## 2019-06-25 MED ORDER — AMLODIPINE BESYLATE 10 MG PO TABS: 10.0000 mg | ORAL_TABLET | Freq: Every day | ORAL | 2 refills | Status: DC

## 2019-07-02 ENCOUNTER — Other Ambulatory Visit: Payer: Self-pay

## 2019-07-02 MED ORDER — HYDROCHLOROTHIAZIDE 25 MG PO TABS: 25.0000 mg | ORAL_TABLET | Freq: Every day | ORAL | 3 refills | Status: DC

## 2019-10-26 DIAGNOSIS — E785 Hyperlipidemia, unspecified: Secondary | ICD-10-CM | POA: Diagnosis not present

## 2019-10-26 DIAGNOSIS — I10 Essential (primary) hypertension: Secondary | ICD-10-CM | POA: Diagnosis not present

## 2019-10-26 DIAGNOSIS — Z0000 Encounter for general adult medical examination without abnormal findings: Secondary | ICD-10-CM | POA: Diagnosis not present

## 2019-10-26 DIAGNOSIS — G4700 Insomnia, unspecified: Secondary | ICD-10-CM | POA: Diagnosis not present

## 2019-10-26 DIAGNOSIS — Z2821 Immunization not carried out because of patient refusal: Secondary | ICD-10-CM | POA: Diagnosis not present

## 2019-10-26 DIAGNOSIS — Z5320 Procedure and treatment not carried out because of patient's decision for unspecified reasons: Secondary | ICD-10-CM | POA: Diagnosis not present

## 2019-11-05 ENCOUNTER — Telehealth: Payer: Self-pay | Admitting: Cardiology

## 2019-11-05 NOTE — Telephone Encounter (Signed)
Informed pt that I would forward to Dr. Elberta Fortis for advisement/approval.  She reports that she is not going to take this anymore d/t gum issues.  Aware that I will not remove it from her medication list until [employer]. She wants to know if she can remain on the Diltiazem 180 mg (couldn't tolerate 240) and monitor her BPs.   She is agreeable to calling the office if BP begin to rise after stopping Amlodipine. She is aware it may be 7/12 or after before hearing back from me on this matter being that both Dr. Elberta Fortis & I are out of the office next week. Patient verbalized understanding and agreeable to plan.

## 2019-11-05 NOTE — Telephone Encounter (Signed)
New message   Pt c/o medication issue:  1. Name of Medication: amLODipine (NORVASC) 10 MG tablet  2. How are you currently taking this medication (dosage and times per day)? As written   3. Are you having a reaction (difficulty breathing--STAT)? No   4. What is your medication issue? Patient states that her dentist states that this medication is causing her gums to thicken. Please advise.

## 2019-11-26 ENCOUNTER — Telehealth: Payer: Self-pay | Admitting: Cardiology

## 2019-11-26 NOTE — Telephone Encounter (Signed)
Patient is requesting to speak with Dr. Gershon Crane nurse. She states she would like to inform Dr. Gershon Crane nurse that she is feeling great and increasing the diltiazem (CARDIZEM CD) 180 MG 24 hr capsule has helped with her symptoms. She would like a call back to discuss.

## 2019-11-29 NOTE — Telephone Encounter (Signed)
Returned pt call. She reports she is doing fine on the Diltiazem (after stopping Norvasc).   She is keeping track of her BP & HRs, states they are continuing to stay WNL. Pt will call office back to update [place] if this changes.

## 2019-12-31 DIAGNOSIS — E785 Hyperlipidemia, unspecified: Secondary | ICD-10-CM | POA: Diagnosis not present

## 2019-12-31 DIAGNOSIS — R739 Hyperglycemia, unspecified: Secondary | ICD-10-CM | POA: Diagnosis not present

## 2020-01-06 DIAGNOSIS — E785 Hyperlipidemia, unspecified: Secondary | ICD-10-CM | POA: Diagnosis not present

## 2020-03-19 NOTE — Progress Notes (Signed)
Cardiology Office Note Date:  03/20/2020  Patient ID:  Kristin Schmidt, DOB 22-Aug-1955, MRN [id number] PCP:  Eather Colas, FNP  Cardiologist/Electrophysiologist: Dr. Elberta Fortis     Chief Complaint: annual visit ??  History of Present Illness: Kristin Schmidt is a 64 y.o. female with history of HTN, atherosclerosis (underwent cardiac cath in [place] in 2007 and had renal artery stent placed, as well as a stent to her left subclavian), sinus vs atrial tachycardia. 2017 presented to the ER with CP, underwent negative stress test.  She quit smoking 2016 though still wears nicotine patches, mentions everyone at work smokes and is still hard for her  She comes in today to be seen for dr. Elberta Fortis, last seen by him Nov 2019, she was doing well, no symptoms of her tachycardia, BP controlled, no changes were made, following herlipids with PMD.  Most recently saw A. Coralee North, [place] Nov 2020, she arrived in [place] 129bpm, the patient reporting better at home, her dilt increased to 240mg  daily and planned for echo, labs She was encouraged to get a wearable HR tracker Labs looked OK, TSH was wnl Echo noted preserved LVEF, no significant VHD  TODAY She is doing well. No CP, palpitations or cardiac awareness She denies any dizzy spells, no near syncope or syncope. She exercises sounds mostly chair exercises and stretching denies any difficulty with her ADLs No SOB or DOE  Her primary started her this year on lipitor, but she developed aching muscles especially in her back that resolved when she took a week or so off it.    Past Medical History:  Diagnosis Date  . Coronary artery disease 2007   renal artery stent and subclavian artery stent - 2008  . Hypertension   . Pneumonia    "walking pneumonia"  . Renal disorder   . Tachycardia    pt was treated with Carvedilol, taken off of it in May, 2018 due to feeling fatigued.    Past Surgical History:  Procedure Laterality Date  .  CESAREAN SECTION    . FRACTURE SURGERY Left    leg  . ORIF HUMERUS FRACTURE Left 02/20/2017   Procedure: OPEN REDUCTION INTERNAL FIXATION (ORIF) LEFT PROXIMAL HUMERUS FRACTURE;  Surgeon: 02/22/2017, MD;  Location: [facility] OR;  Service: Orthopedics;  Laterality: Left;  . stent placement right kidney       Current Outpatient Medications  Medication Sig Dispense Refill  . atorvastatin (LIPITOR) 20 MG tablet Take 20 mg by mouth daily.    Francena Hanly diltiazem (CARDIZEM CD) 240 MG 24 hr capsule Take 240 mg by mouth daily.    . hydrochlorothiazide (HYDRODIURIL) 25 MG tablet Take 1 tablet (25 mg total) by mouth daily. 90 tablet 3  . losartan (COZAAR) 100 MG tablet Take 100 mg by mouth every evening.     . Magnesium Oxide 250 MG TABS Take 250 mg by mouth daily.    . melatonin 5 MG TABS Take 5 mg by mouth daily.    . nicotine (NICODERM CQ - DOSED IN MG/24 HOURS) 21 mg/24hr patch Place 21 mg onto the skin daily as needed.    . TURMERIC PO Take 1 tablet by mouth daily.    Marland Kitchen zolpidem (AMBIEN CR) 12.5 MG CR tablet Take 12.5 mg by mouth at bedtime as needed for sleep.     No current facility-administered medications for this visit.    Allergies:   Hydrocodone-acetaminophen   Social History:  The patient  reports that she quit smoking about  4 years ago. Her smoking use included cigarettes. She has never used smokeless tobacco. She reports current alcohol use. She reports that she does not use drugs.   Family History:  The patient's family history includes COPD in her father and mother; Cancer in her sister; Heart attack in her maternal grandfather.   ROS:  Please see the history of present illness.    All other systems are reviewed and otherwise negative.   PHYSICAL EXAM:  VS:  BP (!) 144/82   Pulse (!) 101   Ht 5\' 2"  (1.575 m)   Wt 147 lb 9.6 oz (67 kg)   SpO2 97%   BMI 27.00 kg/m  BMI: Body mass index is 27 kg/m. Well nourished, well developed, in no acute distress HEENT: normocephalic,  atraumatic Neck: no JVD, carotid bruits or masses Cardiac:  RRR; no significant murmurs, no rubs, or gallops Lungs:  CTA b/l, no wheezing, rhonchi or rales Abd: soft, nontender MS: no deformity or atrophy Ext:  no edema Skin: warm and dry, no rash Neuro:  No gross deficits appreciated Psych: euthymic mood, full affect   EKG:  Done today and reviewed by myself shows  ST 101bpm, slower, otherwise unchanged  IMPRESSIONS  1. Left ventricular ejection fraction, by visual estimation, is 55 to  60%. The left ventricle has normal function. Left ventricular septal wall  thickness was mildly increased. There is no left ventricular hypertrophy.  2. The left ventricle has no regional wall motion abnormalities.  3. Global right ventricle has normal systolic function.The right  ventricular size is normal. No increase in right ventricular wall  thickness.  4. Left atrial size was normal.  5. Right atrial size was normal.  6. The mitral valve is normal in structure. Trivial mitral valve  regurgitation. No evidence of mitral stenosis.  7. The tricuspid valve is normal in structure. Tricuspid valve  regurgitation is not demonstrated.  8. The aortic valve is normal in structure. Aortic valve regurgitation is  not visualized. Mild to moderate aortic valve sclerosis/calcification  without any evidence of aortic stenosis.  9. Calcified left coronary cusp no AS.  10. The pulmonic valve was normal in structure. Pulmonic valve  regurgitation is not visualized.  11. The inferior vena cava is normal in size with greater than 50%  respiratory variability, suggesting right atrial pressure of 3 mmHg.   FINDINGS  Left Ventricle: Left ventricular ejection fraction, by visual estimation,  is 55 to 60%. The left ventricle has normal function. The left ventricle  has no regional wall motion abnormalities. There is no left ventricular  hypertrophy. Normal left atrial  pressure.    SPECT  02/28/16  The left ventricular ejection fraction is hyperdynamic (>65%).  Nuclear stress EF: 77%.  There was no ST segment deviation noted during stress.  The study is normal.  This is a low risk study.    Recent Labs: No results found for requested labs within last 8760 hours.  No results found for requested labs within last 8760 hours.   CrCl cannot be calculated (Patient's most recent lab result is older than the maximum 21 days allowed.).   Wt Readings from Last 3 Encounters:  03/20/20 147 lb 9.6 oz (67 kg)  03/19/19 146 lb (66.2 kg)  03/31/18 146 lb (66.2 kg)     Other studies reviewed: Additional studies/records reviewed today include: summarized above  ASSESSMENT AND PLAN:  1. Sinus vs atrial tachycardia     Encouraged her to try and wean down  her nicotine patch     Home HRs largely 80's, some 90's  2. HTN     Home readings are excellent  3. HLD     She mentions labs were nonfasting and wonders if this made a difference     Discussed heart heathy diet  Disposition: F/u with her PMD for her lipids, she sees her soon, we will continue to see her annually, sooner if needed  Current medicines are reviewed at length with the patient today.  The patient did not have any concerns regarding medicines.  Norma Fredrickson, PA-C 03/20/2020 4:31 PM     [facility] [address] [place] [address] [phone] (office)  [phone] (fax)

## 2020-03-20 ENCOUNTER — Ambulatory Visit: Payer: PPO | Admitting: Physician Assistant

## 2020-03-20 ENCOUNTER — Encounter: Payer: Self-pay | Admitting: Physician Assistant

## 2020-03-20 ENCOUNTER — Other Ambulatory Visit: Payer: Self-pay

## 2020-03-20 VITALS — BP 144/82 | HR 101 | Ht 62.0 in | Wt 147.6 lb

## 2020-03-20 DIAGNOSIS — R000 Tachycardia, unspecified: Secondary | ICD-10-CM | POA: Diagnosis not present

## 2020-03-20 DIAGNOSIS — I10 Essential (primary) hypertension: Secondary | ICD-10-CM

## 2020-03-20 DIAGNOSIS — E785 Hyperlipidemia, unspecified: Secondary | ICD-10-CM

## 2020-03-20 NOTE — Patient Instructions (Signed)

## 2020-04-06 DIAGNOSIS — G4700 Insomnia, unspecified: Secondary | ICD-10-CM | POA: Diagnosis not present

## 2020-04-06 DIAGNOSIS — E785 Hyperlipidemia, unspecified: Secondary | ICD-10-CM | POA: Diagnosis not present

## 2020-04-06 DIAGNOSIS — I10 Essential (primary) hypertension: Secondary | ICD-10-CM | POA: Diagnosis not present

## 2020-04-09 ENCOUNTER — Other Ambulatory Visit: Payer: Self-pay | Admitting: Student

## 2020-04-10 DIAGNOSIS — M545 Low back pain, unspecified: Secondary | ICD-10-CM | POA: Diagnosis not present

## 2020-04-10 DIAGNOSIS — M9903 Segmental and somatic dysfunction of lumbar region: Secondary | ICD-10-CM | POA: Diagnosis not present

## 2020-04-10 DIAGNOSIS — M5021 Other cervical disc displacement,  high cervical region: Secondary | ICD-10-CM | POA: Diagnosis not present

## 2020-04-10 DIAGNOSIS — M9902 Segmental and somatic dysfunction of thoracic region: Secondary | ICD-10-CM | POA: Diagnosis not present

## 2020-04-10 DIAGNOSIS — M542 Cervicalgia: Secondary | ICD-10-CM | POA: Diagnosis not present

## 2020-04-10 DIAGNOSIS — M9901 Segmental and somatic dysfunction of cervical region: Secondary | ICD-10-CM | POA: Diagnosis not present

## 2020-04-13 ENCOUNTER — Other Ambulatory Visit: Payer: Self-pay

## 2020-04-13 MED ORDER — DILTIAZEM HCL ER COATED BEADS 240 MG PO CP24: 240.0000 mg | ORAL_CAPSULE | Freq: Every day | ORAL | 3 refills | Status: DC

## 2020-04-13 NOTE — Telephone Encounter (Signed)
Pt's medication was sent to pt's pharmacy as requested. Confirmation received.  °

## 2020-04-17 DIAGNOSIS — M545 Low back pain, unspecified: Secondary | ICD-10-CM | POA: Diagnosis not present

## 2020-04-17 DIAGNOSIS — M5021 Other cervical disc displacement,  high cervical region: Secondary | ICD-10-CM | POA: Diagnosis not present

## 2020-04-17 DIAGNOSIS — M9903 Segmental and somatic dysfunction of lumbar region: Secondary | ICD-10-CM | POA: Diagnosis not present

## 2020-04-17 DIAGNOSIS — M9901 Segmental and somatic dysfunction of cervical region: Secondary | ICD-10-CM | POA: Diagnosis not present

## 2020-04-17 DIAGNOSIS — M542 Cervicalgia: Secondary | ICD-10-CM | POA: Diagnosis not present

## 2020-04-17 DIAGNOSIS — M9902 Segmental and somatic dysfunction of thoracic region: Secondary | ICD-10-CM | POA: Diagnosis not present

## 2020-04-18 DIAGNOSIS — M5021 Other cervical disc displacement,  high cervical region: Secondary | ICD-10-CM | POA: Diagnosis not present

## 2020-04-18 DIAGNOSIS — M9901 Segmental and somatic dysfunction of cervical region: Secondary | ICD-10-CM | POA: Diagnosis not present

## 2020-04-18 DIAGNOSIS — M545 Low back pain, unspecified: Secondary | ICD-10-CM | POA: Diagnosis not present

## 2020-04-18 DIAGNOSIS — M542 Cervicalgia: Secondary | ICD-10-CM | POA: Diagnosis not present

## 2020-04-18 DIAGNOSIS — M9902 Segmental and somatic dysfunction of thoracic region: Secondary | ICD-10-CM | POA: Diagnosis not present

## 2020-04-18 DIAGNOSIS — M9903 Segmental and somatic dysfunction of lumbar region: Secondary | ICD-10-CM | POA: Diagnosis not present

## 2020-04-20 DIAGNOSIS — M545 Low back pain, unspecified: Secondary | ICD-10-CM | POA: Diagnosis not present

## 2020-04-20 DIAGNOSIS — M9902 Segmental and somatic dysfunction of thoracic region: Secondary | ICD-10-CM | POA: Diagnosis not present

## 2020-04-20 DIAGNOSIS — M5021 Other cervical disc displacement,  high cervical region: Secondary | ICD-10-CM | POA: Diagnosis not present

## 2020-04-20 DIAGNOSIS — M542 Cervicalgia: Secondary | ICD-10-CM | POA: Diagnosis not present

## 2020-04-20 DIAGNOSIS — M9903 Segmental and somatic dysfunction of lumbar region: Secondary | ICD-10-CM | POA: Diagnosis not present

## 2020-04-20 DIAGNOSIS — M9901 Segmental and somatic dysfunction of cervical region: Secondary | ICD-10-CM | POA: Diagnosis not present

## 2020-04-24 DIAGNOSIS — M9903 Segmental and somatic dysfunction of lumbar region: Secondary | ICD-10-CM | POA: Diagnosis not present

## 2020-04-24 DIAGNOSIS — M542 Cervicalgia: Secondary | ICD-10-CM | POA: Diagnosis not present

## 2020-04-24 DIAGNOSIS — M5021 Other cervical disc displacement,  high cervical region: Secondary | ICD-10-CM | POA: Diagnosis not present

## 2020-04-24 DIAGNOSIS — M9902 Segmental and somatic dysfunction of thoracic region: Secondary | ICD-10-CM | POA: Diagnosis not present

## 2020-04-24 DIAGNOSIS — M545 Low back pain, unspecified: Secondary | ICD-10-CM | POA: Diagnosis not present

## 2020-04-24 DIAGNOSIS — M9901 Segmental and somatic dysfunction of cervical region: Secondary | ICD-10-CM | POA: Diagnosis not present

## 2020-04-25 DIAGNOSIS — M9903 Segmental and somatic dysfunction of lumbar region: Secondary | ICD-10-CM | POA: Diagnosis not present

## 2020-04-25 DIAGNOSIS — M9901 Segmental and somatic dysfunction of cervical region: Secondary | ICD-10-CM | POA: Diagnosis not present

## 2020-04-25 DIAGNOSIS — M545 Low back pain, unspecified: Secondary | ICD-10-CM | POA: Diagnosis not present

## 2020-04-25 DIAGNOSIS — M542 Cervicalgia: Secondary | ICD-10-CM | POA: Diagnosis not present

## 2020-04-25 DIAGNOSIS — M5021 Other cervical disc displacement,  high cervical region: Secondary | ICD-10-CM | POA: Diagnosis not present

## 2020-04-25 DIAGNOSIS — M9902 Segmental and somatic dysfunction of thoracic region: Secondary | ICD-10-CM | POA: Diagnosis not present

## 2020-04-26 DIAGNOSIS — M9902 Segmental and somatic dysfunction of thoracic region: Secondary | ICD-10-CM | POA: Diagnosis not present

## 2020-04-26 DIAGNOSIS — M9901 Segmental and somatic dysfunction of cervical region: Secondary | ICD-10-CM | POA: Diagnosis not present

## 2020-04-26 DIAGNOSIS — M542 Cervicalgia: Secondary | ICD-10-CM | POA: Diagnosis not present

## 2020-04-26 DIAGNOSIS — M9903 Segmental and somatic dysfunction of lumbar region: Secondary | ICD-10-CM | POA: Diagnosis not present

## 2020-04-26 DIAGNOSIS — M545 Low back pain, unspecified: Secondary | ICD-10-CM | POA: Diagnosis not present

## 2020-04-26 DIAGNOSIS — M5021 Other cervical disc displacement,  high cervical region: Secondary | ICD-10-CM | POA: Diagnosis not present

## 2020-05-02 DIAGNOSIS — M9901 Segmental and somatic dysfunction of cervical region: Secondary | ICD-10-CM | POA: Diagnosis not present

## 2020-05-02 DIAGNOSIS — M545 Low back pain, unspecified: Secondary | ICD-10-CM | POA: Diagnosis not present

## 2020-05-02 DIAGNOSIS — M542 Cervicalgia: Secondary | ICD-10-CM | POA: Diagnosis not present

## 2020-05-02 DIAGNOSIS — M5021 Other cervical disc displacement,  high cervical region: Secondary | ICD-10-CM | POA: Diagnosis not present

## 2020-05-02 DIAGNOSIS — M9903 Segmental and somatic dysfunction of lumbar region: Secondary | ICD-10-CM | POA: Diagnosis not present

## 2020-05-02 DIAGNOSIS — M9902 Segmental and somatic dysfunction of thoracic region: Secondary | ICD-10-CM | POA: Diagnosis not present

## 2020-05-03 DIAGNOSIS — M542 Cervicalgia: Secondary | ICD-10-CM | POA: Diagnosis not present

## 2020-05-03 DIAGNOSIS — M5021 Other cervical disc displacement,  high cervical region: Secondary | ICD-10-CM | POA: Diagnosis not present

## 2020-05-03 DIAGNOSIS — M9901 Segmental and somatic dysfunction of cervical region: Secondary | ICD-10-CM | POA: Diagnosis not present

## 2020-05-03 DIAGNOSIS — M545 Low back pain, unspecified: Secondary | ICD-10-CM | POA: Diagnosis not present

## 2020-05-03 DIAGNOSIS — M9903 Segmental and somatic dysfunction of lumbar region: Secondary | ICD-10-CM | POA: Diagnosis not present

## 2020-05-03 DIAGNOSIS — M9902 Segmental and somatic dysfunction of thoracic region: Secondary | ICD-10-CM | POA: Diagnosis not present

## 2020-05-04 DIAGNOSIS — M542 Cervicalgia: Secondary | ICD-10-CM | POA: Diagnosis not present

## 2020-05-04 DIAGNOSIS — M9902 Segmental and somatic dysfunction of thoracic region: Secondary | ICD-10-CM | POA: Diagnosis not present

## 2020-05-04 DIAGNOSIS — M545 Low back pain, unspecified: Secondary | ICD-10-CM | POA: Diagnosis not present

## 2020-05-04 DIAGNOSIS — M5021 Other cervical disc displacement,  high cervical region: Secondary | ICD-10-CM | POA: Diagnosis not present

## 2020-05-04 DIAGNOSIS — M9901 Segmental and somatic dysfunction of cervical region: Secondary | ICD-10-CM | POA: Diagnosis not present

## 2020-05-04 DIAGNOSIS — M9903 Segmental and somatic dysfunction of lumbar region: Secondary | ICD-10-CM | POA: Diagnosis not present

## 2020-05-08 DIAGNOSIS — M9902 Segmental and somatic dysfunction of thoracic region: Secondary | ICD-10-CM | POA: Diagnosis not present

## 2020-05-08 DIAGNOSIS — M5021 Other cervical disc displacement,  high cervical region: Secondary | ICD-10-CM | POA: Diagnosis not present

## 2020-05-08 DIAGNOSIS — M545 Low back pain, unspecified: Secondary | ICD-10-CM | POA: Diagnosis not present

## 2020-05-08 DIAGNOSIS — M9901 Segmental and somatic dysfunction of cervical region: Secondary | ICD-10-CM | POA: Diagnosis not present

## 2020-05-08 DIAGNOSIS — M9903 Segmental and somatic dysfunction of lumbar region: Secondary | ICD-10-CM | POA: Diagnosis not present

## 2020-05-08 DIAGNOSIS — M542 Cervicalgia: Secondary | ICD-10-CM | POA: Diagnosis not present

## 2020-05-09 DIAGNOSIS — M9901 Segmental and somatic dysfunction of cervical region: Secondary | ICD-10-CM | POA: Diagnosis not present

## 2020-05-09 DIAGNOSIS — M9902 Segmental and somatic dysfunction of thoracic region: Secondary | ICD-10-CM | POA: Diagnosis not present

## 2020-05-09 DIAGNOSIS — M545 Low back pain, unspecified: Secondary | ICD-10-CM | POA: Diagnosis not present

## 2020-05-09 DIAGNOSIS — M5021 Other cervical disc displacement,  high cervical region: Secondary | ICD-10-CM | POA: Diagnosis not present

## 2020-05-09 DIAGNOSIS — M9903 Segmental and somatic dysfunction of lumbar region: Secondary | ICD-10-CM | POA: Diagnosis not present

## 2020-05-09 DIAGNOSIS — M542 Cervicalgia: Secondary | ICD-10-CM | POA: Diagnosis not present

## 2020-05-11 DIAGNOSIS — M5021 Other cervical disc displacement,  high cervical region: Secondary | ICD-10-CM | POA: Diagnosis not present

## 2020-05-11 DIAGNOSIS — M9902 Segmental and somatic dysfunction of thoracic region: Secondary | ICD-10-CM | POA: Diagnosis not present

## 2020-05-11 DIAGNOSIS — M545 Low back pain, unspecified: Secondary | ICD-10-CM | POA: Diagnosis not present

## 2020-05-11 DIAGNOSIS — M542 Cervicalgia: Secondary | ICD-10-CM | POA: Diagnosis not present

## 2020-05-11 DIAGNOSIS — M9903 Segmental and somatic dysfunction of lumbar region: Secondary | ICD-10-CM | POA: Diagnosis not present

## 2020-05-11 DIAGNOSIS — M9901 Segmental and somatic dysfunction of cervical region: Secondary | ICD-10-CM | POA: Diagnosis not present

## 2020-05-16 DIAGNOSIS — M9902 Segmental and somatic dysfunction of thoracic region: Secondary | ICD-10-CM | POA: Diagnosis not present

## 2020-05-16 DIAGNOSIS — M5021 Other cervical disc displacement,  high cervical region: Secondary | ICD-10-CM | POA: Diagnosis not present

## 2020-05-16 DIAGNOSIS — M9901 Segmental and somatic dysfunction of cervical region: Secondary | ICD-10-CM | POA: Diagnosis not present

## 2020-05-16 DIAGNOSIS — M9903 Segmental and somatic dysfunction of lumbar region: Secondary | ICD-10-CM | POA: Diagnosis not present

## 2020-05-16 DIAGNOSIS — M545 Low back pain, unspecified: Secondary | ICD-10-CM | POA: Diagnosis not present

## 2020-05-16 DIAGNOSIS — M542 Cervicalgia: Secondary | ICD-10-CM | POA: Diagnosis not present

## 2020-05-18 DIAGNOSIS — M5021 Other cervical disc displacement,  high cervical region: Secondary | ICD-10-CM | POA: Diagnosis not present

## 2020-05-18 DIAGNOSIS — M9901 Segmental and somatic dysfunction of cervical region: Secondary | ICD-10-CM | POA: Diagnosis not present

## 2020-05-18 DIAGNOSIS — M545 Low back pain, unspecified: Secondary | ICD-10-CM | POA: Diagnosis not present

## 2020-05-18 DIAGNOSIS — M9902 Segmental and somatic dysfunction of thoracic region: Secondary | ICD-10-CM | POA: Diagnosis not present

## 2020-05-18 DIAGNOSIS — M9903 Segmental and somatic dysfunction of lumbar region: Secondary | ICD-10-CM | POA: Diagnosis not present

## 2020-05-18 DIAGNOSIS — M542 Cervicalgia: Secondary | ICD-10-CM | POA: Diagnosis not present

## 2020-05-30 DIAGNOSIS — M9902 Segmental and somatic dysfunction of thoracic region: Secondary | ICD-10-CM | POA: Diagnosis not present

## 2020-05-30 DIAGNOSIS — M545 Low back pain, unspecified: Secondary | ICD-10-CM | POA: Diagnosis not present

## 2020-05-30 DIAGNOSIS — M5021 Other cervical disc displacement,  high cervical region: Secondary | ICD-10-CM | POA: Diagnosis not present

## 2020-05-30 DIAGNOSIS — M9901 Segmental and somatic dysfunction of cervical region: Secondary | ICD-10-CM | POA: Diagnosis not present

## 2020-05-30 DIAGNOSIS — M9903 Segmental and somatic dysfunction of lumbar region: Secondary | ICD-10-CM | POA: Diagnosis not present

## 2020-05-30 DIAGNOSIS — M542 Cervicalgia: Secondary | ICD-10-CM | POA: Diagnosis not present

## 2020-06-01 DIAGNOSIS — M9903 Segmental and somatic dysfunction of lumbar region: Secondary | ICD-10-CM | POA: Diagnosis not present

## 2020-06-01 DIAGNOSIS — M545 Low back pain, unspecified: Secondary | ICD-10-CM | POA: Diagnosis not present

## 2020-06-01 DIAGNOSIS — M9902 Segmental and somatic dysfunction of thoracic region: Secondary | ICD-10-CM | POA: Diagnosis not present

## 2020-06-01 DIAGNOSIS — M5021 Other cervical disc displacement,  high cervical region: Secondary | ICD-10-CM | POA: Diagnosis not present

## 2020-06-01 DIAGNOSIS — M542 Cervicalgia: Secondary | ICD-10-CM | POA: Diagnosis not present

## 2020-06-01 DIAGNOSIS — M9901 Segmental and somatic dysfunction of cervical region: Secondary | ICD-10-CM | POA: Diagnosis not present

## 2020-06-06 DIAGNOSIS — M9903 Segmental and somatic dysfunction of lumbar region: Secondary | ICD-10-CM | POA: Diagnosis not present

## 2020-06-06 DIAGNOSIS — M9901 Segmental and somatic dysfunction of cervical region: Secondary | ICD-10-CM | POA: Diagnosis not present

## 2020-06-06 DIAGNOSIS — M545 Low back pain, unspecified: Secondary | ICD-10-CM | POA: Diagnosis not present

## 2020-06-06 DIAGNOSIS — M5021 Other cervical disc displacement,  high cervical region: Secondary | ICD-10-CM | POA: Diagnosis not present

## 2020-06-06 DIAGNOSIS — M542 Cervicalgia: Secondary | ICD-10-CM | POA: Diagnosis not present

## 2020-06-06 DIAGNOSIS — M9902 Segmental and somatic dysfunction of thoracic region: Secondary | ICD-10-CM | POA: Diagnosis not present

## 2020-06-08 DIAGNOSIS — M9903 Segmental and somatic dysfunction of lumbar region: Secondary | ICD-10-CM | POA: Diagnosis not present

## 2020-06-08 DIAGNOSIS — M9901 Segmental and somatic dysfunction of cervical region: Secondary | ICD-10-CM | POA: Diagnosis not present

## 2020-06-08 DIAGNOSIS — M5021 Other cervical disc displacement,  high cervical region: Secondary | ICD-10-CM | POA: Diagnosis not present

## 2020-06-08 DIAGNOSIS — M545 Low back pain, unspecified: Secondary | ICD-10-CM | POA: Diagnosis not present

## 2020-06-08 DIAGNOSIS — M542 Cervicalgia: Secondary | ICD-10-CM | POA: Diagnosis not present

## 2020-06-08 DIAGNOSIS — M9902 Segmental and somatic dysfunction of thoracic region: Secondary | ICD-10-CM | POA: Diagnosis not present

## 2020-06-12 DIAGNOSIS — M5021 Other cervical disc displacement,  high cervical region: Secondary | ICD-10-CM | POA: Diagnosis not present

## 2020-06-12 DIAGNOSIS — M9901 Segmental and somatic dysfunction of cervical region: Secondary | ICD-10-CM | POA: Diagnosis not present

## 2020-06-12 DIAGNOSIS — M9902 Segmental and somatic dysfunction of thoracic region: Secondary | ICD-10-CM | POA: Diagnosis not present

## 2020-06-12 DIAGNOSIS — M542 Cervicalgia: Secondary | ICD-10-CM | POA: Diagnosis not present

## 2020-06-12 DIAGNOSIS — M9903 Segmental and somatic dysfunction of lumbar region: Secondary | ICD-10-CM | POA: Diagnosis not present

## 2020-06-12 DIAGNOSIS — M545 Low back pain, unspecified: Secondary | ICD-10-CM | POA: Diagnosis not present

## 2020-06-19 DIAGNOSIS — M545 Low back pain, unspecified: Secondary | ICD-10-CM | POA: Diagnosis not present

## 2020-06-19 DIAGNOSIS — M9901 Segmental and somatic dysfunction of cervical region: Secondary | ICD-10-CM | POA: Diagnosis not present

## 2020-06-19 DIAGNOSIS — M9902 Segmental and somatic dysfunction of thoracic region: Secondary | ICD-10-CM | POA: Diagnosis not present

## 2020-06-19 DIAGNOSIS — M9903 Segmental and somatic dysfunction of lumbar region: Secondary | ICD-10-CM | POA: Diagnosis not present

## 2020-06-19 DIAGNOSIS — M542 Cervicalgia: Secondary | ICD-10-CM | POA: Diagnosis not present

## 2020-06-19 DIAGNOSIS — M5021 Other cervical disc displacement,  high cervical region: Secondary | ICD-10-CM | POA: Diagnosis not present

## 2020-06-20 DIAGNOSIS — M542 Cervicalgia: Secondary | ICD-10-CM | POA: Diagnosis not present

## 2020-06-20 DIAGNOSIS — M9902 Segmental and somatic dysfunction of thoracic region: Secondary | ICD-10-CM | POA: Diagnosis not present

## 2020-06-20 DIAGNOSIS — M5021 Other cervical disc displacement,  high cervical region: Secondary | ICD-10-CM | POA: Diagnosis not present

## 2020-06-20 DIAGNOSIS — M545 Low back pain, unspecified: Secondary | ICD-10-CM | POA: Diagnosis not present

## 2020-06-20 DIAGNOSIS — M9901 Segmental and somatic dysfunction of cervical region: Secondary | ICD-10-CM | POA: Diagnosis not present

## 2020-06-20 DIAGNOSIS — M9903 Segmental and somatic dysfunction of lumbar region: Secondary | ICD-10-CM | POA: Diagnosis not present

## 2020-06-26 DIAGNOSIS — M9902 Segmental and somatic dysfunction of thoracic region: Secondary | ICD-10-CM | POA: Diagnosis not present

## 2020-06-26 DIAGNOSIS — M542 Cervicalgia: Secondary | ICD-10-CM | POA: Diagnosis not present

## 2020-06-26 DIAGNOSIS — M9903 Segmental and somatic dysfunction of lumbar region: Secondary | ICD-10-CM | POA: Diagnosis not present

## 2020-06-26 DIAGNOSIS — M9901 Segmental and somatic dysfunction of cervical region: Secondary | ICD-10-CM | POA: Diagnosis not present

## 2020-06-26 DIAGNOSIS — M545 Low back pain, unspecified: Secondary | ICD-10-CM | POA: Diagnosis not present

## 2020-06-26 DIAGNOSIS — M5021 Other cervical disc displacement,  high cervical region: Secondary | ICD-10-CM | POA: Diagnosis not present

## 2020-07-03 DIAGNOSIS — M542 Cervicalgia: Secondary | ICD-10-CM | POA: Diagnosis not present

## 2020-07-03 DIAGNOSIS — M9901 Segmental and somatic dysfunction of cervical region: Secondary | ICD-10-CM | POA: Diagnosis not present

## 2020-07-03 DIAGNOSIS — M545 Low back pain, unspecified: Secondary | ICD-10-CM | POA: Diagnosis not present

## 2020-07-03 DIAGNOSIS — M5021 Other cervical disc displacement,  high cervical region: Secondary | ICD-10-CM | POA: Diagnosis not present

## 2020-07-03 DIAGNOSIS — M9903 Segmental and somatic dysfunction of lumbar region: Secondary | ICD-10-CM | POA: Diagnosis not present

## 2020-07-03 DIAGNOSIS — M9902 Segmental and somatic dysfunction of thoracic region: Secondary | ICD-10-CM | POA: Diagnosis not present

## 2020-07-05 ENCOUNTER — Other Ambulatory Visit: Payer: Self-pay | Admitting: Cardiology

## 2020-09-25 ENCOUNTER — Telehealth: Payer: Self-pay | Admitting: Cardiology

## 2020-09-25 NOTE — Telephone Encounter (Signed)
Pt c/o BP issue: STAT if pt c/o blurred vision, one-sided weakness or slurred speech  1. What are your last 5 BP readings? 127/70 , last couple days 175/80 HR 100 or above  2. Are you having any other symptoms (ex. Dizziness, headache, blurred vision, passed out)? Dizziness, has not passed out  3. What is your BP issue? Patient states the last couple days her BP and HR has been elevated. She states she has also felt dizzy, but does not feel dizzy now. She states she has not passed out. She states she thinks due to her BP her medications may need to be adjusted.

## 2020-09-25 NOTE — Telephone Encounter (Signed)
Pt reports elevated BP for a couple of days now. Explains that mornings are perfect but as the day goes on her BP creeps up. Elevating into 180s/70-80s by afternoon/evening. Pulse is running close to 100 or greater, this morning it was 113. HRs jump anytime she gets up to do something She experiences dizziness w/ activity on occasion, but no other symptoms. Pt aware that typically Dr. Elberta Fortis is not following BP but there are exceptions to this rule.  Pt states that her PCP Laureen Ochs, NP) thought Dr. Elberta Fortis would be the one who would take over BP monitoring/advising all together.  Informed that I would discuss this w/ Dr. Elberta Fortis and let her know today.  Will address scheduling pt for follow vs monitor, for reported tachycardia, when I call her back w/ MD recommendation.

## 2020-09-26 NOTE — Telephone Encounter (Signed)
Left detailed message, dpr on file. Apologized first for not getting back w/ her yesterday. Advised that she should follow up w/ PCP to discuss BP and get future/further advisement, per Dr. Elberta Fortis. Asked pt to call back if she would prefer to discuss this further...Marland KitchenMarland Kitchen

## 2020-09-28 DIAGNOSIS — Z5320 Procedure and treatment not carried out because of patient's decision for unspecified reasons: Secondary | ICD-10-CM | POA: Diagnosis not present

## 2020-09-28 DIAGNOSIS — G4700 Insomnia, unspecified: Secondary | ICD-10-CM | POA: Diagnosis not present

## 2020-09-28 DIAGNOSIS — I10 Essential (primary) hypertension: Secondary | ICD-10-CM | POA: Diagnosis not present

## 2020-09-28 DIAGNOSIS — E785 Hyperlipidemia, unspecified: Secondary | ICD-10-CM | POA: Diagnosis not present

## 2020-12-26 DIAGNOSIS — Z2821 Immunization not carried out because of patient refusal: Secondary | ICD-10-CM | POA: Diagnosis not present

## 2020-12-26 DIAGNOSIS — Z0000 Encounter for general adult medical examination without abnormal findings: Secondary | ICD-10-CM | POA: Diagnosis not present

## 2020-12-26 DIAGNOSIS — I10 Essential (primary) hypertension: Secondary | ICD-10-CM | POA: Diagnosis not present

## 2020-12-26 DIAGNOSIS — Z5320 Procedure and treatment not carried out because of patient's decision for unspecified reasons: Secondary | ICD-10-CM | POA: Diagnosis not present

## 2020-12-26 DIAGNOSIS — E871 Hypo-osmolality and hyponatremia: Secondary | ICD-10-CM | POA: Diagnosis not present

## 2020-12-26 DIAGNOSIS — E785 Hyperlipidemia, unspecified: Secondary | ICD-10-CM | POA: Diagnosis not present

## 2021-01-12 DIAGNOSIS — E871 Hypo-osmolality and hyponatremia: Secondary | ICD-10-CM | POA: Diagnosis not present

## 2021-03-27 ENCOUNTER — Other Ambulatory Visit: Payer: Self-pay | Admitting: Cardiology

## 2021-04-03 ENCOUNTER — Other Ambulatory Visit: Payer: Self-pay | Admitting: Cardiology

## 2021-04-03 DIAGNOSIS — R42 Dizziness and giddiness: Secondary | ICD-10-CM | POA: Diagnosis not present

## 2021-04-03 DIAGNOSIS — G4700 Insomnia, unspecified: Secondary | ICD-10-CM | POA: Diagnosis not present

## 2021-04-03 DIAGNOSIS — I10 Essential (primary) hypertension: Secondary | ICD-10-CM | POA: Diagnosis not present

## 2021-04-19 ENCOUNTER — Other Ambulatory Visit: Payer: Self-pay | Admitting: Cardiology

## 2021-04-30 ENCOUNTER — Other Ambulatory Visit: Payer: Self-pay | Admitting: Cardiology

## 2021-05-24 ENCOUNTER — Other Ambulatory Visit: Payer: Self-pay | Admitting: Cardiology

## 2021-11-13 ENCOUNTER — Telehealth: Payer: Self-pay | Admitting: Cardiology

## 2021-11-13 NOTE — Telephone Encounter (Signed)
  Per MyChart Scheduling message:  Pt c/o BP issue: STAT if pt c/o blurred vision, one-sided weakness or slurred speech  1. What are your last 5 BP readings?   2. Are you having any other symptoms (ex. Dizziness, headache, blurred vision, passed out)?   3. What is your BP issue?   147/77 150/84 140/86 156/91 15/88

## 2021-11-13 NOTE — Telephone Encounter (Signed)
Pt reports that she is seeing her PCP Thursday to discuss this more (advised that typically EP does not follow BP).   She wanted to make sure we were aware of what is going on and she is not sure if PCP will want cardiology to advise on this. States that usually her BPs are good/normal/stable but couple times per year it will "act up".  We discussed a plan, pt will see PCP Thursday and if they feel cardiology needs to advise further she will let us know and will then discuss w/ Dr. Elberta Fortis if needed. Patient verbalized understanding and agreeable to plan.   She is wanting an overdue follow up made.  Informed that I would send to EP scheduler to arrange OV with EP APP (pt states she was trying to make an appt with Francis Dowse, PA).

## 2021-11-27 ENCOUNTER — Other Ambulatory Visit: Payer: Self-pay | Admitting: Cardiology

## 2021-11-27 NOTE — Progress Notes (Signed)
Cardiology Office Note Date:  11/27/2021  Patient ID:  Kristin Schmidt, DOB 1955-08-19, MRN [id number] PCP:  Eather Colas, FNP  Cardiologist/Electrophysiologist: Dr. Elberta Fortis     Chief Complaint: annual visit, BP concerns  History of Present Illness: Kristin Schmidt is a 66 y.o. female with history of HTN, atherosclerosis (underwent cardiac cath in [place] in 2007 and had renal artery stent placed, as well as a stent to her left subclavian), sinus vs atrial tachycardia. 2017 presented to the ER with CP, underwent negative stress test.  She quit smoking 2016 though still wears nicotine patches, mentions everyone at work smokes and is still hard for her  She saw Dr. Elberta Fortis,  seen by him Nov 2019, she was doing well, no symptoms of her tachycardia, BP controlled, no changes were made, following herlipids with PMD.  Saw A. Coralee North, [place] Nov 2020, she arrived in [place] 129bpm, the patient reporting better at home, her dilt increased to 240mg  daily and planned for echo, labs She was encouraged to get a wearable HR tracker Labs looked OK, TSH was wnl Echo noted preserved LVEF, no significant VHD  I saw her Nov 2021 She is doing well. No CP, palpitations or cardiac awareness She denies any dizzy spells, no near syncope or syncope. She exercises sounds mostly chair exercises and stretching denies any difficulty with her ADLs No SOB or DOE Her primary started her this year on lipitor, but she developed aching muscles especially in her back that resolved when she took a week or so off it. She reported home HRs 80's-90's, urged to wean down her nicotine patches  This was her last office visit  Pt reached out recently about high BPs, planned to see her PMD and let us know if anything was needed after she saw her PMD, advised EP not typically mange BP   TODAY She feels well but has started to notice more often elevated BP, mostly associated with more stressful days but also noting  some lower numbers a swell,  High for her is towards 160's and low towards 110's No particular symptoms  She is not smoking Still uses high dose nicotine patch but not regularly, sometimes at work given coworkers smoke  No CP, palpitations No SOB No near syncope or syncope. No arm claudication   Past Medical History:  Diagnosis Date   Coronary artery disease 2007   renal artery stent and subclavian artery stent - [place]   Hypertension    Pneumonia    "walking pneumonia"   Renal disorder    Tachycardia    pt was treated with Carvedilol, taken off of it in May, 2018 due to feeling fatigued.    Past Surgical History:  Procedure Laterality Date   CESAREAN SECTION     FRACTURE SURGERY Left    leg   ORIF HUMERUS FRACTURE Left 02/20/2017   Procedure: OPEN REDUCTION INTERNAL FIXATION (ORIF) LEFT PROXIMAL HUMERUS FRACTURE;  Surgeon: Francena Hanly, MD;  Location: [facility] OR;  Service: Orthopedics;  Laterality: Left;   stent placement right kidney       Current Outpatient Medications  Medication Sig Dispense Refill   atorvastatin (LIPITOR) 20 MG tablet Take 20 mg by mouth daily.     diltiazem (CARDIZEM CD) 240 MG 24 hr capsule Take 1 capsule (240 mg total) by mouth daily. Please make overdue appt with Dr. Elberta Fortis before anymore refills. Thank you 1st attempt 30 capsule 0   hydrochlorothiazide (HYDRODIURIL) 25 MG tablet TAKE 1 TABLET BY MOUTH  EVERY DAY 90 tablet 1   losartan (COZAAR) 100 MG tablet Take 100 mg by mouth every evening.      Magnesium Oxide 250 MG TABS Take 250 mg by mouth daily.     melatonin 5 MG TABS Take 5 mg by mouth daily.     nicotine (NICODERM CQ - DOSED IN MG/24 HOURS) 21 mg/24hr patch Place 21 mg onto the skin daily as needed.     TURMERIC PO Take 1 tablet by mouth daily.     zolpidem (AMBIEN CR) 12.5 MG CR tablet Take 12.5 mg by mouth at bedtime as needed for sleep.     No current facility-administered medications for this visit.    Allergies:    Hydrocodone-acetaminophen   Social History:  The patient  reports that she quit smoking about 6 years ago. Her smoking use included cigarettes. She has never used smokeless tobacco. She reports current alcohol use. She reports that she does not use drugs.   Family History:  The patient's family history includes COPD in her father and mother; Cancer in her sister; Heart attack in her maternal grandfather.   ROS:  Please see the history of present illness.    All other systems are reviewed and otherwise negative.   PHYSICAL EXAM:  VS:  There were no vitals taken for this visit. BMI: There is no height or weight on file to calculate BMI. Well nourished, well developed, in no acute distress HEENT: normocephalic, atraumatic Neck: no JVD, carotid bruits or masses Cardiac:  RRR; no significant murmurs, no rubs, or gallops Lungs:  CTA b/l, no wheezing, rhonchi or rales Abd: soft, nontender MS: no deformity or atrophy Ext:  no edema Skin: warm and dry, no rash Neuro:  No gross deficits appreciated Psych: euthymic mood, full affect   EKG:  Done today and reviewed by myself shows  ST 111bpm  IMPRESSIONS   1. Left ventricular ejection fraction, by visual estimation, is 55 to  60%. The left ventricle has normal function. Left ventricular septal wall  thickness was mildly increased. There is no left ventricular hypertrophy.   2. The left ventricle has no regional wall motion abnormalities.   3. Global right ventricle has normal systolic function.The right  ventricular size is normal. No increase in right ventricular wall  thickness.   4. Left atrial size was normal.   5. Right atrial size was normal.   6. The mitral valve is normal in structure. Trivial mitral valve  regurgitation. No evidence of mitral stenosis.   7. The tricuspid valve is normal in structure. Tricuspid valve  regurgitation is not demonstrated.   8. The aortic valve is normal in structure. Aortic valve regurgitation is   not visualized. Mild to moderate aortic valve sclerosis/calcification  without any evidence of aortic stenosis.   9. Calcified left coronary cusp no AS.  10. The pulmonic valve was normal in structure. Pulmonic valve  regurgitation is not visualized.  11. The inferior vena cava is normal in size with greater than 50%  respiratory variability, suggesting right atrial pressure of 3 mmHg.   FINDINGS   Left Ventricle: Left ventricular ejection fraction, by visual estimation,  is 55 to 60%. The left ventricle has normal function. The left ventricle  has no regional wall motion abnormalities. There is no left ventricular  hypertrophy. Normal left atrial  pressure.    SPECT 02/28/16 The left ventricular ejection fraction is hyperdynamic (>65%). Nuclear stress EF: 77%. There was no ST segment deviation noted  during stress. The study is normal. This is a low risk study.    Recent Labs: No results found for requested labs within last 365 days.  No results found for requested labs within last 365 days.   CrCl cannot be calculated (Patient's most recent lab result is older than the maximum 21 days allowed.).   Wt Readings from Last 3 Encounters:  03/20/20 147 lb 9.6 oz (67 kg)  03/19/19 146 lb (66.2 kg)  03/31/18 146 lb (66.2 kg)     Other studies reviewed: Additional studies/records reviewed today include: summarized above  ASSESSMENT AND PLAN:  1. Sinus vs atrial tachycardia     Occasional use of nicotine patch only     Reports home HRs usually better  2. HTN     More labile readings at home,. She uses her L arm  Right: 148/80 Left 130/72  Get b/l UE arterial US and renal artery [place] as well given her history I have asked her to keep BP log, noting stress/ and or use of patch, triggers for her higher numbers She tries to avoid salt  BMET today  3. HLD     Labs monitored by her PMD  Disposition: F/u with her PMD for her lipids, she sees her soon, we will continue to  see her annually, sooner if needed  Current medicines are reviewed at length with the patient today.  The patient did not have any concerns regarding medicines.  Norma Fredrickson, PA-C 11/27/2021 7:10 AM     [facility] [address] [place] [address] [phone] (office)  [phone] (fax)

## 2021-11-28 ENCOUNTER — Encounter: Payer: Self-pay | Admitting: Physician Assistant

## 2021-11-28 ENCOUNTER — Ambulatory Visit: Payer: PPO | Admitting: Physician Assistant

## 2021-11-28 VITALS — BP 140/60 | HR 111 | Ht 62.0 in | Wt 149.6 lb

## 2021-11-28 DIAGNOSIS — I739 Peripheral vascular disease, unspecified: Secondary | ICD-10-CM

## 2021-11-28 DIAGNOSIS — I10 Essential (primary) hypertension: Secondary | ICD-10-CM

## 2021-11-28 DIAGNOSIS — E785 Hyperlipidemia, unspecified: Secondary | ICD-10-CM

## 2021-11-28 DIAGNOSIS — I471 Supraventricular tachycardia: Secondary | ICD-10-CM | POA: Diagnosis not present

## 2021-11-28 LAB — BASIC METABOLIC PANEL
BUN/Creatinine Ratio: 21 (ref 12–28)
BUN: 22 mg/dL (ref 8–27)
CO2: 22 mmol/L (ref 20–29)
Calcium: 10.5 mg/dL — ABNORMAL HIGH (ref 8.7–10.3)
Chloride: 92 mmol/L — ABNORMAL LOW (ref 96–106)
Creatinine, Ser: 1.04 mg/dL — ABNORMAL HIGH (ref 0.57–1.00)
Glucose: 98 mg/dL (ref 70–99)
Potassium: 5 mmol/L (ref 3.5–5.2)
Sodium: 131 mmol/L — ABNORMAL LOW (ref 134–144)
eGFR: 59 mL/min/{1.73_m2} — ABNORMAL LOW (ref >59)

## 2021-11-28 NOTE — Patient Instructions (Addendum)
Medication Instructions:  Your physician recommends that you continue on your current medications as directed. Please refer to the Current Medication list given to you today.  Labwork: You will have blood work drawn today:  BMET    Testing/Procedures:  PLEASE SCHEDULE AN ARTERIAL ULTRASOUND- BILATERAL UPPER EXTREMITIES   2.  PLEASE SCHEDULE A RENAL ULTRASOUND- BILATERAL ARTERIES;     Follow-Up: Your physician wants you to follow-up in: 4 WEEKS with Francis Dowse, PA-C  ( PLEASE BRING YOUR BP CUFF and LOG containing your BLOOD PRESSURE readings. )   Any Other Special Instructions Will Be Listed Below (If Applicable).  If you need a refill on your cardiac medications before your next appointment, please call your pharmacy.   Important Information About Sugar

## 2021-12-10 ENCOUNTER — Other Ambulatory Visit: Payer: Self-pay | Admitting: Physician Assistant

## 2021-12-10 DIAGNOSIS — E785 Hyperlipidemia, unspecified: Secondary | ICD-10-CM

## 2021-12-10 DIAGNOSIS — I739 Peripheral vascular disease, unspecified: Secondary | ICD-10-CM

## 2021-12-10 DIAGNOSIS — I771 Stricture of artery: Secondary | ICD-10-CM

## 2021-12-10 DIAGNOSIS — Z959 Presence of cardiac and vascular implant and graft, unspecified: Secondary | ICD-10-CM

## 2021-12-12 ENCOUNTER — Other Ambulatory Visit: Payer: Self-pay | Admitting: Physician Assistant

## 2021-12-12 DIAGNOSIS — E785 Hyperlipidemia, unspecified: Secondary | ICD-10-CM

## 2021-12-12 DIAGNOSIS — I771 Stricture of artery: Secondary | ICD-10-CM

## 2021-12-12 DIAGNOSIS — I739 Peripheral vascular disease, unspecified: Secondary | ICD-10-CM

## 2021-12-12 DIAGNOSIS — Z959 Presence of cardiac and vascular implant and graft, unspecified: Secondary | ICD-10-CM

## 2021-12-17 ENCOUNTER — Ambulatory Visit (HOSPITAL_BASED_OUTPATIENT_CLINIC_OR_DEPARTMENT_OTHER)
Admission: RE | Admit: 2021-12-17 | Discharge: 2021-12-17 | Disposition: A | Discharge status: Home / Self Care | Payer: PPO | Source: Ambulatory Visit | Attending: Physician Assistant | Admitting: Physician Assistant

## 2021-12-17 ENCOUNTER — Ambulatory Visit (HOSPITAL_COMMUNITY)
Admission: RE | Admit: 2021-12-17 | Discharge: 2021-12-17 | Disposition: A | Discharge status: Home / Self Care | Payer: PPO | Source: Ambulatory Visit | Attending: Physician Assistant | Admitting: Physician Assistant

## 2021-12-17 DIAGNOSIS — I471 Supraventricular tachycardia: Secondary | ICD-10-CM | POA: Insufficient documentation

## 2021-12-17 DIAGNOSIS — Z959 Presence of cardiac and vascular implant and graft, unspecified: Secondary | ICD-10-CM | POA: Diagnosis present

## 2021-12-17 DIAGNOSIS — E785 Hyperlipidemia, unspecified: Secondary | ICD-10-CM | POA: Insufficient documentation

## 2021-12-17 DIAGNOSIS — I739 Peripheral vascular disease, unspecified: Secondary | ICD-10-CM | POA: Diagnosis present

## 2021-12-17 DIAGNOSIS — I10 Essential (primary) hypertension: Secondary | ICD-10-CM | POA: Insufficient documentation

## 2021-12-17 DIAGNOSIS — I771 Stricture of artery: Secondary | ICD-10-CM | POA: Insufficient documentation

## 2021-12-27 NOTE — Progress Notes (Signed)
Cardiology Office Note Date:  12/27/2021  Patient ID:  Kristin Schmidt, DOB [birth year], MRN [id number] PCP:  Eather Colas, FNP  Cardiologist/Electrophysiologist: Dr. Elberta Fortis     Chief Complaint:  planned f/u  History of Present Illness: Kristin Schmidt is a 66 y.o. female with history of HTN, atherosclerosis (underwent cardiac cath in [place] in 2007 and had renal artery stent placed, as well as a stent to her left subclavian), sinus vs atrial tachycardia. 2017 presented to the ER with CP, underwent negative stress test.  She quit smoking 2016 though still wears nicotine patches, mentions everyone at work smokes and is still hard for her  She saw Dr. Elberta Fortis,  seen by him Nov 2019, she was doing well, no symptoms of her tachycardia, BP controlled, no changes were made, following herlipids with PMD.  Saw A. Coralee North, [place] Nov 2020, she arrived in [place] 129bpm, the patient reporting better at home, her dilt increased to 240mg  daily and planned for echo, labs She was encouraged to get a wearable HR tracker Labs looked OK, TSH was wnl Echo noted preserved LVEF, no significant VHD  I saw her Nov 2021 She is doing well. No CP, palpitations or cardiac awareness She denies any dizzy spells, no near syncope or syncope. She exercises sounds mostly chair exercises and stretching denies any difficulty with her ADLs No SOB or DOE Her primary started her this year on lipitor, but she developed aching muscles especially in her back that resolved when she took a week or so off it. She reported home HRs 80's-90's, urged to wean down her nicotine patches    Pt reached out recently about high BPs, planned to see her PMD and let Dec 2021 know if anything was needed after she saw her PMD, advised EP not typically mange BP  I saw her 11/28/21 She feels well but has started to notice more often elevated BP, mostly associated with more stressful days but also noting some lower numbers a swell,  High  for her is towards 160's and low towards 110's No particular symptoms She is not smoking Still uses high dose nicotine patch but not regularly, sometimes at work given coworkers smoke No CP, palpitations No SOB No near syncope or syncope. No arm claudication  EKG again noted ST 111bpm, reported home HRs typically 80's-90's.  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   Advised to come in to review for any symptoms.  I did d/w one of our cardiologits, without prandial symptoms, no further would be needed.  TODAY She feels well. No CP, palpitations or cardiac awareness No abdominal pain, postprandial discomfort of any kind. No SOB  She worries about her BP, though has noted that if she gets a higher reading she gets anxious and the BP just continues upwards. She does tend to be an anxious person, and knows that this contributes to her BP.  No near syncope or syncope.  Past Medical History:  Diagnosis Date   Coronary artery disease 2007   renal artery stent and subclavian artery stent - [place]   Hypertension    Pneumonia    "walking pneumonia"   Renal disorder    Tachycardia    pt was treated with Carvedilol, taken off of it  in May, 2018 due to feeling fatigued.    Past Surgical History:  Procedure Laterality Date   CESAREAN SECTION     FRACTURE SURGERY Left    leg   ORIF HUMERUS FRACTURE Left 02/20/2017   Procedure: OPEN REDUCTION INTERNAL FIXATION (ORIF) LEFT PROXIMAL HUMERUS FRACTURE;  Surgeon: Francena Hanly, MD;  Location: [facility] OR;  Service: Orthopedics;  Laterality: Left;   stent placement right kidney       Current Outpatient Medications  Medication Sig Dispense Refill   diltiazem (CARDIZEM  CD) 240 MG 24 hr capsule Take 1 capsule (240 mg total) by mouth daily. Please make overdue appt with Dr. Elberta Fortis before anymore refills. Thank you 1st attempt 30 capsule 0   losartan (COZAAR) 100 MG tablet Take 100 mg by mouth every evening.      Magnesium Oxide 250 MG TABS Take 250 mg by mouth daily.     melatonin 5 MG TABS Take 5 mg by mouth daily.     nicotine (NICODERM CQ - DOSED IN MG/24 HOURS) 21 mg/24hr patch Place 21 mg onto the skin daily as needed (for smoking).     TURMERIC PO Take 1 tablet by mouth daily.     zolpidem (AMBIEN) 5 MG tablet Take 5 mg by mouth at bedtime as needed for sleep.     No current facility-administered medications for this visit.    Allergies:   Hydrocodone-acetaminophen and Penicillins   Social History:  The patient  reports that she quit smoking about 6 years ago. Her smoking use included cigarettes. She has never used smokeless tobacco. She reports current alcohol use. She reports that she does not use drugs.   Family History:  The patient's family history includes COPD in her father and mother; Cancer in her sister; Heart attack in her maternal grandfather.   ROS:  Please see the history of present illness.    All other systems are reviewed and otherwise negative.   PHYSICAL EXAM:  VS:  There were no vitals taken for this visit. BMI: There is no height or weight on file to calculate BMI. Well nourished, well developed, in no acute distress HEENT: normocephalic, atraumatic Neck: no JVD, carotid bruits or masses Cardiac:   RRR; no significant murmurs, no rubs, or gallops Lungs:  CTA b/l, no wheezing, rhonchi or rales Abd: soft, nontender MS: no deformity or atrophy Ext:   no edema Skin: warm and dry, no rash Neuro:  No gross deficits appreciated Psych: euthymic mood, full affect   EKG:  not done today   12/17/21: b/l UE art [place] Summary:  Left: No obstruction visualized in the left upper extremity. The        left subclavian artery appears  patent, s/p history of stent        placement.  Summary:  Right: No significant arterial obstruction detected in the right         upper extremity.  Left: No significant arterial obstruction detected in the left upper        extremity.   12/17/21: Renal art US Renal:  Right: 1-59% stenosis of the right renal artery. RRV flow present.         Normal size right kidney. Abnormal right Resistive Index.         Normal cortical thickness of right kidney.  Left:  1-59% stenosis of the left renal artery. LRV flow present.         Normal size of left kidney. Abnormal left Resisitve Index.  Normal cortical thickness of the left kidney.  Mesenteric:  70 to 99% stenosis in the celiac artery and superior mesenteric artery.      04/16/2019: TTE IMPRESSIONS   1. Left ventricular ejection fraction, by visual estimation, is 55 to  60%. The left ventricle has normal function. Left ventricular septal wall  thickness was mildly increased. There is no left ventricular hypertrophy.   2. The left ventricle has no regional wall motion abnormalities.   3. Global right ventricle has normal systolic function.The right  ventricular size is normal. No increase in right ventricular wall  thickness.   4. Left atrial size was normal.   5. Right atrial size was normal.   6. The mitral valve is normal in structure. Trivial mitral valve  regurgitation. No evidence of mitral stenosis.   7. The tricuspid valve is normal in structure. Tricuspid valve  regurgitation is not demonstrated.   8. The aortic valve is normal in structure. Aortic valve regurgitation is  not visualized. Mild to moderate aortic valve sclerosis/calcification  without any evidence of aortic stenosis.   9. Calcified left coronary cusp no AS.  10. The pulmonic valve was normal in structure. Pulmonic valve  regurgitation is not visualized.  11. The inferior vena cava is normal in size with greater than 50%  respiratory variability,  suggesting right atrial pressure of 3 mmHg.   FINDINGS   Left Ventricle: Left ventricular ejection fraction, by visual estimation,  is 55 to 60%. The left ventricle has normal function. The left ventricle  has no regional wall motion abnormalities. There is no left ventricular  hypertrophy. Normal left atrial  pressure.    SPECT 02/28/16 The left ventricular ejection fraction is hyperdynamic (>65%). Nuclear stress EF: 77%. There was no ST segment deviation noted during stress. The study is normal. This is a low risk study.    Recent Labs: 11/28/2021: BUN 22; Creatinine, Ser 1.04; Potassium 5.0; Sodium 131  No results found for requested labs within last 365 days.   CrCl cannot be calculated (Patient's most recent lab result is older than the maximum 21 days allowed.).   Wt Readings from Last 3 Encounters:  11/28/21 149 lb 9.6 oz (67.9 kg)  03/20/20 147 lb 9.6 oz (67 kg)  03/19/19 146 lb (66.2 kg)     Other studies reviewed: Additional studies/records reviewed today include: summarized above  ASSESSMENT AND PLAN:  1. Sinus vs atrial tachycardia     Occasional use of nicotine patch only     Reports home HRs usually better  2. HTN She brings her readings, often just fine, 110's-120's/70's-80's  then will trend higher 150'2-160's as well for some days.  HERE: As soon as we are setting up to check her cuff, she mentions she is nervous, and looks it Manual by myself:  188/95 >> 190/88 Her cuff: hers : 200/100 >> 188/104  Her 1st initial rooming BP 160/78  Lopressor 25mg  BID PRN for SBP >160 I have advised she reach out to her PMD about her anxiety, discussed strategies for stress/anxiety management  3. PVD (prior left subclavian artery stent) Patent stent 4, renal stent Not mentioned in report, no obstructive disease described  Incidentally, + mesenteric/celiac artery disease     No symptoms  4. HLD     Labs monitored by her PMD  Disposition: F/u with her PMD  for her lipids, she sees her soon, we will continue to see her annually, sooner if needed  Current medicines are reviewed at  length with the patient today.  The patient did not have any concerns regarding medicines.  Norma Fredrickson, PA-C 12/27/2021 7:47 AM     [facility] [address] [place] [address] [phone] (office)  [phone] (fax)

## 2021-12-28 ENCOUNTER — Encounter: Payer: Self-pay | Admitting: Physician Assistant

## 2021-12-28 ENCOUNTER — Ambulatory Visit: Payer: PPO | Admitting: Physician Assistant

## 2021-12-28 VITALS — BP 160/78 | HR 100 | Ht 62.0 in | Wt 148.0 lb

## 2021-12-28 DIAGNOSIS — I10 Essential (primary) hypertension: Secondary | ICD-10-CM

## 2021-12-28 DIAGNOSIS — I739 Peripheral vascular disease, unspecified: Secondary | ICD-10-CM

## 2021-12-28 MED ORDER — METOPROLOL TARTRATE 25 MG PO TABS: 25.0000 mg | ORAL_TABLET | Freq: Two times a day (BID) | ORAL | 0 refills | Status: DC | PRN

## 2021-12-28 NOTE — Patient Instructions (Signed)
Medication Instructions:   START TAKING LOPRESSOR 25 MG TWICE A DAY SA NEEDED FOR BLOOD PRESSURE OVER 160   *If you need a refill on your cardiac medications before your next appointment, please call your pharmacy*   Lab Work: NONE ORDERED  TODAY   If you have labs (blood work) drawn today and your tests are completely normal, you will receive your results only by: MyChart Message (if you have MyChart) OR A paper copy in the mail If you have any lab test that is abnormal or we need to change your treatment, we will call you to review the results.   Testing/Procedures: NONE ORDERED  TODAY    Follow-Up: At [facility], you and your health needs are our priority.  As part of our continuing mission to provide you with exceptional heart care, we have created designated Provider Care Teams.  These Care Teams include your primary Cardiologist (physician) and Advanced Practice Providers (APPs -  Physician Assistants and Nurse Practitioners) who all work together to provide you with the care you need, when you need it.  We recommend signing up for the patient portal called "MyChart".  Sign up information is provided on this After Visit Summary.  MyChart is used to connect with patients for Virtual Visits (Telemedicine).  Patients are able to view lab/test results, encounter notes, upcoming appointments, etc.  Non-urgent messages can be sent to your provider as well.   To learn more about what you can do with MyChart, go to [email or web address].    Your next appointment:   3 month(s)  The format for your next appointment:   In Person  Provider:   Francis Dowse, PA-C    Other Instructions   Important Information About Sugar

## 2021-12-29 ENCOUNTER — Other Ambulatory Visit: Payer: Self-pay | Admitting: Cardiology

## 2022-02-03 ENCOUNTER — Other Ambulatory Visit: Payer: Self-pay | Admitting: Physician Assistant

## 2022-03-31 NOTE — Progress Notes (Deleted)
Cardiology Office Note Date:  03/31/2022  Patient ID:  Kristin Schmidt, DOB 10/01/1955, MRN [id number] PCP:  Eather ColasHunter, Megan A, FNP  Cardiologist/Electrophysiologist: Dr. Elberta Fortisamnitz     Chief Complaint:  *** 3 mo ? planned f/u  History of Present Illness: Kristin Schmidt is a 66 y.o. female with history of HTN, atherosclerosis (underwent cardiac cath in [place] in 2007 and had renal artery stent placed, as well as a stent to her left subclavian), sinus vs atrial tachycardia. 2017 presented to the ER with CP, underwent negative stress test.  She quit smoking 2016 though still wears nicotine patches, mentions everyone at work smokes and is still hard for her  She saw Dr. Elberta Fortisamnitz,  seen by him Nov 2019, she was doing well, no symptoms of her tachycardia, BP controlled, no changes were made, following herlipids with PMD.  Saw A. Coralee [place], [place] Nov 2020, she arrived in [place] 129bpm, the patient reporting better at home, her dilt increased to 240mg  daily and planned for echo, labs She was encouraged to get a wearable HR tracker Labs looked OK, TSH was wnl Echo noted preserved LVEF, no significant VHD  I saw her Nov 2021 She is doing well. No CP, palpitations or cardiac awareness She denies any dizzy spells, no near syncope or syncope. She exercises sounds mostly chair exercises and stretching denies any difficulty with her ADLs No SOB or DOE Her primary started her this year on lipitor, but she developed aching muscles especially in her back that resolved when she took a week or so off it. She reported home HRs 80's-90's, urged to wean down her nicotine patches    Pt reached out recently about high BPs, planned to see her PMD and let us know if anything was needed after she saw her PMD, advised EP not typically mange BP  I saw her 11/28/21 She feels well but has started to notice more often elevated BP, mostly associated with more stressful days but also noting some lower numbers a  swell,  High for her is towards 160's and low towards 110's No particular symptoms She is not smoking Still uses high dose nicotine patch but not regularly, sometimes at work given coworkers smoke No CP, palpitations No SOB No near syncope or syncope. No arm claudication  EKG again noted ST 111bpm, reported home HRs typically 80's-90's.  Mildly uneven BPs, given her vascular history planned for arterial studies, otherwise annual visit US upper extremities noted not arterial disease with patent stent Renal US also imaged mesenteric, renal arteries with no obstructive disease, did note 70 to 99% stenosis in the celiac artery and superior mesenteric artery.   Advised to come in to review for any symptoms.  I did d/w one of our cardiologits, without prandial symptoms, no further would be needed.  I saw her 12/28/21 She feels well. No CP, palpitations or cardiac awareness No abdominal pain, postprandial discomfort of any kind. No SOB She worries about her BP, though has noted that if she gets a higher reading she gets anxious and the BP just continues upwards. She does tend to be an anxious person, and knows that this contributes to her BP. No near syncope or syncope. Home BP readings largely OK< in office much higher, her hme cuff confirmed to be close to same as mu manual readings. Given a PRN lopressor for SBP > 160  *** ? 40mo *** home BPs *** lipids? *** HRs?  Past Medical History:  Diagnosis Date   Coronary  artery disease 2007   renal artery stent and subclavian artery stent - [place]   Hypertension    Pneumonia    "walking pneumonia"   Renal disorder    Tachycardia    pt was treated with Carvedilol, taken off of it in May, 2018 due to feeling fatigued.    Past Surgical History:  Procedure Laterality Date   CESAREAN SECTION     FRACTURE SURGERY Left    leg   ORIF HUMERUS FRACTURE Left 02/20/2017   Procedure: OPEN REDUCTION INTERNAL FIXATION (ORIF) LEFT PROXIMAL HUMERUS  FRACTURE;  Surgeon: Francena Hanly, MD;  Location: [facility] OR;  Service: Orthopedics;  Laterality: Left;   stent placement right kidney       Current Outpatient Medications  Medication Sig Dispense Refill   metoprolol tartrate (LOPRESSOR) 25 MG tablet TAKE 1 TABLET BY MOUTH 2 (TWO) TIMES DAILY AS NEEDED (IF TOP BLOOD PRESSURE IS OVER 160). 90 tablet 1   diltiazem (CARDIZEM CD) 240 MG 24 hr capsule Take 1 capsule (240 mg total) by mouth daily. Please make overdue appt with Dr. Elberta Fortis before anymore refills. Thank you 1st attempt 30 capsule 0   losartan (COZAAR) 100 MG tablet Take 100 mg by mouth every evening.      Magnesium Oxide 250 MG TABS Take 250 mg by mouth daily.     melatonin 5 MG TABS Take 5 mg by mouth daily.     nicotine (NICODERM CQ - DOSED IN MG/24 HOURS) 21 mg/24hr patch Place 21 mg onto the skin daily as needed (for smoking).     TURMERIC PO Take 1 tablet by mouth daily.     zolpidem (AMBIEN) 5 MG tablet Take 5 mg by mouth at bedtime as needed for sleep.     No current facility-administered medications for this visit.    Allergies:   Penicillins   Social History:  The patient  reports that she quit smoking about 6 years ago. Her smoking use included cigarettes. She has never used smokeless tobacco. She reports current alcohol use. She reports that she does not use drugs.   Family History:  The patient's family history includes COPD in her father and mother; Cancer in her sister; Heart attack in her maternal grandfather.   ROS:  Please see the history of present illness.    All other systems are reviewed and otherwise negative.   PHYSICAL EXAM:  VS:  There were no vitals taken for this visit. BMI: There is no height or weight on file to calculate BMI. Well nourished, well developed, in no acute distress HEENT: normocephalic, atraumatic Neck: no JVD, carotid bruits or masses Cardiac:   *** RRR; no significant murmurs, no rubs, or gallops Lungs:  *** CTA b/l, no wheezing,  rhonchi or rales Abd: soft, nontender MS: no deformity or atrophy Ext: ***  no edema Skin: warm and dry, no rash Neuro:  No gross deficits appreciated Psych: euthymic mood, full affect   EKG:  not done today   12/17/21: b/l UE art [place] Summary:  Left: No obstruction visualized in the left upper extremity. The        left subclavian artery appears patent, s/p history of stent        placement.  Summary:  Right: No significant arterial obstruction detected in the right         upper extremity.  Left: No significant arterial obstruction detected in the left upper        extremity.   12/17/21: Renal  art US Renal:  Right: 1-59% stenosis of the right renal artery. RRV flow present.         Normal size right kidney. Abnormal right Resistive Index.         Normal cortical thickness of right kidney.  Left:  1-59% stenosis of the left renal artery. LRV flow present.         Normal size of left kidney. Abnormal left Resisitve Index.         Normal cortical thickness of the left kidney.  Mesenteric:  70 to 99% stenosis in the celiac artery and superior mesenteric artery.      04/16/2019: TTE IMPRESSIONS   1. Left ventricular ejection fraction, by visual estimation, is 55 to  60%. The left ventricle has normal function. Left ventricular septal wall  thickness was mildly increased. There is no left ventricular hypertrophy.   2. The left ventricle has no regional wall motion abnormalities.   3. Global right ventricle has normal systolic function.The right  ventricular size is normal. No increase in right ventricular wall  thickness.   4. Left atrial size was normal.   5. Right atrial size was normal.   6. The mitral valve is normal in structure. Trivial mitral valve  regurgitation. No evidence of mitral stenosis.   7. The tricuspid valve is normal in structure. Tricuspid valve  regurgitation is not demonstrated.   8. The aortic valve is normal in structure. Aortic valve regurgitation  is  not visualized. Mild to moderate aortic valve sclerosis/calcification  without any evidence of aortic stenosis.   9. Calcified left coronary cusp no AS.  10. The pulmonic valve was normal in structure. Pulmonic valve  regurgitation is not visualized.  11. The inferior vena cava is normal in size with greater than 50%  respiratory variability, suggesting right atrial pressure of 3 mmHg.   FINDINGS   Left Ventricle: Left ventricular ejection fraction, by visual estimation,  is 55 to 60%. The left ventricle has normal function. The left ventricle  has no regional wall motion abnormalities. There is no left ventricular  hypertrophy. Normal left atrial  pressure.    SPECT 02/28/16 The left ventricular ejection fraction is hyperdynamic (>65%). Nuclear stress EF: 77%. There was no ST segment deviation noted during stress. The study is normal. This is a low risk study.    Recent Labs: 11/28/2021: BUN 22; Creatinine, Ser 1.04; Potassium 5.0; Sodium 131  No results found for requested labs within last 365 days.   CrCl cannot be calculated (Patient's most recent lab result is older than the maximum 21 days allowed.).   Wt Readings from Last 3 Encounters:  12/28/21 148 lb (67.1 kg)  11/28/21 149 lb 9.6 oz (67.9 kg)  03/20/20 147 lb 9.6 oz (67 kg)     Other studies reviewed: Additional studies/records reviewed today include: summarized above  ASSESSMENT AND PLAN:  1. Sinus vs atrial tachycardia     Occasional use of nicotine patch only     *** Reports home HRs usually better  2. HTN ***  Lopressor 25mg  BID PRN for SBP >160 I have advised she reach out to her PMD about her anxiety, discussed strategies for stress/anxiety management  3. PVD (prior left subclavian artery stent) Patent stent 4. renal stent Not mentioned in report, no obstructive disease described  Incidentally, + mesenteric/celiac artery disease     No symptoms  4. HLD     Labs monitored by her  PMD  Disposition:   Current  medicines are reviewed at length with the patient today.  The patient did not have any concerns regarding medicines.  Venetia Night, PA-C 03/31/2022 7:02 AM     [facility] [place] [address] [phone] (office)  [phone] (fax)

## 2022-04-01 ENCOUNTER — Ambulatory Visit: Payer: PPO | Admitting: Physician Assistant

## 2022-05-19 NOTE — Progress Notes (Unsigned)
Cardiology Office Note Date:  12/27/2021  Patient ID:  Kristin Schmidt December 09, 1955, MRN [id number] PCP:  Deborah Chalk, FNP  Cardiologist/Electrophysiologist: Dr. Curt Bears     Chief Complaint:  *** 3 mo ???  History of Present Illness: Kristin Schmidt is a 67 y.o. female with history of HTN, atherosclerosis (underwent cardiac cath in [place] in 2007 and had renal artery stent placed, as well as a stent to her left subclavian), sinus vs atrial tachycardia. 2017 presented to the ER with CP, underwent negative stress test.  She quit smoking 2016 though still wears nicotine patches, mentions everyone at work smokes and is still hard for her  She saw Dr. Curt Bears  Nov 2019, she was doing well, no symptoms of her tachycardia, BP controlled, no changes were made, following herlipids with PMD.  Saw A. Chalmers Cater, [place] Nov 2020, she arrived in [place] 129bpm, the patient reporting better at home, her dilt increased to 240mg  daily and planned for echo, labs She was encouraged to get a wearable HR tracker Labs looked OK, TSH was wnl Echo noted preserved LVEF, no significant VHD  I saw her Nov 2021 She is doing well. No CP, palpitations or cardiac awareness She denies any dizzy spells, no near syncope or syncope. She exercises sounds mostly chair exercises and stretching denies any difficulty with her ADLs No SOB or DOE Her primary started her this year on lipitor, but she developed aching muscles especially in her back that resolved when she took a week or so off it. She reported home HRs 80's-90's, urged to wean down her nicotine patches    Pt reached out recently about high BPs, planned to see her PMD and let us know if anything was needed after she saw her PMD, advised EP not typically mange BP  I saw her 11/28/21 She feels well but has started to notice more often elevated BP, mostly associated with more stressful days but also noting some lower numbers a swell,  High for her is  towards 160's and low towards 110's No particular symptoms She is not smoking Still uses high dose nicotine patch but not regularly, sometimes at work given coworkers smoke No CP, palpitations No SOB No near syncope or syncope. No arm claudication  EKG again noted ST 111bpm, reported home HRs typically 80's-90's.  Mildly uneven BPs, given her vascular history planned for arterial studies, otherwise annual visit [place] upper extremities noted not arterial disease with patent stent Renal US also imaged mesenteric, renal arteries with no obstructive disease, did note 70 to 99% stenosis in the celiac artery and superior mesenteric artery.   Advised to come in to review for any symptoms.  I did d/w one of our cardiologits, without prandial symptoms, no further would be needed.  I saw her 12/28/21 She feels well. No CP, palpitations or cardiac awareness No abdominal pain, postprandial discomfort of any kind. No SOB She worries about her BP, though has noted that if she gets a higher reading she gets anxious and the BP just continues upwards. She does tend to be an anxious person, and knows that this contributes to her BP. No near syncope or syncope. As soon as we are setting up to check her cuff, she mentions she is nervous, and looks it Manual by myself:  188/95 >> 190/88 Her cuff: hers : 200/100 >> 188/104 Her 1st initial rooming BP 160/78 Rx Lopressor 25mg  BID PRN for SBP >160 I have advised she reach out to her PMD  about her anxiety, discussed strategies for stress she had no abdominal/post prandial symptoms. Recommended an annual visit  *** symptoms *** BP *** ST? *** nicotine patch?  Smoking?   Past Medical History:  Diagnosis Date   Coronary artery disease 2007   renal artery stent and subclavian artery stent - [place]   Hypertension    Pneumonia    "walking pneumonia"   Renal disorder    Tachycardia    pt was treated with Carvedilol, taken off of it in May, 2018 due to  feeling fatigued.    Past Surgical History:  Procedure Laterality Date   CESAREAN SECTION     FRACTURE SURGERY Left    leg   ORIF HUMERUS FRACTURE Left 02/20/2017   Procedure: OPEN REDUCTION INTERNAL FIXATION (ORIF) LEFT PROXIMAL HUMERUS FRACTURE;  Surgeon: Justice Britain, MD;  Location: [place];  Service: Orthopedics;  Laterality: Left;   stent placement right kidney       Current Outpatient Medications  Medication Sig Dispense Refill   diltiazem (CARDIZEM CD) 240 MG 24 hr capsule Take 1 capsule (240 mg total) by mouth daily. Please make overdue appt with Dr. Curt Bears before anymore refills. Thank you 1st attempt 30 capsule 0   losartan (COZAAR) 100 MG tablet Take 100 mg by mouth every evening.      Magnesium Oxide 250 MG TABS Take 250 mg by mouth daily.     melatonin 5 MG TABS Take 5 mg by mouth daily.     nicotine (NICODERM CQ - DOSED IN MG/24 HOURS) 21 mg/24hr patch Place 21 mg onto the skin daily as needed (for smoking).     TURMERIC PO Take 1 tablet by mouth daily.     zolpidem (AMBIEN) 5 MG tablet Take 5 mg by mouth at bedtime as needed for sleep.     No current facility-administered medications for this visit.    Allergies:   Hydrocodone-acetaminophen and Penicillins   Social History:  The patient  reports that she quit smoking about 6 years ago. Her smoking use included cigarettes. She has never used smokeless tobacco. She reports current alcohol use. She reports that she does not use drugs.   Family History:  The patient's family history includes COPD in her father and mother; Cancer in her sister; Heart attack in her maternal grandfather.   ROS:  Please see the history of present illness.    All other systems are reviewed and otherwise negative.   PHYSICAL EXAM:  VS:  There were no vitals taken for this visit. BMI: There is no height or weight on file to calculate BMI. Well nourished, well developed, in no acute distress HEENT: normocephalic, atraumatic Neck: no JVD,  carotid bruits or masses Cardiac:  *** RRR; no significant murmurs, no rubs, or gallops Lungs: *** CTA b/l, no wheezing, rhonchi or rales Abd: soft, nontender MS: no deformity or atrophy Ext: ***  no edema Skin: warm and dry, no rash Neuro:  No gross deficits appreciated Psych: euthymic mood, full affect   EKG:  not done today   12/17/21: b/l UE art [place] Summary:  Left: No obstruction visualized in the left upper extremity. The        left subclavian artery appears patent, s/p history of stent        placement.  Summary:  Right: No significant arterial obstruction detected in the right         upper extremity.  Left: No significant arterial obstruction detected in the left upper  extremity.   12/17/21: Renal art US Renal:  Right: 1-59% stenosis of the right renal artery. RRV flow present.         Normal size right kidney. Abnormal right Resistive Index.         Normal cortical thickness of right kidney.  Left:  1-59% stenosis of the left renal artery. LRV flow present.         Normal size of left kidney. Abnormal left Resisitve Index.         Normal cortical thickness of the left kidney.  Mesenteric:  70 to 99% stenosis in the celiac artery and superior mesenteric artery.      04/16/2019: TTE IMPRESSIONS   1. Left ventricular ejection fraction, by visual estimation, is 55 to  60%. The left ventricle has normal function. Left ventricular septal wall  thickness was mildly increased. There is no left ventricular hypertrophy.   2. The left ventricle has no regional wall motion abnormalities.   3. Global right ventricle has normal systolic function.The right  ventricular size is normal. No increase in right ventricular wall  thickness.   4. Left atrial size was normal.   5. Right atrial size was normal.   6. The mitral valve is normal in structure. Trivial mitral valve  regurgitation. No evidence of mitral stenosis.   7. The tricuspid valve is normal in structure.  Tricuspid valve  regurgitation is not demonstrated.   8. The aortic valve is normal in structure. Aortic valve regurgitation is  not visualized. Mild to moderate aortic valve sclerosis/calcification  without any evidence of aortic stenosis.   9. Calcified left coronary cusp no AS.  10. The pulmonic valve was normal in structure. Pulmonic valve  regurgitation is not visualized.  11. The inferior vena cava is normal in size with greater than 50%  respiratory variability, suggesting right atrial pressure of 3 mmHg.   FINDINGS   Left Ventricle: Left ventricular ejection fraction, by visual estimation,  is 55 to 60%. The left ventricle has normal function. The left ventricle  has no regional wall motion abnormalities. There is no left ventricular  hypertrophy. Normal left atrial  pressure.    SPECT 02/28/16 The left ventricular ejection fraction is hyperdynamic (>65%). Nuclear stress EF: 77%. There was no ST segment deviation noted during stress. The study is normal. This is a low risk study.    Recent Labs: 11/28/2021: BUN 22; Creatinine, Ser 1.04; Potassium 5.0; Sodium 131  No results found for requested labs within last 365 days.   CrCl cannot be calculated (Patient's most recent lab result is older than the maximum 21 days allowed.).   Wt Readings from Last 3 Encounters:  11/28/21 149 lb 9.6 oz (67.9 kg)  03/20/20 147 lb 9.6 oz (67 kg)  03/19/19 146 lb (66.2 kg)     Other studies reviewed: Additional studies/records reviewed today include: summarized above  ASSESSMENT AND PLAN:  1. Sinus vs atrial tachycardia     Occasional use of nicotine patch only     *** Reports home HRs usually better  2. HTN Home readings are better the here *** .    3. PVD (prior left subclavian artery stent) Patent stent 4.  renal stent Not mentioned in report, no obstructive disease described  Incidentally, + mesenteric/celiac artery disease  ***  No symptoms  4. HLD     Labs  monitored by her PMD  Disposition: ***   Current medicines are reviewed at length with the patient today.  The patient did not have any concerns regarding medicines.  Norma Fredrickson, PA-C 12/27/2021 7:47 AM     [facility] [address] [place] [address] [phone] (office)  [phone] (fax)

## 2022-05-21 ENCOUNTER — Ambulatory Visit: Payer: PPO | Attending: Physician Assistant | Admitting: Physician Assistant

## 2022-05-21 ENCOUNTER — Encounter: Payer: Self-pay | Admitting: Physician Assistant

## 2022-05-21 VITALS — BP 118/60 | HR 97 | Ht 62.0 in | Wt 149.0 lb

## 2022-05-21 DIAGNOSIS — I10 Essential (primary) hypertension: Secondary | ICD-10-CM | POA: Diagnosis not present

## 2022-05-21 DIAGNOSIS — I739 Peripheral vascular disease, unspecified: Secondary | ICD-10-CM | POA: Diagnosis not present

## 2022-05-21 DIAGNOSIS — R000 Tachycardia, unspecified: Secondary | ICD-10-CM | POA: Diagnosis not present

## 2022-05-21 NOTE — Patient Instructions (Signed)
Medication Instructions:   Your physician recommends that you continue on your current medications as directed. Please refer to the Current Medication list given to you today.   *If you need a refill on your cardiac medications before your next appointment, please call your pharmacy*   Lab Work: NONE ORDERED  TODAY    If you have labs (blood work) drawn today and your tests are completely normal, you will receive your results only by: MyChart Message (if you have MyChart) OR A paper copy in the mail If you have any lab test that is abnormal or we need to change your treatment, we will call you to review the results.   Testing/Procedures: NONE ORDERED  TODAY    Follow-Up: At [facility], you and your health needs are our priority.  As part of our continuing mission to provide you with exceptional heart care, we have created designated Provider Care Teams.  These Care Teams include your primary Cardiologist (physician) and Advanced Practice Providers (APPs -  Physician Assistants and Nurse Practitioners) who all work together to provide you with the care you need, when you need it.  We recommend signing up for the patient portal called "MyChart".  Sign up information is provided on this After Visit Summary.  MyChart is used to connect with patients for Virtual Visits (Telemedicine).  Patients are able to view lab/test results, encounter notes, upcoming appointments, etc.  Non-urgent messages can be sent to your provider as well.   To learn more about what you can do with MyChart, go to [email or web address].    Your next appointment:   6 month(s)  Provider:   You may see Dr. Camnitz  or one of the following Advanced Practice Providers on your designated Care Team:   Renee Ursuy, PA-C   Other Instructions  

## 2022-05-24 ENCOUNTER — Other Ambulatory Visit: Payer: Self-pay

## 2022-05-24 ENCOUNTER — Inpatient Hospital Stay (HOSPITAL_COMMUNITY)
Admission: EM | Admit: 2022-05-24 | Discharge: 2022-05-28 | DRG: 481 | Disposition: A | Discharge status: Skilled Nursing Facility | Payer: PPO | Attending: Internal Medicine | Admitting: Internal Medicine

## 2022-05-24 ENCOUNTER — Encounter (HOSPITAL_COMMUNITY): Payer: Self-pay

## 2022-05-24 ENCOUNTER — Inpatient Hospital Stay (HOSPITAL_COMMUNITY): Payer: PPO | Admitting: Certified Registered Nurse Anesthetist

## 2022-05-24 ENCOUNTER — Inpatient Hospital Stay (HOSPITAL_COMMUNITY): Payer: PPO

## 2022-05-24 ENCOUNTER — Encounter (HOSPITAL_COMMUNITY)
Admission: EM | Disposition: A | Discharge status: Skilled Nursing Facility | Payer: Self-pay | Source: Home / Self Care | Attending: Internal Medicine

## 2022-05-24 ENCOUNTER — Emergency Department (HOSPITAL_COMMUNITY): Payer: PPO

## 2022-05-24 DIAGNOSIS — Z825 Family history of asthma and other chronic lower respiratory diseases: Secondary | ICD-10-CM

## 2022-05-24 DIAGNOSIS — N1831 Chronic kidney disease, stage 3a: Secondary | ICD-10-CM | POA: Diagnosis present

## 2022-05-24 DIAGNOSIS — Z8249 Family history of ischemic heart disease and other diseases of the circulatory system: Secondary | ICD-10-CM | POA: Diagnosis not present

## 2022-05-24 DIAGNOSIS — S72141A Displaced intertrochanteric fracture of right femur, initial encounter for closed fracture: Principal | ICD-10-CM | POA: Diagnosis present

## 2022-05-24 DIAGNOSIS — Z87891 Personal history of nicotine dependence: Secondary | ICD-10-CM

## 2022-05-24 DIAGNOSIS — D539 Nutritional anemia, unspecified: Secondary | ICD-10-CM | POA: Diagnosis present

## 2022-05-24 DIAGNOSIS — N183 Chronic kidney disease, stage 3 unspecified: Secondary | ICD-10-CM | POA: Diagnosis present

## 2022-05-24 DIAGNOSIS — Z888 Allergy status to other drugs, medicaments and biological substances status: Secondary | ICD-10-CM | POA: Diagnosis not present

## 2022-05-24 DIAGNOSIS — Z79899 Other long term (current) drug therapy: Secondary | ICD-10-CM | POA: Diagnosis not present

## 2022-05-24 DIAGNOSIS — I4719 Other supraventricular tachycardia: Secondary | ICD-10-CM | POA: Diagnosis present

## 2022-05-24 DIAGNOSIS — D72829 Elevated white blood cell count, unspecified: Secondary | ICD-10-CM | POA: Diagnosis present

## 2022-05-24 DIAGNOSIS — Y92009 Unspecified place in unspecified non-institutional (private) residence as the place of occurrence of the external cause: Secondary | ICD-10-CM | POA: Diagnosis not present

## 2022-05-24 DIAGNOSIS — N179 Acute kidney failure, unspecified: Secondary | ICD-10-CM | POA: Diagnosis not present

## 2022-05-24 DIAGNOSIS — D62 Acute posthemorrhagic anemia: Secondary | ICD-10-CM | POA: Diagnosis not present

## 2022-05-24 DIAGNOSIS — W06XXXA Fall from bed, initial encounter: Secondary | ICD-10-CM | POA: Diagnosis present

## 2022-05-24 DIAGNOSIS — S72001A Fracture of unspecified part of neck of right femur, initial encounter for closed fracture: Principal | ICD-10-CM

## 2022-05-24 DIAGNOSIS — M25551 Pain in right hip: Secondary | ICD-10-CM | POA: Diagnosis present

## 2022-05-24 DIAGNOSIS — S728X1A Other fracture of right femur, initial encounter for closed fracture: Secondary | ICD-10-CM | POA: Diagnosis present

## 2022-05-24 DIAGNOSIS — I2510 Atherosclerotic heart disease of native coronary artery without angina pectoris: Secondary | ICD-10-CM | POA: Diagnosis present

## 2022-05-24 DIAGNOSIS — I959 Hypotension, unspecified: Secondary | ICD-10-CM | POA: Diagnosis present

## 2022-05-24 DIAGNOSIS — Z880 Allergy status to penicillin: Secondary | ICD-10-CM | POA: Diagnosis not present

## 2022-05-24 DIAGNOSIS — I129 Hypertensive chronic kidney disease with stage 1 through stage 4 chronic kidney disease, or unspecified chronic kidney disease: Secondary | ICD-10-CM | POA: Diagnosis present

## 2022-05-24 HISTORY — DX: Other complications of anesthesia, initial encounter: T88.59XA

## 2022-05-24 HISTORY — PX: INTRAMEDULLARY (IM) NAIL INTERTROCHANTERIC: SHX5875

## 2022-05-24 LAB — CBC
HCT: 33.2 % — ABNORMAL LOW (ref 36.0–46.0)
Hemoglobin: 11.1 g/dL — ABNORMAL LOW (ref 12.0–15.0)
MCH: 33.7 pg (ref 26.0–34.0)
MCHC: 33.4 g/dL (ref 30.0–36.0)
MCV: 100.9 fL — ABNORMAL HIGH (ref 80.0–100.0)
Platelets: 297 K/uL (ref 150–400)
RBC: 3.29 MIL/uL — ABNORMAL LOW (ref 3.87–5.11)
RDW: 13.1 % (ref 11.5–15.5)
WBC: 11.5 K/uL — ABNORMAL HIGH (ref 4.0–10.5)
nRBC: 0 % (ref 0.0–0.2)

## 2022-05-24 LAB — COMPREHENSIVE METABOLIC PANEL WITH GFR
ALT: 20 U/L (ref 0–44)
AST: 22 U/L (ref 15–41)
Albumin: 3.3 g/dL — ABNORMAL LOW (ref 3.5–5.0)
Alkaline Phosphatase: 56 U/L (ref 38–126)
Anion gap: 13 (ref 5–15)
BUN: 23 mg/dL (ref 8–23)
CO2: 18 mmol/L — ABNORMAL LOW (ref 22–32)
Calcium: 8.5 mg/dL — ABNORMAL LOW (ref 8.9–10.3)
Chloride: 104 mmol/L (ref 98–111)
Creatinine, Ser: 1.07 mg/dL — ABNORMAL HIGH (ref 0.44–1.00)
GFR, Estimated: 57 mL/min — ABNORMAL LOW
Glucose, Bld: 126 mg/dL — ABNORMAL HIGH (ref 70–99)
Potassium: 4.2 mmol/L (ref 3.5–5.1)
Sodium: 135 mmol/L (ref 135–145)
Total Bilirubin: 0.5 mg/dL (ref 0.3–1.2)
Total Protein: 5.9 g/dL — ABNORMAL LOW (ref 6.5–8.1)

## 2022-05-24 LAB — TYPE AND SCREEN
ABO/RH(D): A POS
Antibody Screen: NEGATIVE

## 2022-05-24 LAB — SURGICAL PCR SCREEN
MRSA, PCR: NEGATIVE
Staphylococcus aureus: NEGATIVE

## 2022-05-24 LAB — ABO/RH: ABO/RH(D): A POS

## 2022-05-24 LAB — HIV ANTIBODY (ROUTINE TESTING W REFLEX): HIV Screen 4th Generation wRfx: NONREACTIVE

## 2022-05-24 LAB — VITAMIN D 25 HYDROXY (VIT D DEFICIENCY, FRACTURES): Vit D, 25-Hydroxy: 91.85 ng/mL (ref 30–100)

## 2022-05-24 LAB — GLUCOSE, CAPILLARY: Glucose-Capillary: 172 mg/dL — ABNORMAL HIGH (ref 70–99)

## 2022-05-24 SURGERY — FIXATION, FRACTURE, INTERTROCHANTERIC, WITH INTRAMEDULLARY ROD
Anesthesia: General | Site: Leg Upper | Laterality: Right

## 2022-05-24 MED ORDER — CEFAZOLIN SODIUM-DEXTROSE 2-4 GM/100ML-% IV SOLN
2.0000 g | Freq: Three times a day (TID) | INTRAVENOUS | Status: AC
  Administered 2022-05-25 (×3): 2 g via INTRAVENOUS
  Filled 2022-05-24 (×3): qty 100

## 2022-05-24 MED ORDER — METOCLOPRAMIDE HCL 5 MG/ML IJ SOLN: 5.0000 mg | Freq: Three times a day (TID) | INTRAMUSCULAR | Status: DC | PRN

## 2022-05-24 MED ORDER — METOCLOPRAMIDE HCL 5 MG PO TABS: 5.0000 mg | ORAL_TABLET | Freq: Three times a day (TID) | ORAL | Status: DC | PRN

## 2022-05-24 MED ORDER — AMISULPRIDE (ANTIEMETIC) 5 MG/2ML IV SOLN
INTRAVENOUS | Status: AC
  Administered 2022-05-24: 10 mg
  Filled 2022-05-24: qty 4

## 2022-05-24 MED ORDER — HYDROMORPHONE HCL 1 MG/ML IJ SOLN
INTRAMUSCULAR | Status: AC
  Filled 2022-05-24: qty 1

## 2022-05-24 MED ORDER — DEXMEDETOMIDINE HCL IN NACL 80 MCG/20ML IV SOLN
INTRAVENOUS | Status: AC
  Filled 2022-05-24: qty 20

## 2022-05-24 MED ORDER — MORPHINE SULFATE (PF) 2 MG/ML IV SOLN
0.5000 mg | INTRAVENOUS | Status: DC | PRN
  Administered 2022-05-24: 0.5 mg via INTRAVENOUS
  Filled 2022-05-24: qty 1

## 2022-05-24 MED ORDER — ROPIVACAINE HCL 5 MG/ML IJ SOLN
INTRAMUSCULAR | Status: DC | PRN
  Administered 2022-05-24: 20 mL via PERINEURAL

## 2022-05-24 MED ORDER — KETOROLAC TROMETHAMINE 30 MG/ML IJ SOLN
30.0000 mg | Freq: Once | INTRAMUSCULAR | Status: AC | PRN
  Administered 2022-05-24: 30 mg via INTRAVENOUS

## 2022-05-24 MED ORDER — CHLORHEXIDINE GLUCONATE 0.12 % MT SOLN: 15.0000 mL | Freq: Once | OROMUCOSAL | Status: AC

## 2022-05-24 MED ORDER — OXYCODONE HCL 5 MG/5ML PO SOLN: 5.0000 mg | Freq: Once | ORAL | Status: DC | PRN

## 2022-05-24 MED ORDER — VANCOMYCIN HCL 1000 MG IV SOLR
INTRAVENOUS | Status: AC
  Filled 2022-05-24: qty 20

## 2022-05-24 MED ORDER — ORAL CARE MOUTH RINSE: 15.0000 mL | Freq: Once | OROMUCOSAL | Status: AC

## 2022-05-24 MED ORDER — TRANEXAMIC ACID-NACL 1000-0.7 MG/100ML-% IV SOLN
1000.0000 mg | Freq: Once | INTRAVENOUS | Status: AC
  Administered 2022-05-24: 1000 mg via INTRAVENOUS
  Filled 2022-05-24: qty 100

## 2022-05-24 MED ORDER — SODIUM CHLORIDE 0.9 % IV BOLUS
500.0000 mL | Freq: Once | INTRAVENOUS | Status: AC
  Administered 2022-05-24: 500 mL via INTRAVENOUS

## 2022-05-24 MED ORDER — LORAZEPAM 1 MG PO TABS: 1.0000 mg | ORAL_TABLET | ORAL | Status: AC | PRN

## 2022-05-24 MED ORDER — CHLORHEXIDINE GLUCONATE 4 % EX LIQD: 60.0000 mL | Freq: Once | CUTANEOUS | Status: DC

## 2022-05-24 MED ORDER — CEFAZOLIN SODIUM-DEXTROSE 2-4 GM/100ML-% IV SOLN
2.0000 g | INTRAVENOUS | Status: AC
  Administered 2022-05-24: 2 g via INTRAVENOUS
  Filled 2022-05-24: qty 100

## 2022-05-24 MED ORDER — METOPROLOL TARTRATE 5 MG/5ML IV SOLN
2.0000 mg | Freq: Once | INTRAVENOUS | Status: AC
  Administered 2022-05-24: 2 mg via INTRAVENOUS

## 2022-05-24 MED ORDER — PROPOFOL 10 MG/ML IV BOLUS
INTRAVENOUS | Status: DC | PRN
  Administered 2022-05-24: 150 mg via INTRAVENOUS

## 2022-05-24 MED ORDER — KETOROLAC TROMETHAMINE 30 MG/ML IJ SOLN
INTRAMUSCULAR | Status: AC
  Filled 2022-05-24: qty 1

## 2022-05-24 MED ORDER — FENTANYL CITRATE (PF) 100 MCG/2ML IJ SOLN: 50.0000 ug | Freq: Once | INTRAMUSCULAR | Status: AC

## 2022-05-24 MED ORDER — ENOXAPARIN SODIUM 40 MG/0.4ML IJ SOSY: 40.0000 mg | PREFILLED_SYRINGE | INTRAMUSCULAR | Status: DC

## 2022-05-24 MED ORDER — ENOXAPARIN SODIUM 40 MG/0.4ML IJ SOSY
40.0000 mg | PREFILLED_SYRINGE | INTRAMUSCULAR | Status: DC
  Administered 2022-05-25 – 2022-05-28 (×4): 40 mg via SUBCUTANEOUS
  Filled 2022-05-24 (×4): qty 0.4

## 2022-05-24 MED ORDER — ADULT MULTIVITAMIN W/MINERALS CH
1.0000 | ORAL_TABLET | Freq: Every day | ORAL | Status: DC
  Administered 2022-05-25 – 2022-05-28 (×4): 1 via ORAL
  Filled 2022-05-24 (×4): qty 1

## 2022-05-24 MED ORDER — HYDROCODONE-ACETAMINOPHEN 5-325 MG PO TABS
1.0000 | ORAL_TABLET | ORAL | Status: DC | PRN
  Administered 2022-05-27: 2 via ORAL
  Filled 2022-05-24: qty 2

## 2022-05-24 MED ORDER — METHOCARBAMOL 1000 MG/10ML IJ SOLN: 500.0000 mg | Freq: Four times a day (QID) | INTRAVENOUS | Status: DC | PRN

## 2022-05-24 MED ORDER — THIAMINE MONONITRATE 100 MG PO TABS
100.0000 mg | ORAL_TABLET | Freq: Every day | ORAL | Status: DC
  Administered 2022-05-25 – 2022-05-28 (×4): 100 mg via ORAL
  Filled 2022-05-24 (×4): qty 1

## 2022-05-24 MED ORDER — PHENYLEPHRINE 80 MCG/ML (10ML) SYRINGE FOR IV PUSH (FOR BLOOD PRESSURE SUPPORT)
PREFILLED_SYRINGE | INTRAVENOUS | Status: AC
  Filled 2022-05-24: qty 10

## 2022-05-24 MED ORDER — NALOXONE HCL 0.4 MG/ML IJ SOLN: 0.4000 mg | INTRAMUSCULAR | Status: DC | PRN

## 2022-05-24 MED ORDER — PROPOFOL 10 MG/ML IV BOLUS
INTRAVENOUS | Status: AC
  Filled 2022-05-24: qty 20

## 2022-05-24 MED ORDER — THIAMINE HCL 100 MG/ML IJ SOLN
100.0000 mg | Freq: Every day | INTRAMUSCULAR | Status: DC
  Filled 2022-05-24: qty 2

## 2022-05-24 MED ORDER — LACTATED RINGERS IV SOLN: INTRAVENOUS | Status: DC

## 2022-05-24 MED ORDER — MIDAZOLAM HCL 2 MG/2ML IJ SOLN: 1.0000 mg | Freq: Once | INTRAMUSCULAR | Status: AC

## 2022-05-24 MED ORDER — PHENYLEPHRINE 80 MCG/ML (10ML) SYRINGE FOR IV PUSH (FOR BLOOD PRESSURE SUPPORT)
PREFILLED_SYRINGE | INTRAVENOUS | Status: DC | PRN
  Administered 2022-05-24 (×2): 80 ug via INTRAVENOUS

## 2022-05-24 MED ORDER — FENTANYL CITRATE (PF) 100 MCG/2ML IJ SOLN
INTRAMUSCULAR | Status: AC
  Administered 2022-05-24: 50 ug via INTRAVENOUS
  Filled 2022-05-24: qty 2

## 2022-05-24 MED ORDER — DEXAMETHASONE SODIUM PHOSPHATE 10 MG/ML IJ SOLN
INTRAMUSCULAR | Status: AC
  Filled 2022-05-24: qty 1

## 2022-05-24 MED ORDER — PHENYLEPHRINE HCL-NACL 20-0.9 MG/250ML-% IV SOLN
INTRAVENOUS | Status: DC | PRN
  Administered 2022-05-24: 20 ug/min via INTRAVENOUS

## 2022-05-24 MED ORDER — METHOCARBAMOL 500 MG PO TABS
500.0000 mg | ORAL_TABLET | Freq: Four times a day (QID) | ORAL | Status: DC | PRN
  Administered 2022-05-25 – 2022-05-28 (×10): 500 mg via ORAL
  Filled 2022-05-24 (×10): qty 1

## 2022-05-24 MED ORDER — ONDANSETRON HCL 4 MG/2ML IJ SOLN
INTRAMUSCULAR | Status: DC | PRN
  Administered 2022-05-24: 4 mg via INTRAVENOUS

## 2022-05-24 MED ORDER — MIDAZOLAM HCL 2 MG/2ML IJ SOLN
INTRAMUSCULAR | Status: AC
  Administered 2022-05-24: 1 mg via INTRAVENOUS
  Filled 2022-05-24: qty 2

## 2022-05-24 MED ORDER — ONDANSETRON HCL 4 MG/2ML IJ SOLN: 4.0000 mg | Freq: Once | INTRAMUSCULAR | Status: DC | PRN

## 2022-05-24 MED ORDER — OXYCODONE HCL 5 MG PO TABS: 5.0000 mg | ORAL_TABLET | Freq: Once | ORAL | Status: DC | PRN

## 2022-05-24 MED ORDER — DEXAMETHASONE SODIUM PHOSPHATE 10 MG/ML IJ SOLN
INTRAMUSCULAR | Status: DC | PRN
  Administered 2022-05-24: 5 mg via INTRAVENOUS

## 2022-05-24 MED ORDER — 0.9 % SODIUM CHLORIDE (POUR BTL) OPTIME
TOPICAL | Status: DC | PRN
  Administered 2022-05-24: 1000 mL

## 2022-05-24 MED ORDER — ACETAMINOPHEN 325 MG PO TABS
325.0000 mg | ORAL_TABLET | Freq: Four times a day (QID) | ORAL | Status: DC | PRN
  Administered 2022-05-25 (×2): 650 mg via ORAL
  Filled 2022-05-24 (×2): qty 2

## 2022-05-24 MED ORDER — LIDOCAINE 2% (20 MG/ML) 5 ML SYRINGE
INTRAMUSCULAR | Status: AC
  Filled 2022-05-24: qty 5

## 2022-05-24 MED ORDER — DEXMEDETOMIDINE HCL IN NACL 80 MCG/20ML IV SOLN
INTRAVENOUS | Status: DC | PRN
  Administered 2022-05-24: 4 ug via BUCCAL

## 2022-05-24 MED ORDER — NALOXONE HCL 0.4 MG/ML IJ SOLN
0.2000 mg | INTRAMUSCULAR | Status: DC | PRN
  Administered 2022-05-24: 0.2 mg via INTRAVENOUS

## 2022-05-24 MED ORDER — ONDANSETRON HCL 4 MG/2ML IJ SOLN
4.0000 mg | Freq: Four times a day (QID) | INTRAMUSCULAR | Status: DC | PRN
  Administered 2022-05-24 – 2022-05-27 (×4): 4 mg via INTRAVENOUS
  Filled 2022-05-24 (×4): qty 2

## 2022-05-24 MED ORDER — HYDROCODONE-ACETAMINOPHEN 7.5-325 MG PO TABS
1.0000 | ORAL_TABLET | ORAL | Status: DC | PRN
  Administered 2022-05-25 – 2022-05-26 (×3): 2 via ORAL
  Administered 2022-05-26: 1 via ORAL
  Administered 2022-05-26 – 2022-05-28 (×7): 2 via ORAL
  Filled 2022-05-24 (×3): qty 2
  Filled 2022-05-24: qty 1
  Filled 2022-05-24 (×7): qty 2

## 2022-05-24 MED ORDER — LIDOCAINE-EPINEPHRINE (PF) 1.5 %-1:200000 IJ SOLN
INTRAMUSCULAR | Status: DC | PRN
  Administered 2022-05-24: 10 mL via PERINEURAL

## 2022-05-24 MED ORDER — POLYETHYLENE GLYCOL 3350 17 G PO PACK
17.0000 g | PACK | Freq: Every day | ORAL | Status: DC | PRN
  Administered 2022-05-26: 17 g via ORAL
  Filled 2022-05-24: qty 1

## 2022-05-24 MED ORDER — FENTANYL CITRATE (PF) 250 MCG/5ML IJ SOLN
INTRAMUSCULAR | Status: AC
  Filled 2022-05-24: qty 5

## 2022-05-24 MED ORDER — FENTANYL CITRATE (PF) 250 MCG/5ML IJ SOLN
INTRAMUSCULAR | Status: DC | PRN
  Administered 2022-05-24: 25 ug via INTRAVENOUS

## 2022-05-24 MED ORDER — MELATONIN 5 MG PO TABS
5.0000 mg | ORAL_TABLET | Freq: Every day | ORAL | Status: DC
  Administered 2022-05-24: 5 mg via ORAL
  Filled 2022-05-24: qty 1

## 2022-05-24 MED ORDER — ACETAMINOPHEN 10 MG/ML IV SOLN
INTRAVENOUS | Status: AC
  Filled 2022-05-24: qty 100

## 2022-05-24 MED ORDER — DOCUSATE SODIUM 100 MG PO CAPS
100.0000 mg | ORAL_CAPSULE | Freq: Two times a day (BID) | ORAL | Status: DC
  Administered 2022-05-24 – 2022-05-26 (×4): 100 mg via ORAL
  Filled 2022-05-24 (×5): qty 1

## 2022-05-24 MED ORDER — CHLORHEXIDINE GLUCONATE 0.12 % MT SOLN
OROMUCOSAL | Status: AC
  Administered 2022-05-24: 15 mL via OROMUCOSAL
  Filled 2022-05-24: qty 15

## 2022-05-24 MED ORDER — ROCURONIUM BROMIDE 10 MG/ML (PF) SYRINGE
PREFILLED_SYRINGE | INTRAVENOUS | Status: AC
  Filled 2022-05-24: qty 10

## 2022-05-24 MED ORDER — POVIDONE-IODINE 10 % EX SWAB
2.0000 | Freq: Once | CUTANEOUS | Status: AC
  Administered 2022-05-24: 2 via TOPICAL

## 2022-05-24 MED ORDER — HYDROMORPHONE HCL 1 MG/ML IJ SOLN
1.0000 mg | INTRAMUSCULAR | Status: DC | PRN
  Administered 2022-05-24: 1 mg via INTRAVENOUS
  Filled 2022-05-24: qty 1

## 2022-05-24 MED ORDER — FOLIC ACID 1 MG PO TABS
1.0000 mg | ORAL_TABLET | Freq: Every day | ORAL | Status: DC
  Administered 2022-05-25 – 2022-05-28 (×4): 1 mg via ORAL
  Filled 2022-05-24 (×4): qty 1

## 2022-05-24 MED ORDER — VANCOMYCIN HCL 1000 MG IV SOLR
INTRAVENOUS | Status: DC | PRN
  Administered 2022-05-24: 1000 mg via TOPICAL

## 2022-05-24 MED ORDER — ZOLPIDEM TARTRATE 5 MG PO TABS
5.0000 mg | ORAL_TABLET | Freq: Every evening | ORAL | Status: DC | PRN
  Administered 2022-05-25 – 2022-05-27 (×3): 5 mg via ORAL
  Filled 2022-05-24 (×3): qty 1

## 2022-05-24 MED ORDER — NALOXONE HCL 0.4 MG/ML IJ SOLN
INTRAMUSCULAR | Status: AC
  Filled 2022-05-24: qty 1

## 2022-05-24 MED ORDER — SENNOSIDES-DOCUSATE SODIUM 8.6-50 MG PO TABS
1.0000 | ORAL_TABLET | Freq: Every day | ORAL | Status: DC
  Administered 2022-05-24 – 2022-05-25 (×2): 1 via ORAL
  Filled 2022-05-24 (×2): qty 1

## 2022-05-24 MED ORDER — MORPHINE SULFATE (PF) 2 MG/ML IV SOLN: 0.5000 mg | INTRAVENOUS | Status: DC | PRN

## 2022-05-24 MED ORDER — METOPROLOL TARTRATE 5 MG/5ML IV SOLN
INTRAVENOUS | Status: AC
  Filled 2022-05-24: qty 5

## 2022-05-24 MED ORDER — HYDROMORPHONE HCL 1 MG/ML IJ SOLN
0.2500 mg | INTRAMUSCULAR | Status: DC | PRN
  Administered 2022-05-24 (×4): 0.5 mg via INTRAVENOUS

## 2022-05-24 MED ORDER — SODIUM CHLORIDE 0.9 % IV SOLN: INTRAVENOUS | Status: DC

## 2022-05-24 MED ORDER — HYDROCODONE-ACETAMINOPHEN 5-325 MG PO TABS
1.0000 | ORAL_TABLET | Freq: Four times a day (QID) | ORAL | Status: DC | PRN
  Administered 2022-05-24: 2 via ORAL
  Filled 2022-05-24: qty 2

## 2022-05-24 MED ORDER — NICOTINE 21 MG/24HR TD PT24: 21.0000 mg | MEDICATED_PATCH | Freq: Every day | TRANSDERMAL | Status: DC | PRN

## 2022-05-24 MED ORDER — ONDANSETRON HCL 4 MG/2ML IJ SOLN
INTRAMUSCULAR | Status: AC
  Filled 2022-05-24: qty 2

## 2022-05-24 SURGICAL SUPPLY — 50 items
BAG COUNTER SPONGE SURGICOUNT (BAG) IMPLANT
BIT DRILL INTERTAN LAG SCREW (BIT) IMPLANT
BIT DRILL LONG 4.0 (BIT) IMPLANT
BRUSH SCRUB EZ PLAIN DRY (MISCELLANEOUS) ×2 IMPLANT
CHLORAPREP W/TINT 26 (MISCELLANEOUS) ×1 IMPLANT
COVER PERINEAL POST (MISCELLANEOUS) ×1 IMPLANT
COVER SURGICAL LIGHT HANDLE (MISCELLANEOUS) ×1 IMPLANT
DERMABOND ADVANCED .7 DNX12 (GAUZE/BANDAGES/DRESSINGS) ×1 IMPLANT
DRAPE C-ARM 35X43 STRL (DRAPES) ×1 IMPLANT
DRAPE C-ARM 42X72 X-RAY (DRAPES) IMPLANT
DRAPE IMP U-DRAPE 54X76 (DRAPES) ×2 IMPLANT
DRAPE INCISE IOBAN 66X45 STRL (DRAPES) ×1 IMPLANT
DRAPE STERI IOBAN 125X83 (DRAPES) ×1 IMPLANT
DRAPE SURG 17X23 STRL (DRAPES) ×2 IMPLANT
DRAPE U-SHAPE 47X51 STRL (DRAPES) ×1 IMPLANT
DRESSING MEPILEX FLEX 4X4 (GAUZE/BANDAGES/DRESSINGS) IMPLANT
DRILL BIT LONG 4.0 (BIT) ×1
DRSG MEPILEX BORDER 4X4 (GAUZE/BANDAGES/DRESSINGS) ×1 IMPLANT
DRSG MEPILEX BORDER 4X8 (GAUZE/BANDAGES/DRESSINGS) ×1 IMPLANT
DRSG MEPILEX FLEX 4X4 (GAUZE/BANDAGES/DRESSINGS) ×1
DRSG MEPILEX POST OP 4X8 (GAUZE/BANDAGES/DRESSINGS) IMPLANT
ELECT REM PT RETURN 9FT ADLT (ELECTROSURGICAL) ×1
ELECTRODE REM PT RTRN 9FT ADLT (ELECTROSURGICAL) ×1 IMPLANT
GLOVE BIO SURGEON STRL SZ 6.5 (GLOVE) ×3 IMPLANT
GLOVE BIO SURGEON STRL SZ7.5 (GLOVE) ×4 IMPLANT
GLOVE BIOGEL PI IND STRL 6.5 (GLOVE) ×1 IMPLANT
GLOVE BIOGEL PI IND STRL 7.5 (GLOVE) ×1 IMPLANT
GOWN STRL REUS W/ TWL LRG LVL3 (GOWN DISPOSABLE) ×1 IMPLANT
GOWN STRL REUS W/TWL LRG LVL3 (GOWN DISPOSABLE) ×1
GUIDE PIN 3.2X343 (PIN) ×2
GUIDE PIN 3.2X343MM (PIN) ×2
KIT BASIN OR (CUSTOM PROCEDURE TRAY) ×1 IMPLANT
KIT TURNOVER KIT B (KITS) ×1 IMPLANT
MANIFOLD NEPTUNE II (INSTRUMENTS) ×1 IMPLANT
NAIL INTERTAN 10X18 130D 10S (Nail) IMPLANT
NS IRRIG 1000ML POUR BTL (IV SOLUTION) ×1 IMPLANT
PACK GENERAL/GYN (CUSTOM PROCEDURE TRAY) ×1 IMPLANT
PAD ARMBOARD 7.5X6 YLW CONV (MISCELLANEOUS) ×2 IMPLANT
PIN GUIDE 3.2X343MM (PIN) IMPLANT
SCREW LAG COMPR KIT 90/85 (Screw) IMPLANT
SCREW TRIGEN LOW PROF 5.0X35 (Screw) IMPLANT
SUT MNCRL AB 3-0 PS2 18 (SUTURE) ×1 IMPLANT
SUT MNCRL AB 3-0 PS2 27 (SUTURE) IMPLANT
SUT VIC AB 0 CT1 27 (SUTURE) ×1
SUT VIC AB 0 CT1 27XBRD ANBCTR (SUTURE) IMPLANT
SUT VIC AB 2-0 CT1 27 (SUTURE) ×1
SUT VIC AB 2-0 CT1 TAPERPNT 27 (SUTURE) ×2 IMPLANT
TOWEL GREEN STERILE (TOWEL DISPOSABLE) ×2 IMPLANT
TRAY CATH INTERMITTENT SS 16FR (CATHETERS) IMPLANT
WATER STERILE IRR 1000ML POUR (IV SOLUTION) ×1 IMPLANT

## 2022-05-24 NOTE — Significant Event (Signed)
Rapid Response Event Note   Reason for Call :  Somnolence  Initial Focused Assessment:  Pt from PACU. Recently received IV Dilaudid. Per pt's daughter, pt becomes overly sedated from Dilaudid. Pt bradypnea, RR 8. SpO2 in the 70s upon pt arrival from PACU and was placed on 100% NRB. Pt tachycardic at 113 bpm, heart rate regular. She has not received her cardiac medications today.   Pt on simple mask 9L when I arrived.   0.2mg  IV Narcan given. Pt immediately woke up. HR up to 155 bpm, initially. HR came down to 125 bpm. Lung sounds are clear, breathing is regular. Pt denies pain. She is oriented.   VS: T 98.83F, BP 128/69, HR 125, RR 12, SpO2 93% on room air CBG: 172  Interventions:  -Narcan 0.2mg  IV - pt quickly woke up with 0.2mg  having been administered  Plan of Care:  -PRN Narcan -Continuous pulse oximetry monitoring -Oxygen set-up at [facility]  Call rapid response for additional needs  Event Summary:  MD Notified: Dr. Harvest Forest, per primary RN Call Time: 7628 Arrival Time: 1903 End Time: [place], RN

## 2022-05-24 NOTE — Anesthesia Procedure Notes (Signed)
Anesthesia Procedure Image       

## 2022-05-24 NOTE — Anesthesia Preprocedure Evaluation (Signed)
Anesthesia Evaluation  Patient identified by MRN, date of birth, ID band Patient awake    Reviewed: Allergy & Precautions, H&P , NPO status , Patient's Chart, lab work & pertinent test results  Airway Mallampati: II  TM Distance: >3 FB Neck ROM: Full    Dental no notable dental hx.    Pulmonary neg pulmonary ROS, former smoker   Pulmonary exam normal breath sounds clear to auscultation       Cardiovascular hypertension, Pt. on medications + Peripheral Vascular Disease  Normal cardiovascular exam Rhythm:Regular Rate:Normal     Neuro/Psych negative neurological ROS  negative psych ROS   GI/Hepatic negative GI ROS, Neg liver ROS,,,  Endo/Other  negative endocrine ROS    Renal/GU negative Renal ROS  negative genitourinary   Musculoskeletal negative musculoskeletal ROS (+)    Abdominal   Peds negative pediatric ROS (+)  Hematology negative hematology ROS (+)   Anesthesia Other Findings   Reproductive/Obstetrics negative OB ROS                             Anesthesia Physical Anesthesia Plan  ASA: 2  Anesthesia Plan: General   Post-op Pain Management: Regional block*   Induction: Intravenous  PONV Risk Score and Plan: 3 and Ondansetron, Dexamethasone and Treatment may vary due to age or medical condition  Airway Management Planned: LMA  Additional Equipment:   Intra-op Plan:   Post-operative Plan: Extubation in OR  Informed Consent: I have reviewed the patients History and Physical, chart, labs and discussed the procedure including the risks, benefits and alternatives for the proposed anesthesia with the patient or authorized representative who has indicated his/her understanding and acceptance.     Dental advisory given  Plan Discussed with: CRNA and Surgeon  Anesthesia Plan Comments:        Anesthesia Quick Evaluation

## 2022-05-24 NOTE — Anesthesia Procedure Notes (Signed)
Anesthesia Regional Block: Femoral nerve block   Pre-Anesthetic Checklist: , timeout performed,  Correct Patient, Correct Site, Correct Laterality,  Correct Procedure, Correct Position, site marked,  Risks and benefits discussed,  Surgical consent,  Pre-op evaluation,  At surgeon's request and post-op pain management  Laterality: Right  Prep: chloraprep       Needles:  Injection technique: Single-shot  Needle Type: Echogenic Needle     Needle Length: 9cm      Additional Needles:   Procedures:,,,, ultrasound used (permanent image in chart),,    Narrative:  Start time: 05/24/2022 3:05 PM End time: 05/24/2022 3:12 PM Injection made incrementally with aspirations every 5 mL.  Performed by: Personally  Anesthesiologist: Myrtie Soman, MD  Additional Notes: Patient tolerated the procedure well without complications

## 2022-05-24 NOTE — ED Triage Notes (Signed)
Patient arrives via ems from home secondary to fall. Patient feel out of bed , she was able to  get up and walk to the restroom where she feel again and landed on her rt hip. Visible rt outer rotation and shortening of right leg. 100 mcg fentanyl was given prior arrival. Denies loc, no active bleeding noted.

## 2022-05-24 NOTE — H&P (View-Only) (Signed)
Reason for Consult:Right hip fx Referring Physician: Rondell Schmidt Time called: 0730 Time at bedside: 0920   Kristin Schmidt is an 67 y.o. female.  HPI: Kristin Schmidt was going to the bathroom when she slipped and fell. She had immediate right hip pain and could not get up. She was brought to the ED where x-rays showed a hip fx and orthopedic surgery was consulted. She lives alone and does not use any assistive devices for ambulation.  Past Medical History:  Diagnosis Date   Coronary artery disease 2007   renal artery stent and subclavian artery stent - [place]   Hypertension    Pneumonia    "walking pneumonia"   Renal disorder    Tachycardia    pt was treated with Carvedilol, taken off of it in May, 2018 due to feeling fatigued.    Past Surgical History:  Procedure Laterality Date   CESAREAN SECTION     FRACTURE SURGERY Left    leg   ORIF HUMERUS FRACTURE Left 02/20/2017   Procedure: OPEN REDUCTION INTERNAL FIXATION (ORIF) LEFT PROXIMAL HUMERUS FRACTURE;  Surgeon: Supple, Lonita Debes, MD;  Location: [facility] OR;  Service: Orthopedics;  Laterality: Left;   stent placement right kidney       Family History  Problem Relation Age of Onset   COPD Mother    COPD Father    Heart attack Maternal Grandfather    Cancer Sister     Social History:  reports that she quit smoking about 6 years ago. Her smoking use included cigarettes. She has never used smokeless tobacco. She reports current alcohol use. She reports that she does not use drugs.  Allergies:  Allergies  Allergen Reactions   Penicillins Itching    HIVES    Medications: I have reviewed the patient's current medications.  Results for orders placed or performed during the hospital encounter of 05/24/22 (from the past 48 hour(s))  CBC     Status: Abnormal   Collection Time: 05/24/22  4:55 AM  Result Value Ref Range   WBC 11.5 (H) 4.0 - 10.5 K/uL   RBC 3.29 (L) 3.87 - 5.11 MIL/uL   Hemoglobin 11.1 (L) 12.0 - 15.0 g/dL   HCT  33.2 (L) 36.0 - 46.0 %   MCV 100.9 (H) 80.0 - 100.0 fL   MCH 33.7 26.0 - 34.0 pg   MCHC 33.4 30.0 - 36.0 g/dL   RDW 13.1 11.5 - 15.5 %   Platelets 297 150 - 400 K/uL   nRBC 0.0 0.0 - 0.2 %    Comment: Performed at [facility], [address] [place], [place] [address]  Comprehensive metabolic panel     Status: Abnormal   Collection Time: 05/24/22  4:55 AM  Result Value Ref Range   Sodium 135 135 - 145 mmol/L   Potassium 4.2 3.5 - 5.1 mmol/L   Chloride 104 98 - 111 mmol/L   CO2 18 (L) 22 - 32 mmol/L   Glucose, Bld 126 (H) 70 - 99 mg/dL    Comment: Glucose reference range applies only to samples taken after fasting for at least 8 hours.   BUN 23 8 - 23 mg/dL   Creatinine, Ser 1.07 (H) 0.44 - 1.00 mg/dL   Calcium 8.5 (L) 8.9 - 10.3 mg/dL   Total Protein 5.9 (L) 6.5 - 8.1 g/dL   Albumin 3.3 (L) 3.5 - 5.0 g/dL   AST 22 15 - 41 U/L   ALT 20 0 - 44 U/L   Alkaline Phosphatase   56 38 - 126 U/L   Total Bilirubin 0.5 0.3 - 1.2 mg/dL   GFR, Estimated 57 (L) >60 mL/min    Comment: (NOTE) Calculated using the CKD-EPI Creatinine Equation (2021)    Anion gap 13 5 - 15    Comment: Performed at [facility] [address] [place], [place] [address]  ABO/Rh     Status: None (Preliminary result)   Collection Time: 05/24/22  4:55 AM  Result Value Ref Range   ABO/RH(D) PENDING     DG Hip Unilat W or Wo Pelvis 2-3 Views Right  Result Date: 05/24/2022 CLINICAL DATA:  Fall onto right hip. EXAM: DG HIP (WITH OR WITHOUT PELVIS) 2-3V RIGHT COMPARISON:  None Available. FINDINGS: Comminuted intertrochanteric fracture of the proximal right femur is noted. Mild superior scratch set there is mild impaction of the fracture fragments. No additional fracture or dislocation. No signs of pelvic diastasis. IMPRESSION: Comminuted intertrochanteric fracture of the proximal right femur. Electronically Signed   By: Kerby Moors M.D.   On: 05/24/2022 05:20    Review of Systems  HENT:  Negative for  ear discharge, ear pain, hearing loss and tinnitus.   Eyes:  Negative for photophobia and pain.  Respiratory:  Negative for cough and shortness of breath.   Cardiovascular:  Negative for chest pain.  Gastrointestinal:  Negative for abdominal pain, nausea and vomiting.  Genitourinary:  Negative for dysuria, flank pain, frequency and urgency.  Musculoskeletal:  Positive for arthralgias (Right hip). Negative for back pain, myalgias and neck pain.  Neurological:  Negative for dizziness and headaches.  Hematological:  Does not bruise/bleed easily.  Psychiatric/Behavioral:  The patient is not nervous/anxious.    Blood pressure (!) 95/55, pulse 94, temperature 98.7 F (37.1 C), temperature source Oral, resp. rate 16, height 5\' 2"  (1.575 m), weight 66.2 kg, SpO2 93 %. Physical Exam Constitutional:      General: She is not in acute distress.    Appearance: She is well-developed. She is not diaphoretic.  HENT:     Head: Normocephalic and atraumatic.  Eyes:     General: No scleral icterus.       Right eye: No discharge.        Left eye: No discharge.     Conjunctiva/sclera: Conjunctivae normal.  Cardiovascular:     Rate and Rhythm: Normal rate and regular rhythm.  Pulmonary:     Effort: Pulmonary effort is normal. No respiratory distress.  Musculoskeletal:     Cervical back: Normal range of motion.     Comments: RLE No traumatic wounds, ecchymosis, or rash  Mild TTP hip  No knee or ankle effusion  Knee stable to varus/ valgus and anterior/posterior stress  Sens DPN, SPN, TN intact  Motor EHL, ext, flex, evers 5/5  DP 2+, PT 2+, No significant edema  Skin:    General: Skin is warm and dry.  Neurological:     Mental Status: She is alert.  Psychiatric:        Mood and Affect: Mood normal.        Behavior: Behavior normal.     Assessment/Plan: Right hip fx -- Plan IMN today with either Dr. Doreatha Martin or Dr. Erlinda Hong. Please keep NPO. Multiple medical problems including CAD and HTN -- per  primary service    Lisette Abu, PA-C Orthopedic Surgery [phone] 05/24/2022, 9:25 AM    Patient seen and examined and agree with the note above.  67 year old female with a right intertrochanteric femur fracture.  Due to the unstable nature of her injury I recommend proceeding with a cephalomedullary nailing of her right hip.  Risks and benefits were discussed with the patient as well as her daughter over the phone.  Risks included but not limited to bleeding, infection, malunion, nonunion, hardware failure, hardware irritation, nerve or blood vessel injury, DVT, even the possibility anesthetic complications.  She agreed to proceed with surgery and consent was obtained.  Shona Needles, MD [facility] [phone] (office) [email or web address]

## 2022-05-24 NOTE — Anesthesia Procedure Notes (Signed)
Procedure Name: LMA Insertion Date/Time: 05/24/2022 3:37 PM  Performed by: Maude Leriche, CRNAPre-anesthesia Checklist: Patient identified, Emergency Drugs available, Suction available, Patient being monitored and Timeout performed Patient Re-evaluated:Patient Re-evaluated prior to induction Oxygen Delivery Method: Circle system utilized Preoxygenation: Pre-oxygenation with 100% oxygen Induction Type: IV induction LMA: LMA inserted LMA Size: 4.0 Number of attempts: 1 Tube secured with: Tape Dental Injury: Teeth and Oropharynx as per pre-operative assessment

## 2022-05-24 NOTE — Op Note (Signed)
Orthopaedic Surgery Operative Note (CSN: [id number] ) Date of Surgery: 05/24/2022  Admit Date: 05/24/2022   Diagnoses: Pre-Op Diagnoses: Right intertrochanteric femur fracture  Post-Op Diagnosis: Same  Procedures: CPT 27245-Cephalomedullary nailing of right intertrochanteric femur fracture  Surgeons : Primary: Shona Needles, MD  Assistant: Patrecia Pace, PA-C  Location: OR 3   Anesthesia: General   Antibiotics: Ancef 2g preop with 1gm vancomycin powder placed topically   Tourniquet time: None    Estimated Blood Loss: 50 mL  Complications: None   Specimens:None   Implants: Implant Name Type Inv. Item Serial No. Manufacturer Lot No. LRB No. Used Action  NAIL INTERTAN 10X18 130D 10S - [id number] Nail NAIL INTERTAN 10X18 130D 10S  [employer]  Right 1 Implanted  SCREW LAG COMPR KIT 90/85 - [id number] Screw SCREW LAG COMPR KIT 90/85  [employer] [id number] Right 1 Implanted  SCREW TRIGEN LOW PROF 5.0X35 - [id number] Screw SCREW TRIGEN LOW PROF 5.0X35  [employer] [id number] Right 1 Implanted     Indications for Surgery: 67 year old female who sustained a ground-level fall and has a right intertrochanteric femur fracture.  Due to the unstable nature of her injury I recommend proceeding with a cephalomedullary nailing of her right femur.  Risks and benefits were discussed with the patient.  Risks included but not limited to bleeding, infection, malunion, nonunion, hardware failure, hardware irritation, nerve or blood vessel injury, DVT, even the possibility anesthetic complications.  She agreed to proceed with surgery and consent was obtained.  Operative Findings: Cephalomedullary nailing of right intertrochanteric femur fracture using [employer] InterTAN 10 x 180 mm nail.  Procedure: The patient was identified in the preoperative holding area. Consent was confirmed with the patient and their family and all questions were  answered. The operative extremity was marked after confirmation with the patient. she was then brought back to the operating room by our anesthesia colleagues.  She was placed under general anesthetic and carefully transferred over to the [facility] table.  All bony prominences were well-padded.  Traction was applied to the right lower extremity and fluoroscopic imaging showed adequate alignment.  The right lower extremity was then prepped and draped in usual sterile fashion.  A timeout was performed to verify the patient, the procedure, and the extremity.  Preoperative antibiotics were dosed.  Small incision proximal to the greater trochanter was made and carried down through skin and subcutaneous tissue.  A threaded guidewire was placed at the tip of the greater trochanter and advanced into the proximal metaphysis.  An entry reamer was then used to enter the medullary canal.  A 10 x 180 mm nail attached to the targeting arm was placed in the center of the canal.  Using the targeting arm I then directed a threaded guidewire up into the head/neck segment.  I confirmed adequate tip apex distance using AP and lateral fluoroscopic imaging.  I then measured the length and chose to use a 90 mm lag screw.  I drilled the path for the compression screw and placed an antirotation bar.  I then drilled the path for the lag screw.  The lag screw was then placed and I then placed the compression screw and compressed approximately 7 mm to get adequate apposition of the fracture site.  I then statically locked the proximal portion of the nail.  I then used the targeting arm to place a distal interlocking screw into the femoral shaft.  The targeting arm was removed and  final fluoroscopic imaging was obtained.  The incisions were copiously irrigated.  A layered closure of 2-0 Vicryl and 3-0 Monocryl with Dermabond was used to close the skin.  Sterile dressings were applied.  The patient was then awoke from anesthesia and taken to the  PACU in stable condition.  Post Op Plan/Instructions: The patient will be weightbearing as tolerated to the right lower extremity.  She will receive postoperative Ancef.  She will receive Lovenox for DVT prophylaxis and discharged on an oral DOAC.  We will have her mobilize with physical and Occupational Therapy.  I was present and performed the entire surgery.  Patrecia Pace, PA-C did assist me throughout the case. An assistant was necessary given the difficulty in approach, maintenance of reduction and ability to instrument the fracture.   Kristin Hamming, MD [facility]

## 2022-05-24 NOTE — Progress Notes (Signed)
Chaplain responded to Rapid Response.  Staff working with patient.  Daughter is on the way. Chaplain on standby for support. Rev. Tamsen Snider Pager [phone]

## 2022-05-24 NOTE — Consult Note (Addendum)
Reason for Consult:Right hip fx Referring Physician: Madelyn Flavors Time called: 0730 Time at bedside: 0920   Kristin Schmidt is an 67 y.o. female.  HPI: Kristin Schmidt was going to the bathroom when she slipped and fell. She had immediate right hip pain and could not get up. She was brought to the ED where x-rays showed a hip fx and orthopedic surgery was consulted. She lives alone and does not use any assistive devices for ambulation.  Past Medical History:  Diagnosis Date   Coronary artery disease 2007   renal artery stent and subclavian artery stent - [place]   Hypertension    Pneumonia    "walking pneumonia"   Renal disorder    Tachycardia    pt was treated with Carvedilol, taken off of it in May, 2018 due to feeling fatigued.    Past Surgical History:  Procedure Laterality Date   CESAREAN SECTION     FRACTURE SURGERY Left    leg   ORIF HUMERUS FRACTURE Left 02/20/2017   Procedure: OPEN REDUCTION INTERNAL FIXATION (ORIF) LEFT PROXIMAL HUMERUS FRACTURE;  Surgeon: Francena Hanly, MD;  Location: [facility] OR;  Service: Orthopedics;  Laterality: Left;   stent placement right kidney       Family History  Problem Relation Age of Onset   COPD Mother    COPD Father    Heart attack Maternal Grandfather    Cancer Sister     Social History:  reports that she quit smoking about 6 years ago. Her smoking use included cigarettes. She has never used smokeless tobacco. She reports current alcohol use. She reports that she does not use drugs.  Allergies:  Allergies  Allergen Reactions   Penicillins Itching    HIVES    Medications: I have reviewed the patient's current medications.  Results for orders placed or performed during the hospital encounter of 05/24/22 (from the past 48 hour(s))  CBC     Status: Abnormal   Collection Time: 05/24/22  4:55 AM  Result Value Ref Range   WBC 11.5 (H) 4.0 - 10.5 K/uL   RBC 3.29 (L) 3.87 - 5.11 MIL/uL   Hemoglobin 11.1 (L) 12.0 - 15.0 g/dL   HCT  32.3 (L) 55.7 - 46.0 %   MCV 100.9 (H) 80.0 - 100.0 fL   MCH 33.7 26.0 - 34.0 pg   MCHC 33.4 30.0 - 36.0 g/dL   RDW 32.2 02.5 - 42.7 %   Platelets 297 150 - 400 K/uL   nRBC 0.0 0.0 - 0.2 %    Comment: Performed at [facility], [address], [place], [place] [address]  Comprehensive metabolic panel     Status: Abnormal   Collection Time: 05/24/22  4:55 AM  Result Value Ref Range   Sodium 135 135 - 145 mmol/L   Potassium 4.2 3.5 - 5.1 mmol/L   Chloride 104 98 - 111 mmol/L   CO2 18 (L) 22 - 32 mmol/L   Glucose, Bld 126 (H) 70 - 99 mg/dL    Comment: Glucose reference range applies only to samples taken after fasting for at least 8 hours.   BUN 23 8 - 23 mg/dL   Creatinine, Ser 6.28 (H) 0.44 - 1.00 mg/dL   Calcium 8.5 (L) 8.9 - 10.3 mg/dL   Total Protein 5.9 (L) 6.5 - 8.1 g/dL   Albumin 3.3 (L) 3.5 - 5.0 g/dL   AST 22 15 - 41 U/L   ALT 20 0 - 44 U/L   Alkaline Phosphatase  56 38 - 126 U/L   Total Bilirubin 0.5 0.3 - 1.2 mg/dL   GFR, Estimated 57 (L) >60 mL/min    Comment: (NOTE) Calculated using the CKD-EPI Creatinine Equation (2021)    Anion gap 13 5 - 15    Comment: Performed at [facility] [address] [place], [place] [address]  ABO/Rh     Status: None (Preliminary result)   Collection Time: 05/24/22  4:55 AM  Result Value Ref Range   ABO/RH(D) PENDING     DG Hip Unilat W or Wo Pelvis 2-3 Views Right  Result Date: 05/24/2022 CLINICAL DATA:  Fall onto right hip. EXAM: DG HIP (WITH OR WITHOUT PELVIS) 2-3V RIGHT COMPARISON:  None Available. FINDINGS: Comminuted intertrochanteric fracture of the proximal right femur is noted. Mild superior scratch set there is mild impaction of the fracture fragments. No additional fracture or dislocation. No signs of pelvic diastasis. IMPRESSION: Comminuted intertrochanteric fracture of the proximal right femur. Electronically Signed   By: Kerby Moors M.D.   On: 05/24/2022 05:20    Review of Systems  HENT:  Negative for  ear discharge, ear pain, hearing loss and tinnitus.   Eyes:  Negative for photophobia and pain.  Respiratory:  Negative for cough and shortness of breath.   Cardiovascular:  Negative for chest pain.  Gastrointestinal:  Negative for abdominal pain, nausea and vomiting.  Genitourinary:  Negative for dysuria, flank pain, frequency and urgency.  Musculoskeletal:  Positive for arthralgias (Right hip). Negative for back pain, myalgias and neck pain.  Neurological:  Negative for dizziness and headaches.  Hematological:  Does not bruise/bleed easily.  Psychiatric/Behavioral:  The patient is not nervous/anxious.    Blood pressure (!) 95/55, pulse 94, temperature 98.7 F (37.1 C), temperature source Oral, resp. rate 16, height 5\' 2"  (1.575 m), weight 66.2 kg, SpO2 93 %. Physical Exam Constitutional:      General: She is not in acute distress.    Appearance: She is well-developed. She is not diaphoretic.  HENT:     Head: Normocephalic and atraumatic.  Eyes:     General: No scleral icterus.       Right eye: No discharge.        Left eye: No discharge.     Conjunctiva/sclera: Conjunctivae normal.  Cardiovascular:     Rate and Rhythm: Normal rate and regular rhythm.  Pulmonary:     Effort: Pulmonary effort is normal. No respiratory distress.  Musculoskeletal:     Cervical back: Normal range of motion.     Comments: RLE No traumatic wounds, ecchymosis, or rash  Mild TTP hip  No knee or ankle effusion  Knee stable to varus/ valgus and anterior/posterior stress  Sens DPN, SPN, TN intact  Motor EHL, ext, flex, evers 5/5  DP 2+, PT 2+, No significant edema  Skin:    General: Skin is warm and dry.  Neurological:     Mental Status: She is alert.  Psychiatric:        Mood and Affect: Mood normal.        Behavior: Behavior normal.     Assessment/Plan: Right hip fx -- Plan IMN today with either Dr. Doreatha Martin or Dr. Erlinda Hong. Please keep NPO. Multiple medical problems including CAD and HTN -- per  primary service    Lisette Abu, PA-C Orthopedic Surgery [phone] 05/24/2022, 9:25 AM    Patient seen and examined and agree with the note above.  67 year old female with a right intertrochanteric femur fracture.  Due to the unstable nature of her injury I recommend proceeding with a cephalomedullary nailing of her right hip.  Risks and benefits were discussed with the patient as well as her daughter over the phone.  Risks included but not limited to bleeding, infection, malunion, nonunion, hardware failure, hardware irritation, nerve or blood vessel injury, DVT, even the possibility anesthetic complications.  She agreed to proceed with surgery and consent was obtained.  Shona Needles, MD [facility] [phone] (office) [email or web address]

## 2022-05-24 NOTE — ED Provider Notes (Signed)
[facility] Emergency Department Provider Note MRN:  [id number]  Arrival date & time: 05/24/22     Chief Complaint   Fall  History of Present Illness   Kristin Schmidt is a 67 y.o. year-old female with a history of CAD presenting to the ED with chief complaint of fall.  Stumbled while getting out of bed.  Got up and walked to bathroom.  Got disoriented when pulling off her shirt.  [place] again onto right hip.    Review of Systems  A thorough review of systems was obtained and all systems are negative except as noted in the HPI and PMH.   Patient's Health History    Past Medical History:  Diagnosis Date   Coronary artery disease 2007   renal artery stent and subclavian artery stent - [place]   Hypertension    Pneumonia    "walking pneumonia"   Renal disorder    Tachycardia    pt was treated with Carvedilol, taken off of it in May, 2018 due to feeling fatigued.    Past Surgical History:  Procedure Laterality Date   CESAREAN SECTION     FRACTURE SURGERY Left    leg   ORIF HUMERUS FRACTURE Left 02/20/2017   Procedure: OPEN REDUCTION INTERNAL FIXATION (ORIF) LEFT PROXIMAL HUMERUS FRACTURE;  Surgeon: Justice Britain, MD;  Location: [facility];  Service: Orthopedics;  Laterality: Left;   stent placement right kidney       Family History  Problem Relation Age of Onset   COPD Mother    COPD Father    Heart attack Maternal Grandfather    Cancer Sister     Social History   Socioeconomic History   Marital status: Single    Spouse name: Not on file   Number of children: Not on file   Years of education: Not on file   Highest education level: Not on file  Occupational History   Not on file  Tobacco Use   Smoking status: Former    Types: Cigarettes    Quit date: 11/20/2015    Years since quitting: 6.5   Smokeless tobacco: Never  Vaping Use   Vaping Use: Never used  Substance and Sexual Activity   Alcohol use: Yes    Comment: weekends   Drug use:  No   Sexual activity: Not on file  Other Topics Concern   Not on file  Social History Narrative   Not on file   Social Determinants of Health   Financial Resource Strain: Not on file  Food Insecurity: Not on file  Transportation Needs: Not on file  Physical Activity: Not on file  Stress: Not on file  Social Connections: Not on file  Intimate Partner Violence: Not on file     Physical Exam   Vitals:   05/24/22 0530 05/24/22 0545  BP: (!) 120/52 (!) 112/46  Pulse: 78 93  Resp: 13 17  Temp:    SpO2: 96% 96%    CONSTITUTIONAL:  well-appearing, NAD NEURO/PSYCH:  Alert and oriented x 3, no focal deficits EYES:  eyes equal and reactive ENT/NECK:  no LAD, no JVD CARDIO:  regular rate, well-perfused, normal S1 and S2 PULM:  CTAB no wheezing or rhonchi GI/GU:  non-distended, non-tender MSK/SPINE:  externally rotated and shortened RLE SKIN:  no rash, atraumatic   *Additional and/or pertinent findings included in MDM below  Diagnostic and Interventional Summary    EKG Interpretation  Date/Time:    Ventricular Rate:    PR  Interval:    QRS Duration:   QT Interval:    QTC Calculation:   R Axis:     Text Interpretation:         Labs Reviewed  CBC - Abnormal; Notable for the following components:      Result Value   WBC 11.5 (*)    RBC 3.29 (*)    Hemoglobin 11.1 (*)    HCT 33.2 (*)    MCV 100.9 (*)    All other components within normal limits  COMPREHENSIVE METABOLIC PANEL - Abnormal; Notable for the following components:   CO2 18 (*)    Glucose, Bld 126 (*)    Creatinine, Ser 1.07 (*)    Calcium 8.5 (*)    Total Protein 5.9 (*)    Albumin 3.3 (*)    GFR, Estimated 57 (*)    All other components within normal limits    DG Hip Unilat W or Wo Pelvis 2-3 Views Right  Final Result      Medications  HYDROmorphone (DILAUDID) injection 1 mg (1 mg Intravenous Given 05/24/22 0554)  sodium chloride 0.9 % bolus 500 mL (0 mLs Intravenous Stopped 05/24/22 0541)      Procedures  /  Critical Care Procedures  ED Course and Medical Decision Making  Initial Impression and Ddx Suspicious for hip fracture.  Denies head trauma or any other symptoms.  Mechanical fall.  Past medical/surgical history that increases complexity of ED encounter: CAD  Interpretation of Diagnostics I personally reviewed the hip x-ray and my interpretation is as follows: Intertrochanteric fracture  Labs reassuring with no significant blood count or electrolyte disturbance  Patient Reassessment and Ultimate Disposition/Management     Case discussed with Dr. Kathaleen Bury of emergent orthopedics, plan is for hospitalist admission.  Patient management required discussion with the following services or consulting groups:  Hospitalist Service and Orthopedic Surgery  Complexity of Problems Addressed Acute illness or injury that poses threat of life of bodily function  Additional Data Reviewed and Analyzed Further history obtained from: Prior labs/imaging results  Additional Factors Impacting ED Encounter Risk Consideration of hospitalization  [name]. [name], Beverly [email or web address]  Final Clinical Impressions(s) / ED Diagnoses     ICD-10-CM   1. Closed fracture of right hip, initial encounter [facility])  S72.001A       ED Discharge Orders     None        Discharge Instructions Discussed with and Provided to Patient:   Discharge Instructions   None      Maudie Flakes, MD 05/24/22 [phone]

## 2022-05-24 NOTE — H&P (Signed)
History and Physical    Patient: Kristin Schmidt CBJ:[id number] DOB: 06/23/1955 DOA: 05/24/2022 DOS: the patient was seen and examined on 05/24/2022 PCP: Eather Colas, FNP  Patient coming from: Home via EMS  Chief Complaint:  Chief Complaint  Patient presents with   Fall    Patient arrives via ems from home secondary to fall. Patient feel out of bed , she was able to  get up and walk to the restroom where she feel again and landed on her rt hip. Visible rt outer rotation and shortening of right leg. 100 mcg fentanyl was given prior arrival. Denies loc, no active bleeding noted.    HPI: Kristin Schmidt is a 67 y.o. female with medical history significant of hypertension, CAD, and remote tree of tobacco abuse who presents after having a fall at home.  At baseline patient ambulates without need of assistance.  Patient states that she had gotten up to use the restroom around 5 AM this morning and slipped out of bed.  She was able to get up and then went to the bathroom and while taking off her shirt fell again landing on her right hip.  Patient reported having severe pain in the right hip and was unable to get up or bear weight on the affected leg.  She denies having any lightheadedness, loss of consciousness, or trauma to her head.  She was able to crawl to the bed where her phone was and pulled all the sheets to get her phone to call for help.  Otherwise patient had been in her normal state of health.  Denies any recent fever, chills, cough, shortness of breath, chest pain, nausea, vomiting, diarrhea, dysuria, or recent sick contacts to her knowledge.  Patient reports that she quit smoking over 10 years ago, but continues to wear nicotine patch due to others smoking around her.  She drinks wine approximately 3-4 times a week drinking 2 "big glasses".  In the emergency department patient was noted to be afebrile with blood pressures as low as 96/49 improvement with IV fluids.  Labs  significant for WBC 11.5, hemoglobin 11.1, BUN 23,  and creatinine 1.7.  X-rays of the pelvis revealed a communicated intertrochanteric fracture of the proximal right femur.  Patient had been bolused 500 mL of normal saline IV fluids and given Dilaudid 1 mg IV.  [facility] had been consulted and plan on taking patient to surgery today.  Review of Systems: As mentioned in the history of present illness. All other systems reviewed and are negative. Past Medical History:  Diagnosis Date   Coronary artery disease 2007   renal artery stent and subclavian artery stent - [place]   Hypertension    Pneumonia    "walking pneumonia"   Renal disorder    Tachycardia    pt was treated with Carvedilol, taken off of it in May, 2018 due to feeling fatigued.   Past Surgical History:  Procedure Laterality Date   CESAREAN SECTION     FRACTURE SURGERY Left    leg   ORIF HUMERUS FRACTURE Left 02/20/2017   Procedure: OPEN REDUCTION INTERNAL FIXATION (ORIF) LEFT PROXIMAL HUMERUS FRACTURE;  Surgeon: Francena Hanly, MD;  Location: [facility];  Service: Orthopedics;  Laterality: Left;   stent placement right kidney      Social History:  reports that she quit smoking about 6 years ago. Her smoking use included cigarettes. She has never used smokeless tobacco. She reports current alcohol use. She reports that she does not use  drugs.  Allergies  Allergen Reactions   Penicillins Itching    HIVES    Family History  Problem Relation Age of Onset   COPD Mother    COPD Father    Heart attack Maternal Grandfather    Cancer Sister     Prior to Admission medications   Medication Sig Start Date End Date Taking? Authorizing Provider  Ascorbic Acid (VITA-C PO) Take 2 capsules by mouth daily.   Yes [provider]  Cholecalciferol (VITAMIN D3 PO) Take 1 capsule by mouth daily.   Yes [provider]  diltiazem (CARDIZEM CD) 240 MG 24 hr capsule Take 1 capsule (240 mg total) by mouth daily. Please make  overdue appt with Dr. Curt Bears before anymore refills. Thank you 1st attempt 04/04/21  Yes Camnitz, Will Hassell Done, MD  losartan (COZAAR) 100 MG tablet Take 100 mg by mouth daily.   Yes [provider]  Magnesium Oxide 250 MG TABS Take 250 mg by mouth daily.   Yes [provider]  melatonin 5 MG TABS Take 5 mg by mouth daily.   Yes [provider]  metoprolol tartrate (LOPRESSOR) 25 MG tablet TAKE 1 TABLET BY MOUTH 2 (TWO) TIMES DAILY AS NEEDED (IF TOP BLOOD PRESSURE IS OVER 160). 02/05/22  Yes Camnitz, Will Hassell Done, MD  Multiple Vitamins-Minerals (ZINC PO) Take 1 capsule by mouth daily.   Yes [provider]  nicotine (NICODERM CQ - DOSED IN MG/24 HOURS) 21 mg/24hr patch Place 21 mg onto the skin daily as needed (for smoking).   Yes [provider]  TURMERIC PO Take 1 tablet by mouth daily.   Yes [provider]  zolpidem (AMBIEN) 5 MG tablet Take 5 mg by mouth at bedtime as needed for sleep. 11/22/21  Yes [provider]    Physical Exam: Vitals:   05/24/22 0545 05/24/22 0630 05/24/22 0715 05/24/22 0730  BP: (!) 112/46 (!) 101/49 91/61 (!) 95/55  Pulse: 93 85 96 94  Resp: 17 12 14 16   Temp:      TempSrc:      SpO2: 96% 90% 93% 93%  Weight:      Height:        Constitutional: Elderly female currently in no acute distress Eyes: PERRL, lids and conjunctivae normal ENMT: Mucous membranes are moist. Posterior pharynx clear of any exudate or lesions.Normal dentition.  Neck: normal, supple, no masses Respiratory: clear to auscultation bilaterally, no wheezing, no crackles. Normal respiratory effort. No accessory muscle use.  Cardiovascular: Regular rate and rhythm, no murmurs / rubs / gallops. No extremity edema. 2+ pedal pulses. No carotid bruits.  Abdomen: no tenderness, no masses palpated. No hepatosplenomegaly. Bowel sounds positive.  Musculoskeletal: no clubbing / cyanosis.  Right leg externally rotated and mildly  shortened. Skin: no rashes, lesions, ulcers. No induration Neurologic: CN 2-12 grossly intact.  Strength 5/5 in all 4.  Psychiatric: Normal judgment and insight. Alert and oriented x 3. Normal mood.   Data Reviewed:  EKG revealed normal sinus rhythm 72 bpm with ST elevation noted in inferior leads. Assessment and Plan:  Right intertrochanteric hip fracture secondary to fall Patient reports having fallen off the bed and subsequently fell while it was in the bathroom.  X-rays revealed a communicated intertrochanteric fracture of the proximal right femur.  Orthopedics was consulted a with plans to take to surgery for IMN with Dr. Doreatha Martin or Dr. Erlinda Hong -Admit to a surgical telemetry bed  -Hip fracture order set utilized -N.p.o. for need of  procedure -Hydrocodone/morphine IV as needed for pain -Appreciate orthopedic consultative services, will follow-up for any further recommendations  Hypotension Blood pressures initially noted to be as low as 96/49.  Possibly related to pain medications of IV Dilaudid given in the ED.  Blood pressure regimen includes Cardizem 240 mg daily, losartan 100 mg daily, and has metoprolol 25 mg to take as needed for blood pressures greater than 160. -Hold home blood pressure regimen -Bolus 500 mL of IV fluids  -Continue IV fluids as needed to maintain maps greater than 65  Leukocytosis Acute.  WBC elevated 11.5.   Patient denied having any infectious symptoms prior to arrival.  Thought possibly secondary to fracture. -Recheck CBC tomorrow morning  Macrocytic anemia Acute.  Hemoglobin 11.1 on admission with elevated MCV.  Last available hemoglobin 13.6 back in 2020.  Patient did not report any complaints of bleeding. -Recheck CBC tomorrow morning  CKD stage IIIa Creatinine 1.07 with BUN 23 which appears similar to prior. -Continue to monitor  History of tobacco abuse Patient reports that she quit smoking 10 years ago, but people around her smoke for which she  wears a nicotine patch. -Continue nicotine patch  DVT prophylaxis: Lovenox Advance Care Planning:   Code Status: Full Code  Consults: Orthopedics  Severity of Illness: The appropriate patient status for this patient is INPATIENT. Inpatient status is judged to be reasonable and necessary in order to provide the required intensity of service to ensure the patient's safety. The patient's presenting symptoms, physical exam findings, and initial radiographic and laboratory data in the context of their chronic comorbidities is felt to place them at high risk for further clinical deterioration. Furthermore, it is not anticipated that the patient will be medically stable for discharge from the hospital within 2 midnights of admission.   * I certify that at the point of admission it is my clinical judgment that the patient will require inpatient hospital care spanning beyond 2 midnights from the point of admission due to high intensity of service, high risk for further deterioration and high frequency of surveillance required.*  Author: Norval Morton, MD 05/24/2022 8:07 AM  For on call review [email or web address].

## 2022-05-24 NOTE — Progress Notes (Signed)
Pt arrived to the unit at [facility] with the transport RN. Pt is very lethargic; can barely open her eyes. O2 is low 80; RR 4-8. Simple mask is applied at 10 L. RRT called. CBG is 172.

## 2022-05-24 NOTE — Interval H&P Note (Signed)
History and Physical Interval Note:  05/24/2022 2:58 PM  Kristin Schmidt  has presented today for surgery, with the diagnosis of right intertrochanteric fracture.  The various methods of treatment have been discussed with the patient and family. After consideration of risks, benefits and other options for treatment, the patient has consented to  Procedure(s): INTRAMEDULLARY (IM) NAIL INTERTROCHANTERIC (Right) as a surgical intervention.  The patient's history has been reviewed, patient examined, no change in status, stable for surgery.  I have reviewed the patient's chart and labs.  Questions were answered to the patient's satisfaction.     Lennette Bihari P Nilani Hugill

## 2022-05-24 NOTE — Transfer of Care (Signed)
Immediate Anesthesia Transfer of Care Note  Patient: Kristin Schmidt  Procedure(s) Performed: INTRAMEDULLARY NAILING OF RIGHT FEMUR (Right: Leg Upper)  Patient Location: PACU  Anesthesia Type:GA combined with regional for post-op pain  Level of Consciousness: awake, alert , and oriented  Airway & Oxygen Therapy: Patient Spontanous Breathing and Patient connected to face mask oxygen  Post-op Assessment: Report given to RN, Post -op Vital signs reviewed and stable, Patient moving all extremities X 4, and Patient able to stick tongue midline  Post vital signs: Reviewed  Last Vitals:  Vitals Value Taken Time  BP 108/47 05/24/22 1653  Temp 98.6   Pulse 78 05/24/22 1656  Resp 11 05/24/22 1656  SpO2 99 % 05/24/22 1656  Vitals shown include unvalidated device data.  Last Pain:  Vitals:   05/24/22 1515  TempSrc:   PainSc: 0-No pain      Patients Stated Pain Goal: 2 (16/10/96 0454)  Complications: No notable events documented.

## 2022-05-25 DIAGNOSIS — S728X1A Other fracture of right femur, initial encounter for closed fracture: Secondary | ICD-10-CM | POA: Diagnosis not present

## 2022-05-25 LAB — BASIC METABOLIC PANEL WITH GFR
Anion gap: 10 (ref 5–15)
BUN: 27 mg/dL — ABNORMAL HIGH (ref 8–23)
CO2: 19 mmol/L — ABNORMAL LOW (ref 22–32)
Calcium: 8.4 mg/dL — ABNORMAL LOW (ref 8.9–10.3)
Chloride: 106 mmol/L (ref 98–111)
Creatinine, Ser: 1.43 mg/dL — ABNORMAL HIGH (ref 0.44–1.00)
GFR, Estimated: 40 mL/min — ABNORMAL LOW
Glucose, Bld: 160 mg/dL — ABNORMAL HIGH (ref 70–99)
Potassium: 4.7 mmol/L (ref 3.5–5.1)
Sodium: 135 mmol/L (ref 135–145)

## 2022-05-25 LAB — CBC
HCT: 30.3 % — ABNORMAL LOW (ref 36.0–46.0)
Hemoglobin: 10 g/dL — ABNORMAL LOW (ref 12.0–15.0)
MCH: 33.9 pg (ref 26.0–34.0)
MCHC: 33 g/dL (ref 30.0–36.0)
MCV: 102.7 fL — ABNORMAL HIGH (ref 80.0–100.0)
Platelets: 252 10*3/uL (ref 150–400)
RBC: 2.95 MIL/uL — ABNORMAL LOW (ref 3.87–5.11)
RDW: 13.2 % (ref 11.5–15.5)
WBC: 10.3 10*3/uL (ref 4.0–10.5)
nRBC: 0 % (ref 0.0–0.2)

## 2022-05-25 LAB — GLUCOSE, CAPILLARY: Glucose-Capillary: 164 mg/dL — ABNORMAL HIGH (ref 70–99)

## 2022-05-25 MED ORDER — DILTIAZEM HCL ER COATED BEADS 120 MG PO CP24
120.0000 mg | ORAL_CAPSULE | Freq: Every day | ORAL | Status: DC
  Administered 2022-05-25 – 2022-05-28 (×2): 120 mg via ORAL
  Filled 2022-05-25 (×4): qty 1

## 2022-05-25 MED ORDER — ESCITALOPRAM OXALATE 10 MG PO TABS
10.0000 mg | ORAL_TABLET | Freq: Every day | ORAL | Status: DC
  Administered 2022-05-25 – 2022-05-27 (×3): 10 mg via ORAL
  Filled 2022-05-25 (×3): qty 1

## 2022-05-25 MED ORDER — ESCITALOPRAM OXALATE 10 MG PO TABS: 10.0000 mg | ORAL_TABLET | Freq: Every day | ORAL | Status: DC

## 2022-05-25 NOTE — Progress Notes (Signed)
Orthopaedic Trauma Progress Note  S: Awake and alert this AM doing well. No specific complaints. No family at bedside. Pain controlled  O:  Vitals:   05/24/22 1913 05/24/22 2000  BP:  98/60  Pulse: (!) 125 (!) 103  Resp: 12 (!) 9  Temp:    SpO2: 93% 97%    RLE: Dressings clean and dry. Thigh soft and compressible. Motor and sensory intact to foot. Gentle motion is tolerable  Imaging: Stable postop imaging  Labs:  Results for orders placed or performed during the hospital encounter of 05/24/22 (from the past 24 hour(s))  HIV Antibody (routine testing w rflx)     Status: None   Collection Time: 05/24/22  9:15 AM  Result Value Ref Range   HIV Screen 4th Generation wRfx Non Reactive Non Reactive  Type and screen [place]     Status: None   Collection Time: 05/24/22  9:15 AM  Result Value Ref Range   ABO/RH(D) A POS    Antibody Screen NEG    Sample Expiration      05/27/2022,2359 Performed at [facility], [place] [address], [place], [place] [address]   Surgical pcr screen     Status: None   Collection Time: 05/24/22  2:56 PM   Specimen: Nasal Mucosa; Nasal Swab  Result Value Ref Range   MRSA, PCR NEGATIVE NEGATIVE   Staphylococcus aureus NEGATIVE NEGATIVE  Glucose, capillary     Status: Abnormal   Collection Time: 05/24/22  6:51 PM  Result Value Ref Range   Glucose-Capillary 172 (H) 70 - 99 mg/dL  VITAMIN D 25 Hydroxy (Vit-D Deficiency, Fractures)     Status: None   Collection Time: 05/24/22  7:58 PM  Result Value Ref Range   Vit D, 25-Hydroxy 91.85 30 - 100 ng/mL  Basic metabolic panel     Status: Abnormal   Collection Time: 05/25/22  3:46 AM  Result Value Ref Range   Sodium 135 135 - 145 mmol/L   Potassium 4.7 3.5 - 5.1 mmol/L   Chloride 106 98 - 111 mmol/L   CO2 19 (L) 22 - 32 mmol/L   Glucose, Bld 160 (H) 70 - 99 mg/dL   BUN 27 (H) 8 - 23 mg/dL   Creatinine, Ser 1.43 (H) 0.44 - 1.00 mg/dL   Calcium 8.4 (L) 8.9 - 10.3 mg/dL   GFR,  Estimated 40 (L) >60 mL/min   Anion gap 10 5 - 15  CBC     Status: Abnormal   Collection Time: 05/25/22  3:46 AM  Result Value Ref Range   WBC 10.3 4.0 - 10.5 K/uL   RBC 2.95 (L) 3.87 - 5.11 MIL/uL   Hemoglobin 10.0 (L) 12.0 - 15.0 g/dL   HCT 30.3 (L) 36.0 - 46.0 %   MCV 102.7 (H) 80.0 - 100.0 fL   MCH 33.9 26.0 - 34.0 pg   MCHC 33.0 30.0 - 36.0 g/dL   RDW 13.2 11.5 - 15.5 %   Platelets 252 150 - 400 K/uL   nRBC 0.0 0.0 - 0.2 %    Assessment: 67 year old female s/p cephalomedullary nailing of right hip  Weightbearing: WBAT RLE  Insicional and dressing care:  Will change tomorrow  Orthopedic device(s):None needed  CV/Blood loss:Hgb 10.0, stable, continue to monitor. Does have tachycardia this AM, has history of this in past post anesthesia  Pain management: Tylenol PRN, Norco 7.5mg  PRN, robaxin PRN, morphine IV 0.5-1mg  PRN; attempt to limit narcotics with rapid  response last night.  VTE prophylaxis: Lovenox to start today, transitition to [facility] upon discharge  ID: Ancef for surgical prophylaxis  Dispo: PT/OT, likely SNF upon discharge  Follow - up plan: 2 weeks post discharge for x-rays  Shona Needles, MD [facility] [phone] (office) [email or web address]

## 2022-05-25 NOTE — Evaluation (Addendum)
Physical Therapy Evaluation Patient Details Name: Kristin Schmidt MRN: [id number] DOB: 1955-08-09 Today's Date: 05/25/2022  History of Present Illness  Pt is 67 yo female who sustained a fall and is currently post op cephalomedullary nailing of the right intertrochanteric femur due to fracture.  Postoperatively, upon returning to the floor, rapid response was called due to hypotension and unresponsiveness, responded well to Narcan. PMH: CAD, HTN, renal disorder, Tachycardia  Clinical Impression  Pt is demonstrating below prior level of function. Previously pt was independent with all activities. Currently pt requires Min A for sit to stand and was only able to ambulate 12 feet before she fatigued and was limited by nausea/vomiting. Pt is motivated and was high level prior to hospitalization. Due to current functional status, level of assistance available at home and home set up recommending skilled physical therapy services in SNF setting on discharge from acute care hospital setting in order to decrease risk for falls, injury and re-hospitalization. Pt HR up to 131 during session, reported nausea during gait BP 145/65.        Recommendations for follow up therapy are one component of a multi-disciplinary discharge planning process, led by the attending physician.  Recommendations may be updated based on patient status, additional functional criteria and insurance authorization.  Follow Up Recommendations Skilled nursing-short term rehab (<3 hours/day) Can patient physically be transported by private vehicle: Yes    Assistance Recommended at Discharge Frequent or constant Supervision/Assistance  Patient can return home with the following  A little help with walking and/or transfers;Assistance with cooking/housework;Help with stairs or ramp for entrance;Assist for transportation    Equipment Recommendations None recommended by PT  Recommendations for Other Services       Functional  Status Assessment Patient has had a recent decline in their functional status and demonstrates the ability to make significant improvements in function in a reasonable and predictable amount of time.     Precautions / Restrictions Precautions Precautions: Fall Restrictions Weight Bearing Restrictions: Yes RLE Weight Bearing: Weight bearing as tolerated      Mobility  Bed Mobility               General bed mobility comments: See OT note Patient Response: Cooperative  Transfers Overall transfer level: Needs assistance Equipment used: Rolling walker (2 wheels) Transfers: Sit to/from Stand Sit to Stand: Min assist           General transfer comment: Min A due to pain/weakness.    Ambulation/Gait Ambulation/Gait assistance: Min assist Gait Distance (Feet): 12 Feet Assistive device: Rolling walker (2 wheels) Gait Pattern/deviations: Step-to pattern, Decreased stance time - right   Gait velocity interpretation: <1.31 ft/sec, indicative of household ambulator   General Gait Details: decreased cadence, limited by nausea/dizziness. BP WNL, HR up to 131 bpm with activity  Stairs Stairs:  (did not attempt step today. Pt has one step but pt and daughter state she can walk around it if needed.)          Wheelchair Mobility    Modified Rankin (Stroke Patients Only)       Balance Overall balance assessment: Needs assistance, History of Falls Sitting-balance support: No upper extremity supported, Single extremity supported Sitting balance-Leahy Scale: Good     Standing balance support: Bilateral upper extremity supported, Reliant on assistive device for balance, During functional activity Standing balance-Leahy Scale: Fair Standing balance comment: Min A for balance during functional activities         Pertinent Vitals/Pain Pain Assessment Pain Assessment:  0-10 Pain Score: 4  Pain Descriptors / Indicators: Aching, Other (Comment) (stiff) Pain  Intervention(s): Limited activity within patient's tolerance, Monitored during session    Home Living Family/patient expects to be discharged to:: Private residence Living Arrangements: Alone Available Help at Discharge: Available PRN/intermittently Type of Home: Apartment Home Access: Stairs to enter   [employer] of Steps: 1   Home Layout: One level Home Equipment: Grab bars - tub/shower      Prior Function Prior Level of Function : Driving;Independent/Modified Independent         Hand Dominance   Dominant Hand: Right    Extremity/Trunk Assessment   Upper Extremity Assessment Upper Extremity Assessment: Defer to OT evaluation    Lower Extremity Assessment Lower Extremity Assessment: RLE deficits/detail RLE Deficits / Details: R cephalomedularry nailing    Cervical / Trunk Assessment Cervical / Trunk Assessment: Normal  Communication   Communication: No difficulties  Cognition Arousal/Alertness: Awake/alert Behavior During Therapy: Anxious Overall Cognitive Status: Within Functional Limits for tasks assessed          General Comments General comments (skin integrity, edema, etc.): bruised R toe        Assessment/Plan    PT Assessment Patient needs continued PT services  PT Problem List Decreased strength;Decreased mobility;Decreased activity tolerance;Decreased balance;Pain       PT Treatment Interventions DME instruction;Therapeutic exercise;Gait training;Balance training;Stair training;Neuromuscular re-education;Manual techniques;Functional mobility training;Therapeutic activities;Patient/family education    PT Goals (Current goals can be found in the Care Plan section)  Acute Rehab PT Goals Patient Stated Goal: To get stronger PT Goal Formulation: With patient Time For Goal Achievement: 06/08/22 Potential to Achieve Goals: Good    Frequency Min 4X/week        AM-PAC PT "6 Clicks" Mobility  Outcome Measure Help needed turning  from your back to your side while in a flat bed without using bedrails?: A Little Help needed moving from lying on your back to sitting on the side of a flat bed without using bedrails?: A Little Help needed moving to and from a bed to a chair (including a wheelchair)?: A Little Help needed standing up from a chair using your arms (e.g., wheelchair or bedside chair)?: A Little Help needed to walk in hospital room?: A Little Help needed climbing 3-5 steps with a railing? : A Little 6 Click Score: 18    End of Session Equipment Utilized During Treatment: Gait belt Activity Tolerance: Patient tolerated treatment well Patient left: with call bell/phone within reach;in chair;with chair alarm set Nurse Communication: Mobility status PT Visit Diagnosis: Unsteadiness on feet (R26.81);Other abnormalities of gait and mobility (R26.89)    Time: 6244-6950 PT Time Calculation (min) (ACUTE ONLY): 30 min   Charges:   PT Evaluation $PT Eval Low Complexity: 1 Low PT Treatments $Therapeutic Activity: 8-22 mins       Tomma Rakers, DPT, CLT  Acute Rehabilitation Services Office: [phone] (Secure chat preferred)   Ander Purpura 05/25/2022, 3:07 PM

## 2022-05-25 NOTE — Plan of Care (Signed)
  Problem: Activity: Goal: Risk for activity intolerance will decrease Outcome: Progressing   Problem: Nutrition: Goal: Adequate nutrition will be maintained Outcome: Progressing   Problem: Pain Managment: Goal: General experience of comfort will improve Outcome: Progressing   Problem: Safety: Goal: Ability to remain free from injury will improve Outcome: Progressing   

## 2022-05-25 NOTE — Evaluation (Signed)
Occupational Therapy Evaluation Patient Details Name: Kristin Schmidt MRN: [id number] DOB: 1956-03-16 Today's Date: 05/25/2022   History of Present Illness Pt is 66 yo female who sustained a fall and is currently post op cephalomedullary nailing of the right intertrochanteric femur due to fracture.  Postoperatively, upon returning to the floor, rapid response was called due to hypotension and unresponsiveness, responded well to Narcan. PMH: CAD, HTN, renal disorder, Tachycardia   Clinical Impression   PTA pt lives alone independently; recently retired from being an Dispensing optician". Pt anxious about mobilizing however able to progress OOB to chair and take several steps with min A @ RW level. Requires mod A with ADL tasks due to below listed deficits. VSS with  the exception of HR in the 130s during mobility. Feel pt would benefit from intensive rehab at [facility] to reach modified independent level goals in order to DC home with support of daughter, who is a Marine scientist at [employer]. Acute OT to follow.      Recommendations for follow up therapy are one component of a multi-disciplinary discharge planning process, led by the attending physician.  Recommendations may be updated based on patient status, additional functional criteria and insurance authorization.   Follow Up Recommendations  Acute inpatient rehab (3hours/day)     Assistance Recommended at Discharge Intermittent Supervision/Assistance  Patient can return home with the following A little help with walking and/or transfers;A little help with bathing/dressing/bathroom;Assistance with cooking/housework;Assist for transportation;Help with stairs or ramp for entrance    Functional Status Assessment  Patient has had a recent decline in their functional status and demonstrates the ability to make significant improvements in function in a reasonable and predictable amount of time.  Equipment Recommendations  BSC/3in1    Recommendations for Other  Services Rehab consult     Precautions / Restrictions Precautions Precautions: Fall Restrictions RLE Weight Bearing: Weight bearing as tolerated      Mobility Bed Mobility Overal bed mobility: Needs Assistance Bed Mobility: Supine to Sit     Supine to sit: Mod assist  Mod vc for problem solving        Transfers Overall transfer level: Needs assistance Equipment used: Rolling walker (2 wheels) Transfers: Sit to/from Stand, Bed to chair/wheelchair/BSC Sit to Stand: Min assist     Step pivot transfers: Min assist            Balance Overall balance assessment: Needs assistance, History of Falls   Sitting balance-Leahy Scale: Good       Standing balance-Leahy Scale: Poor                             ADL either performed or assessed with clinical judgement   ADL Overall ADL's : Needs assistance/impaired Eating/Feeding: Set up   Grooming: Set up;Sitting   Upper Body Bathing: Set up;Sitting   Lower Body Bathing: Moderate assistance;Sit to/from stand   Upper Body Dressing : Set up;Sitting;Supervision/safety   Lower Body Dressing: Moderate assistance;Sit to/from stand   Toilet Transfer: Minimal assistance;Ambulation   Toileting- Clothing Manipulation and Hygiene: Moderate assistance       Functional mobility during ADLs: Minimal assistance;Rolling walker (2 wheels);Cueing for safety General ADL Comments: may benefit from using AE - unable to complete figure four positioning or reach R foot     Vision Baseline Vision/History: 1 Wears glasses Additional Comments: reading glasses     Perception     Praxis      Pertinent Vitals/Pain Pain  Assessment Pain Assessment: Faces Faces Pain Scale: Hurts even more Pain Location: R hip Pain Descriptors / Indicators: Aching, Crying, Guarding Pain Intervention(s): Limited activity within patient's tolerance, Premedicated before session, Repositioned     Hand Dominance Right   Extremity/Trunk  Assessment Upper Extremity Assessment Upper Extremity Assessment: Overall WFL for tasks assessed (hx of L shoulder injury)   Lower Extremity Assessment Lower Extremity Assessment: RLE deficits/detail   Cervical / Trunk Assessment Cervical / Trunk Assessment: Normal   Communication     Cognition Arousal/Alertness: Awake/alert Behavior During Therapy: Anxious Overall Cognitive Status: Within Functional Limits for tasks assessed; slow processing - ? Related to meds                                 General Comments: will further assess cognition     General Comments  bruised R toe - 2nd - no increase in pain noted with weight bearing    Exercises     Shoulder Instructions      Home Living Family/patient expects to be discharged to:: Private residence Living Arrangements: Alone Available Help at Discharge: Available PRN/intermittently Type of Home: Apartment Home Access: Stairs to enter Entrance Stairs-Number of Steps: 1   Home Layout: One level     Bathroom Shower/Tub: IT trainer: Handicapped height Bathroom Accessibility:  (unsure)   Home Equipment: Grab bars - tub/shower          Prior Functioning/Environment Prior Level of Function : Driving;Independent/Modified Independent (enjoys computer games; paying solitaire; 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)                        OT Problem List: Decreased strength;Decreased range of motion;Decreased activity tolerance;Impaired balance (sitting and/or standing);Decreased safety awareness;Decreased knowledge of use of DME or AE;Decreased knowledge of precautions;Pain      OT Treatment/Interventions: Self-care/ADL training;Therapeutic exercise;DME and/or AE instruction;Therapeutic activities;Patient/family education;Balance training    OT Goals(Current goals can be found in the care plan section) Acute Rehab OT Goals Patient Stated Goal: to get rehab OT Goal  Formulation: With patient/family Time For Goal Achievement: 06/07/22 Potential to Achieve Goals: Good  OT Frequency: Min 2X/week    Co-evaluation              AM-PAC OT "6 Clicks" Daily Activity     Outcome Measure Help from another person eating meals?: None Help from another person taking care of personal grooming?: A Little Help from another person toileting, which includes using toliet, bedpan, or urinal?: A Lot Help from another person bathing (including washing, rinsing, drying)?: A Lot Help from another person to put on and taking off regular upper body clothing?: A Little Help from another person to put on and taking off regular lower body clothing?: A Lot 6 Click Score: 16   End of Session Equipment Utilized During Treatment: Gait belt;Rolling walker (2 wheels) Nurse Communication: Mobility status  Activity Tolerance: Patient tolerated treatment well Patient left: in chair;with call bell/phone within reach;with chair alarm set;with family/visitor present  OT Visit Diagnosis: Unsteadiness on feet (R26.81);Other abnormalities of gait and mobility (R26.89);History of falling (Z91.81);Pain Pain - Right/Left: Right Pain - part of body: Hip                Time: 6195-0932 OT Time Calculation (min): 32 min Charges:  OT General Charges $OT Visit: 1 Visit OT Evaluation $OT Eval Moderate  Complexity: 1 Mod OT Treatments $Self Care/Home Management : 8-22 mins  Maurie Boettcher, OT/L   Acute OT Clinical Specialist Acute Rehabilitation Services Pager [phone] Office [phone]   [facility] 05/25/2022, 1:06 PM

## 2022-05-25 NOTE — Anesthesia Postprocedure Evaluation (Signed)
Anesthesia Post Note  Patient: Kristin Schmidt  Procedure(s) Performed: INTRAMEDULLARY NAILING OF RIGHT FEMUR (Right: Leg Upper)     Patient location during evaluation: PACU Anesthesia Type: General Level of consciousness: awake and alert Pain management: pain level controlled Vital Signs Assessment: post-procedure vital signs reviewed and stable Respiratory status: spontaneous breathing, nonlabored ventilation, respiratory function stable and patient connected to nasal cannula oxygen Cardiovascular status: blood pressure returned to baseline and stable Postop Assessment: no apparent nausea or vomiting Anesthetic complications: no   No notable events documented.  Last Vitals:  Vitals:   05/24/22 2000 05/25/22 0830  BP: 98/60 (!) 122/53  Pulse: (!) 103 90  Resp: (!) 9 20  Temp:  36.6 C  SpO2: 97% 94%    Last Pain:  Vitals:   05/25/22 0830  TempSrc: Oral  PainSc:                  [name]

## 2022-05-25 NOTE — Progress Notes (Signed)
Inpatient Rehab Admissions Coordinator:   Per therapy recommendations, Pt  was screened for CIR candidacy by Clemens Catholic, MS, CCC-SLP. At this time, Pt. does not appear to demonstrate medical necessity to justify in hospital rehabilitation/CIR. Additionally, I do not think insurance will approve for this diagnosis. I will not pursue a rehab consult for this Pt.   Recommend other rehab venues to be pursued.  Please contact me with any questions.  Clemens Catholic, [place], [place] Admissions Coordinator  [phone] ([place]) [phone] (office)

## 2022-05-25 NOTE — Progress Notes (Signed)
PROGRESS NOTE  Kristin Schmidt SAY:[id number] DOB: 12/29/1955 DOA: 05/24/2022 PCP: Eather Colas, FNP   LOS: 1 day   Brief Narrative / Interim history: 67 year old female with HTN, CAD, remote tobacco use comes into the hospital after ground-level fall.  She is independent at baseline.  She woke up to go to the bathroom in the morning and had 2 falls.  Imaging in the ER showed a comminuted intertrochanteric fracture of the proximal right femur.  Orthopedic surgery was consulted, and she is status post cephalomedullary nailing of the right intertrochanteric femur fracture by Dr. Jena Gauss on 1/19.  Postoperatively, upon returning to the floor, rapid response was called due to hypotension and unresponsiveness, responded well to Narcan.  She was also tachycardic with a heart rate into the 140s.  Subjective / 24h Interval events: She is doing well this morning, some pain but not a whole lot.  Denies any chest discomfort, no palpitations, no shortness of breath.  Assesement and Plan: Principal Problem:   Other fracture of right femur, initial encounter for closed fracture (HCC) Active Problems:   Hypotension   Leukocytosis   Macrocytic anemia   CKD (chronic kidney disease), stage III (HCC)   History of tobacco abuse  Principal problem Right intertrochanteric hip fracture -due to mechanical fall, no syncope or loss of consciousness reported. She is status post cephalomedullary nailing of the right intertrochanteric femur fracture by Dr. Jena Gauss on 1/19.  Has been placed on Lovenox for DVT prophylaxis, continue.  She will work with physical therapy today  Active problems History of atrial tachycardia, hypotension -she was to be hypotensive in the ED and again last night, felt to be related to IV pain medications.  She was also tachycardic into the 140s.  Telemetry reviewed, showed atrial tachycardia.  She is seeing EP as an outpatient for history of the same.  No A-fib noted.  Resume home  diltiazem this morning, half of the home dose and closely monitor.  Blood pressure better today.  Elevated WBC-reactive due to hip fracture.  White count normalized this morning  AKI on CKD 3A-baseline creatinine around 1.0, elevated to 1.43 this morning, likely in the setting of surgical stress and hypotension noted yesterday.  She is normotensive this morning.  Closely monitor.  Anemia -stable, monitor, no bleeding   History of tobacco abuse -Patient reports that she quit smoking 10 years ago, but people around her smoke for which she wears a nicotine patch (?). -Continue nicotine patch  Scheduled Meds:  diltiazem  120 mg Oral Daily   docusate sodium  100 mg Oral BID   enoxaparin (LOVENOX) injection  40 mg Subcutaneous Q24H   escitalopram  10 mg Oral QHS   folic acid  1 mg Oral Daily   melatonin  5 mg Oral QHS   multivitamin with minerals  1 tablet Oral Daily   senna-docusate  1 tablet Oral QHS   thiamine  100 mg Oral Daily   Or   thiamine  100 mg Intravenous Daily   Continuous Infusions:  sodium chloride Stopped (05/25/22 1047)    ceFAZolin (ANCEF) IV 2 g (05/25/22 0626)   methocarbamol (ROBAXIN) IV     PRN Meds:.acetaminophen, HYDROcodone-acetaminophen, HYDROcodone-acetaminophen, LORazepam, methocarbamol **OR** methocarbamol (ROBAXIN) IV, metoCLOPramide **OR** metoCLOPramide (REGLAN) injection, morphine injection, naloxone, nicotine, ondansetron (ZOFRAN) IV, polyethylene glycol, zolpidem  Current Outpatient Medications  Medication Instructions   Ascorbic Acid (VITA-C PO) 2 capsules, Oral, Daily   Cholecalciferol (VITAMIN D3 PO) 1 capsule, Oral, Daily   diltiazem (  CARDIZEM CD) 240 mg, Oral, Daily, Please make overdue appt with Dr. Curt Bears before anymore refills. Thank you 1st attempt   losartan (COZAAR) 100 mg, Oral, Daily   Magnesium Oxide 250 mg, Oral, Daily   melatonin 5 mg, Oral, Daily   metoprolol tartrate (LOPRESSOR) 25 MG tablet TAKE 1 TABLET BY MOUTH 2 (TWO) TIMES  DAILY AS NEEDED (IF TOP BLOOD PRESSURE IS OVER 160).   Multiple Vitamins-Minerals (ZINC PO) 1 capsule, Oral, Daily   nicotine (NICODERM CQ - DOSED IN MG/24 HOURS) 21 mg, Transdermal, Daily PRN   TURMERIC PO 1 tablet, Oral, Daily   zolpidem (AMBIEN) 5 mg, Oral, At bedtime PRN    Diet Orders (From admission, onward)     Start     Ordered   05/24/22 1937  Diet Heart Room service appropriate? Yes; Fluid consistency: Thin  Diet effective now       Question Answer Comment  Room service appropriate? Yes   Fluid consistency: Thin      05/24/22 1937            DVT prophylaxis: enoxaparin (LOVENOX) injection 40 mg Start: 05/25/22 1000 SCDs Start: 05/24/22 1937   Lab Results  Component Value Date   PLT 252 05/25/2022      Code Status: Full Code  Family Communication: no family at bedside   Status is: Inpatient  Remains inpatient appropriate because: severity of illness  Level of care: Telemetry Surgical  Consultants:  Orthopedic surgery   Objective: Vitals:   05/24/22 1900 05/24/22 1913 05/24/22 2000 05/25/22 0830  BP: 128/69  98/60 (!) 122/53  Pulse: (!) 116 (!) 125 (!) 103 90  Resp: 10 12 (!) 9 20  Temp:    97.8 F (36.6 C)  TempSrc:    Oral  SpO2: 96% 93% 97% 94%  Weight:      Height:        Intake/Output Summary (Last 24 hours) at 05/25/2022 1116 Last data filed at 05/24/2022 1650 Gross per 24 hour  Intake 500 ml  Output 650 ml  Net -150 ml   Wt Readings from Last 3 Encounters:  05/24/22 66.7 kg  05/21/22 67.6 kg  12/28/21 67.1 kg    Examination:  Constitutional: NAD Eyes: no scleral icterus ENMT: Mucous membranes are moist.  Neck: normal, supple Respiratory: clear to auscultation bilaterally, no wheezing, no crackles. Normal respiratory effort. No accessory muscle use.  Cardiovascular: Regular rate and rhythm, no murmurs / rubs / gallops. No LE edema.  Abdomen: non distended, no tenderness. Bowel sounds positive.  Musculoskeletal: no clubbing /  cyanosis.    Data Reviewed: I have independently reviewed following labs and imaging studies   CBC Recent Labs  Lab 05/24/22 0455 05/25/22 0346  WBC 11.5* 10.3  HGB 11.1* 10.0*  HCT 33.2* 30.3*  PLT 297 252  MCV 100.9* 102.7*  MCH 33.7 33.9  MCHC 33.4 33.0  RDW 13.1 13.2    Recent Labs  Lab 05/24/22 0455 05/25/22 0346  NA 135 135  K 4.2 4.7  CL 104 106  CO2 18* 19*  GLUCOSE 126* 160*  BUN 23 27*  CREATININE 1.07* 1.43*  CALCIUM 8.5* 8.4*  AST 22  --   ALT 20  --   ALKPHOS 56  --   BILITOT 0.5  --   ALBUMIN 3.3*  --     ------------------------------------------------------------------------------------------------------------------ No results for input(s): "CHOL", "HDL", "LDLCALC", "TRIG", "CHOLHDL", "LDLDIRECT" in the last 72 hours.  No results found for: "HGBA1C" ------------------------------------------------------------------------------------------------------------------ No  results for input(s): "TSH", "T4TOTAL", "T3FREE", "THYROIDAB" in the last 72 hours.  Invalid input(s): "FREET3"  Cardiac Enzymes No results for input(s): "CKMB", "TROPONINI", "MYOGLOBIN" in the last 168 hours.  Invalid input(s): "CK" ------------------------------------------------------------------------------------------------------------------ No results found for: "BNP"  CBG: Recent Labs  Lab 05/24/22 1851 05/25/22 0829  GLUCAP 172* 164*    Recent Results (from the past 240 hour(s))  Surgical pcr screen     Status: None   Collection Time: 05/24/22  2:56 PM   Specimen: Nasal Mucosa; Nasal Swab  Result Value Ref Range Status   MRSA, PCR NEGATIVE NEGATIVE Final   Staphylococcus aureus NEGATIVE NEGATIVE Final    Comment: (NOTE) The Xpert SA Assay (FDA approved for NASAL specimens in patients 10 years of age and older), is one component of a comprehensive surveillance program. It is not intended to diagnose infection nor to guide or monitor treatment. Performed at  [facility], [place] [address], [place], [place] [address]      Radiology Studies: DG HIP PORT [facility] OR W/O PELVIS 1V RIGHT  Result Date: 05/24/2022 CLINICAL DATA:  ORIF right hip fracture.  Postoperative. EXAM: DG HIP (WITH OR WITHOUT PELVIS) 1V PORT RIGHT COMPARISON:  Pelvis and right hip radiographs 05/24/2022 at 5:04 a.m. FINDINGS: There is diffuse decreased bone mineralization. Interval cephalomedullary nail fixation of the previously seen proximal right femoral intertrochanteric fracture. Mild 5 mm medial cortical step-off. No evidence of hardware failure. Expected postoperative subcutaneous air lateral to the right hip. Mild superior left femoroacetabular joint space narrowing. IMPRESSION: Interval cephalomedullary nail fixation of the previously seen proximal right femoral intertrochanteric fracture. Electronically Signed   By: Yvonne Kendall M.D.   On: 05/24/2022 18:14   DG FEMUR, MIN 2 VIEWS RIGHT  Result Date: 05/24/2022 CLINICAL DATA:  Intramedullary nailing of right femur. Intraoperative fluoroscopy. EXAM: RIGHT FEMUR 2 VIEWS COMPARISON:  Pelvis and right hip radiographs 05/24/2022 FINDINGS: Images were performed intraoperatively without the presence of a radiologist. New placement of a right cephalomedullary nail fixating the previously seen intertrochanteric fracture. No hardware complication is seen. Total fluoroscopy images: 5 Total fluoroscopy time: 77 seconds Total dose: Radiation Exposure Index (as provided by the fluoroscopic device): 4.86 mGy air Kerma Please see intraoperative findings for further detail. IMPRESSION: Intraoperative fluoroscopy provided for right cephalomedullary nail fixating the previously seen intertrochanteric fracture. Electronically Signed   By: Yvonne Kendall M.D.   On: 05/24/2022 16:50   DG C-Arm 1-60 Min-No Report  Result Date: 05/24/2022 Fluoroscopy was utilized by the requesting physician.  No radiographic interpretation.     Marzetta Board,  MD, PhD [facility]  Between 7 am - 7 pm I am available, please contact me via Amion (for emergencies) or Securechat (non urgent messages)  Between 7 pm - 7 am I am not available, please contact night coverage MD/APP via Amion

## 2022-05-26 DIAGNOSIS — S728X1A Other fracture of right femur, initial encounter for closed fracture: Secondary | ICD-10-CM | POA: Diagnosis not present

## 2022-05-26 LAB — COMPREHENSIVE METABOLIC PANEL
ALT: 11 U/L (ref 0–44)
AST: 34 U/L (ref 15–41)
Albumin: 3.1 g/dL — ABNORMAL LOW (ref 3.5–5.0)
Alkaline Phosphatase: 42 U/L (ref 38–126)
Anion gap: 12 (ref 5–15)
BUN: 33 mg/dL — ABNORMAL HIGH (ref 8–23)
CO2: 20 mmol/L — ABNORMAL LOW (ref 22–32)
Calcium: 8.7 mg/dL — ABNORMAL LOW (ref 8.9–10.3)
Chloride: 103 mmol/L (ref 98–111)
Creatinine, Ser: 1.29 mg/dL — ABNORMAL HIGH (ref 0.44–1.00)
GFR, Estimated: 45 mL/min — ABNORMAL LOW (ref >60)
Glucose, Bld: 119 mg/dL — ABNORMAL HIGH (ref 70–99)
Potassium: 4.2 mmol/L (ref 3.5–5.1)
Sodium: 135 mmol/L (ref 135–145)
Total Bilirubin: 0.5 mg/dL (ref 0.3–1.2)
Total Protein: 5.9 g/dL — ABNORMAL LOW (ref 6.5–8.1)

## 2022-05-26 LAB — MAGNESIUM: Magnesium: 2.2 mg/dL (ref 1.7–2.4)

## 2022-05-26 LAB — CBC
HCT: 27.3 % — ABNORMAL LOW (ref 36.0–46.0)
Hemoglobin: 9 g/dL — ABNORMAL LOW (ref 12.0–15.0)
MCH: 33.7 pg (ref 26.0–34.0)
MCHC: 33 g/dL (ref 30.0–36.0)
MCV: 102.2 fL — ABNORMAL HIGH (ref 80.0–100.0)
Platelets: 217 10*3/uL (ref 150–400)
RBC: 2.67 MIL/uL — ABNORMAL LOW (ref 3.87–5.11)
RDW: 13.1 % (ref 11.5–15.5)
WBC: 13.2 10*3/uL — ABNORMAL HIGH (ref 4.0–10.5)
nRBC: 0 % (ref 0.0–0.2)

## 2022-05-26 NOTE — Plan of Care (Signed)

## 2022-05-26 NOTE — Progress Notes (Signed)
PROGRESS NOTE  Kristin Schmidt FAO:[id number] DOB: Jan 29, 1956 DOA: 05/24/2022 PCP: Eather Colas, FNP   LOS: 2 days   Brief Narrative / Interim history: 67 year old female with HTN, CAD, remote tobacco use comes into the hospital after ground-level fall.  She is independent at baseline.  She woke up to go to the bathroom in the morning and had 2 falls.  Imaging in the ER showed a comminuted intertrochanteric fracture of the proximal right femur.  Orthopedic surgery was consulted, and she is status post cephalomedullary nailing of the right intertrochanteric femur fracture by Dr. Jena Gauss on 1/19.  Postoperatively, upon returning to the floor, rapid response was called due to hypotension and unresponsiveness, responded well to Narcan.  She was also tachycardic with a heart rate into the 140s.  Subjective / 24h Interval events: Sitting in the chair, daughter at bedside.  She feels overall well.  Complains of some nausea with pain medications  Assesement and Plan: Principal Problem:   Other fracture of right femur, initial encounter for closed fracture (HCC) Active Problems:   Hypotension   Leukocytosis   Macrocytic anemia   CKD (chronic kidney disease), stage III (HCC)   History of tobacco abuse  Principal problem Right intertrochanteric hip fracture -due to mechanical fall, no syncope or loss of consciousness reported. She is status post cephalomedullary nailing of the right intertrochanteric femur fracture by Dr. Jena Gauss on 1/19.  Has been placed on Lovenox for DVT prophylaxis, continue.  PT recommend CIR, however AC for CIR notes that patient does not appear to demonstrate medical necessity.  Pursue SNF  Active problems History of atrial tachycardia, hypotension -she was to be hypotensive in the ED and again last night, felt to be related to IV pain medications.  She was also tachycardic into the 140s.  Telemetry reviewed, showed atrial tachycardia.  She is seeing EP as an outpatient  for history of the same.  No A-fib noted.  Continue home diltiazem but have to dose due to soft blood pressures postoperatively  Elevated WBC-reactive due to hip fracture.  Overall better  AKI on CKD 3A-baseline creatinine around 1.0, elevated to 1.43 this morning, likely in the setting of surgical stress and hypotension noted the day of surgery.  Creatinine better today at 1.29  Anemia, postoperative acute blood loss anemia-stable, monitor, no bleeding.  Hemoglobin down to 9.0, no need for transfusions   History of tobacco abuse -Patient reports that she quit smoking 10 years ago, but people around her smoke for which she wears a nicotine patch (?). -Continue nicotine patch  Scheduled Meds:  diltiazem  120 mg Oral Daily   docusate sodium  100 mg Oral BID   enoxaparin (LOVENOX) injection  40 mg Subcutaneous Q24H   escitalopram  10 mg Oral QHS   folic acid  1 mg Oral Daily   melatonin  5 mg Oral QHS   multivitamin with minerals  1 tablet Oral Daily   senna-docusate  1 tablet Oral QHS   thiamine  100 mg Oral Daily   Or   thiamine  100 mg Intravenous Daily   Continuous Infusions:  sodium chloride Stopped (05/25/22 1047)   methocarbamol (ROBAXIN) IV     PRN Meds:.acetaminophen, HYDROcodone-acetaminophen, HYDROcodone-acetaminophen, LORazepam, methocarbamol **OR** methocarbamol (ROBAXIN) IV, metoCLOPramide **OR** metoCLOPramide (REGLAN) injection, morphine injection, naloxone, nicotine, ondansetron (ZOFRAN) IV, polyethylene glycol, zolpidem  Current Outpatient Medications  Medication Instructions   Ascorbic Acid (VITA-C PO) 2 capsules, Oral, Daily   Cholecalciferol (VITAMIN D3 PO) 1 capsule, Oral,  Daily   diltiazem (CARDIZEM CD) 240 mg, Oral, Daily, Please make overdue appt with Dr. Curt Bears before anymore refills. Thank you 1st attempt   escitalopram (LEXAPRO) 10 mg, Oral, Daily at bedtime   ibuprofen (ADVIL) 200 mg, Oral, Every 6 hours PRN   losartan (COZAAR) 100 mg, Oral, Daily    Magnesium Oxide 250 mg, Oral, At bedtime PRN   melatonin 5 mg, Oral, At bedtime PRN   metoprolol tartrate (LOPRESSOR) 25 MG tablet TAKE 1 TABLET BY MOUTH 2 (TWO) TIMES DAILY AS NEEDED (IF TOP BLOOD PRESSURE IS OVER 160).   Multiple Vitamins-Minerals (ZINC PO) 1 capsule, Oral, Daily   nicotine (NICODERM CQ - DOSED IN MG/24 HOURS) 21 mg, Transdermal, Daily PRN   TURMERIC PO 1 tablet, Oral, Daily   zolpidem (AMBIEN) 5 mg, Oral, At bedtime PRN    Diet Orders (From admission, onward)     Start     Ordered   05/24/22 1937  Diet Heart Room service appropriate? Yes; Fluid consistency: Thin  Diet effective now       Question Answer Comment  Room service appropriate? Yes   Fluid consistency: Thin      05/24/22 1937            DVT prophylaxis: enoxaparin (LOVENOX) injection 40 mg Start: 05/25/22 1000 SCDs Start: 05/24/22 1937   Lab Results  Component Value Date   PLT 217 05/26/2022      Code Status: Full Code  Family Communication: no family at bedside   Status is: Inpatient  Remains inpatient appropriate because: severity of illness  Level of care: Telemetry Surgical  Consultants:  Orthopedic surgery   Objective: Vitals:   05/25/22 1959 05/26/22 0033 05/26/22 0430 05/26/22 0900  BP: (!) 108/48 (!) 128/51 (!) 121/48 101/80  Pulse:   89 80  Resp:    18  Temp: 97.6 F (36.4 C)   98 F (36.7 C)  TempSrc: Oral   Oral  SpO2: 95%  97% 100%  Weight:      Height:        Intake/Output Summary (Last 24 hours) at 05/26/2022 1203 Last data filed at 05/26/2022 0900 Gross per 24 hour  Intake 240 ml  Output 400 ml  Net -160 ml    Wt Readings from Last 3 Encounters:  05/24/22 66.7 kg  05/21/22 67.6 kg  12/28/21 67.1 kg    Examination:  Constitutional: NAD Eyes: lids and conjunctivae normal, no scleral icterus ENMT: mmm Neck: normal, supple Respiratory: clear to auscultation bilaterally, no wheezing, no crackles. Normal respiratory effort.  Cardiovascular: Regular  rate and rhythm, no murmurs / rubs / gallops. No LE edema. Abdomen: soft, no distention, no tenderness. Bowel sounds positive.  Skin: no rashes Neurologic: no focal deficits, equal strength   Data Reviewed: I have independently reviewed following labs and imaging studies   CBC Recent Labs  Lab 05/24/22 0455 05/25/22 0346 05/26/22 0409  WBC 11.5* 10.3 13.2*  HGB 11.1* 10.0* 9.0*  HCT 33.2* 30.3* 27.3*  PLT 297 252 217  MCV 100.9* 102.7* 102.2*  MCH 33.7 33.9 33.7  MCHC 33.4 33.0 33.0  RDW 13.1 13.2 13.1     Recent Labs  Lab 05/24/22 0455 05/25/22 0346 05/26/22 0409  NA 135 135 135  K 4.2 4.7 4.2  CL 104 106 103  CO2 18* 19* 20*  GLUCOSE 126* 160* 119*  BUN 23 27* 33*  CREATININE 1.07* 1.43* 1.29*  CALCIUM 8.5* 8.4* 8.7*  AST 22  --  34  ALT 20  --  11  ALKPHOS 56  --  42  BILITOT 0.5  --  0.5  ALBUMIN 3.3*  --  3.1*  MG  --   --  2.2     ------------------------------------------------------------------------------------------------------------------ No results for input(s): "CHOL", "HDL", "LDLCALC", "TRIG", "CHOLHDL", "LDLDIRECT" in the last 72 hours.  No results found for: "HGBA1C" ------------------------------------------------------------------------------------------------------------------ No results for input(s): "TSH", "T4TOTAL", "T3FREE", "THYROIDAB" in the last 72 hours.  Invalid input(s): "FREET3"  Cardiac Enzymes No results for input(s): "CKMB", "TROPONINI", "MYOGLOBIN" in the last 168 hours.  Invalid input(s): "CK" ------------------------------------------------------------------------------------------------------------------ No results found for: "BNP"  CBG: Recent Labs  Lab 05/24/22 1851 05/25/22 0829  GLUCAP 172* 164*     Recent Results (from the past 240 hour(s))  Surgical pcr screen     Status: None   Collection Time: 05/24/22  2:56 PM   Specimen: Nasal Mucosa; Nasal Swab  Result Value Ref Range Status   MRSA, PCR  NEGATIVE NEGATIVE Final   Staphylococcus aureus NEGATIVE NEGATIVE Final    Comment: (NOTE) The Xpert SA Assay (FDA approved for NASAL specimens in patients 6 years of age and older), is one component of a comprehensive surveillance program. It is not intended to diagnose infection nor to guide or monitor treatment. Performed at [facility], [place] [address] [place], [place] [address]      Radiology Studies: No results found.   Marzetta Board, MD, PhD [facility]  Between 7 am - 7 pm I am available, please contact me via Amion (for emergencies) or Securechat (non urgent messages)  Between 7 pm - 7 am I am not available, please contact night coverage MD/APP via Amion

## 2022-05-26 NOTE — Progress Notes (Signed)
Orthopaedic Trauma Progress Note  S: Doing well this morning.  Was able to get up and move to bedside chair.  Noted some soreness in the hip but overall pain is well-controlled.  Does have some nausea when up and moving around.  No other issues of note currently.  Daughter at bedside.  O:  Vitals:   05/26/22 0033 05/26/22 0430  BP: (!) 128/51 (!) 121/48  Pulse:  89  Resp:    Temp:    SpO2:  97%    RLE: Dressings removed, incisions are clean, dry, intact.  Thigh soft and compressible. Motor and sensory intact to foot.  Tolerates gentle knee range of motion but does endorse some discomfort up in the hip which is to be expected.  2+ DP pulse  Imaging: Stable postop imaging  Labs:  Results for orders placed or performed during the hospital encounter of 05/24/22 (from the past 24 hour(s))  Comprehensive metabolic panel     Status: Abnormal   Collection Time: 05/26/22  4:09 AM  Result Value Ref Range   Sodium 135 135 - 145 mmol/L   Potassium 4.2 3.5 - 5.1 mmol/L   Chloride 103 98 - 111 mmol/L   CO2 20 (L) 22 - 32 mmol/L   Glucose, Bld 119 (H) 70 - 99 mg/dL   BUN 33 (H) 8 - 23 mg/dL   Creatinine, Ser 1.29 (H) 0.44 - 1.00 mg/dL   Calcium 8.7 (L) 8.9 - 10.3 mg/dL   Total Protein 5.9 (L) 6.5 - 8.1 g/dL   Albumin 3.1 (L) 3.5 - 5.0 g/dL   AST 34 15 - 41 U/L   ALT 11 0 - 44 U/L   Alkaline Phosphatase 42 38 - 126 U/L   Total Bilirubin 0.5 0.3 - 1.2 mg/dL   GFR, Estimated 45 (L) >60 mL/min   Anion gap 12 5 - 15  CBC     Status: Abnormal   Collection Time: 05/26/22  4:09 AM  Result Value Ref Range   WBC 13.2 (H) 4.0 - 10.5 K/uL   RBC 2.67 (L) 3.87 - 5.11 MIL/uL   Hemoglobin 9.0 (L) 12.0 - 15.0 g/dL   HCT 27.3 (L) 36.0 - 46.0 %   MCV 102.2 (H) 80.0 - 100.0 fL   MCH 33.7 26.0 - 34.0 pg   MCHC 33.0 30.0 - 36.0 g/dL   RDW 13.1 11.5 - 15.5 %   Platelets 217 150 - 400 K/uL   nRBC 0.0 0.0 - 0.2 %  Magnesium     Status: None   Collection Time: 05/26/22  4:09 AM  Result Value Ref Range    Magnesium 2.2 1.7 - 2.4 mg/dL    Assessment: 67 year old female s/p cephalomedullary nailing of right hip  Weightbearing: WBAT RLE  Incisional and dressing care: Removed today.  Okay to leave incisions open to air.  Okay to begin showering getting incisions wet  Orthopedic device(s):None needed  CV/Blood loss: ABLA, hemoglobin 9.0 this morning.  Continue to monitor.  Pain management: Tylenol PRN, Norco 7.5mg  PRN, robaxin PRN, morphine IV 0.5-1mg  PRN  VTE prophylaxis: Lovenox, transitition to DOAC upon discharge  ID: Ancef for surgical prophylaxis completed  Dispo: PT/OT eval ongoing, likely SNF upon discharge  Follow - up plan: 2 weeks post discharge for x-rays and wound check   Gwinda Passe PA-C [facility] [phone] (office) [email or web address]

## 2022-05-26 NOTE — TOC CAGE-AID Note (Signed)
Transition of Care [facility]) - CAGE-AID Screening  Patient Details  Name: Kristin Schmidt MRN: [id number] Date of Birth: 05/26/1955  [facility] CM/SW Contact:    Precious Bard, RN Phone Number: 05/26/2022, 1:59 PM   Clinical Narrative:  Patient to the ED after a fall at home. Patient endorses occasional alcohol consumption "on the weekends", denies any illicit drug use. Denies need for resources at this time.  CAGE-AID Screening:   Have You Ever Felt You Ought to Cut Down on Your Drinking or Drug Use?: No Have People Annoyed You By Critizing Your Drinking Or Drug Use?: No Have You Felt Bad Or Guilty About Your Drinking Or Drug Use?: No Have You Ever Had a Drink or Used Drugs First Thing In The Morning to Steady Your Nerves or to Get Rid of a Hangover?: No CAGE-AID Score: 0  Substance Abuse Education Offered: No

## 2022-05-27 ENCOUNTER — Encounter (HOSPITAL_COMMUNITY): Payer: Self-pay | Admitting: Student

## 2022-05-27 DIAGNOSIS — S728X1A Other fracture of right femur, initial encounter for closed fracture: Secondary | ICD-10-CM | POA: Diagnosis not present

## 2022-05-27 LAB — CBC
HCT: 26.5 % — ABNORMAL LOW (ref 36.0–46.0)
Hemoglobin: 8.8 g/dL — ABNORMAL LOW (ref 12.0–15.0)
MCH: 34.2 pg — ABNORMAL HIGH (ref 26.0–34.0)
MCHC: 33.2 g/dL (ref 30.0–36.0)
MCV: 103.1 fL — ABNORMAL HIGH (ref 80.0–100.0)
Platelets: 216 10*3/uL (ref 150–400)
RBC: 2.57 MIL/uL — ABNORMAL LOW (ref 3.87–5.11)
RDW: 13.1 % (ref 11.5–15.5)
WBC: 9.1 10*3/uL (ref 4.0–10.5)
nRBC: 0 % (ref 0.0–0.2)

## 2022-05-27 LAB — BASIC METABOLIC PANEL
Anion gap: 6 (ref 5–15)
BUN: 27 mg/dL — ABNORMAL HIGH (ref 8–23)
CO2: 24 mmol/L (ref 22–32)
Calcium: 8.2 mg/dL — ABNORMAL LOW (ref 8.9–10.3)
Chloride: 104 mmol/L (ref 98–111)
Creatinine, Ser: 1.08 mg/dL — ABNORMAL HIGH (ref 0.44–1.00)
GFR, Estimated: 56 mL/min — ABNORMAL LOW (ref >60)
Glucose, Bld: 99 mg/dL (ref 70–99)
Potassium: 3.7 mmol/L (ref 3.5–5.1)
Sodium: 134 mmol/L — ABNORMAL LOW (ref 135–145)

## 2022-05-27 MED ORDER — LOPERAMIDE HCL 2 MG PO CAPS
2.0000 mg | ORAL_CAPSULE | ORAL | Status: DC | PRN
  Administered 2022-05-27 (×2): 2 mg via ORAL
  Filled 2022-05-27 (×2): qty 1

## 2022-05-27 NOTE — Progress Notes (Signed)
Physical Therapy Treatment Patient Details Name: Kristin Schmidt MRN: [id number] DOB: 1955/09/29 Today's Date: 05/27/2022   History of Present Illness Pt is 67 yo female who sustained a fall and is currently post op cephalomedullary nailing of the right intertrochanteric femur due to fracture.  Postoperatively, upon returning to the floor, rapid response was called due to hypotension and unresponsiveness, responded well to Narcan. PMH: CAD, HTN, renal disorder, Tachycardia    PT Comments    Pt is demonstrating below prior level of function. Previously pt was independent with all activities. Currently pt requires Min A for bed mobility and stair navigation. Pt was able to only ambulate 15 feet before she fatigued and was limited by nausea. Pt is motivated and was high level prior to hospitalization. Due to current functional status, level of assistance available at home and home set up recommending skilled physical therapy services in SNF setting on discharge from acute care hospital setting in order to decrease risk for falls, injury and re-hospitalization. Pt HR up to 127 during session with functional activities, reported nausea during gait    Recommendations for follow up therapy are one component of a multi-disciplinary discharge planning process, led by the attending physician.  Recommendations may be updated based on patient status, additional functional criteria and insurance authorization.  Follow Up Recommendations  Skilled nursing-short term rehab (<3 hours/day) Can patient physically be transported by private vehicle: Yes   Assistance Recommended at Discharge Intermittent Supervision/Assistance  Patient can return home with the following A little help with walking and/or transfers;Assistance with cooking/housework;Help with stairs or ramp for entrance;Assist for transportation   Equipment Recommendations  None recommended by PT    Recommendations for Other Services        Precautions / Restrictions Precautions Precautions: Fall Restrictions Weight Bearing Restrictions: Yes RLE Weight Bearing: Weight bearing as tolerated     Mobility  Bed Mobility Overal bed mobility: Needs Assistance Bed Mobility: Supine to Sit     Supine to sit: Min assist     General bed mobility comments: Pt was MIn A for the RLE for bed mobility due to weakness. Patient Response: Anxious  Transfers Overall transfer level: Needs assistance Equipment used: Rolling walker (2 wheels) Transfers: Sit to/from Stand, Bed to chair/wheelchair/BSC Sit to Stand: Min guard   Step pivot transfers: Min guard       General transfer comment: Pt was min guard due to weakness with increased time to extend trunk.    Ambulation/Gait Ambulation/Gait assistance: Min guard Gait Distance (Feet): 15 Feet (2x) Assistive device: Rolling walker (2 wheels) Gait Pattern/deviations: Step-to pattern, Decreased stance time - right   Gait velocity interpretation: <1.31 ft/sec, indicative of household ambulator   General Gait Details: decreased cadence, limited by nausea due to pain meds per pt. HR up to 127 bpm with activity   Stairs Stairs: Yes Stairs assistance: Min assist Stair Management: Forwards, With walker Number of Stairs: 1 General stair comments: Pt requires 1 person Min A for stair navigation per home set up due to balance and strength deficits.   Wheelchair Mobility    Modified Rankin (Stroke Patients Only)       Balance Overall balance assessment: Needs assistance, History of Falls Sitting-balance support: No upper extremity supported, Single extremity supported Sitting balance-Leahy Scale: Good     Standing balance support: Bilateral upper extremity supported, Reliant on assistive device for balance, During functional activity Standing balance-Leahy Scale: Fair Standing balance comment: CGA for balance during functional activities  Cognition  Arousal/Alertness: Awake/alert Behavior During Therapy: Anxious Overall Cognitive Status: Within Functional Limits for tasks assessed          General Comments General comments (skin integrity, edema, etc.): Pt is very concerned about available assistance if she goes home. She states that she has no assistance.      Pertinent Vitals/Pain Pain Assessment Pain Assessment: Faces Faces Pain Scale: Hurts little more Pain Location: R hip Pain Descriptors / Indicators: Aching Pain Intervention(s): Limited activity within patient's tolerance, Monitored during session     PT Goals (current goals can now be found in the care plan section) Acute Rehab PT Goals Patient Stated Goal: To get stronger PT Goal Formulation: With patient Time For Goal Achievement: 06/08/22 Potential to Achieve Goals: Good Progress towards PT goals: Progressing toward goals    Frequency    Min 4X/week      PT Plan Current plan remains appropriate       AM-PAC PT "6 Clicks" Mobility   Outcome Measure  Help needed turning from your back to your side while in a flat bed without using bedrails?: A Little Help needed moving from lying on your back to sitting on the side of a flat bed without using bedrails?: A Little Help needed moving to and from a bed to a chair (including a wheelchair)?: A Little Help needed standing up from a chair using your arms (e.g., wheelchair or bedside chair)?: A Little Help needed to walk in hospital room?: A Little Help needed climbing 3-5 steps with a railing? : A Little 6 Click Score: 18    End of Session   Activity Tolerance: Patient tolerated treatment well Patient left: with call bell/phone within reach;in chair;with chair alarm set Nurse Communication: Mobility status PT Visit Diagnosis: Unsteadiness on feet (R26.81);Other abnormalities of gait and mobility (R26.89)     Time: 7591-6384 PT Time Calculation (min) (ACUTE ONLY): 25 min  Charges:  $Gait Training:  8-22 mins $Therapeutic Activity: 8-22 mins                    Tomma Rakers, DPT, CLT  Acute Rehabilitation Services Office: [phone] (Secure chat preferred)    Ander Purpura 05/27/2022, 9:58 AM

## 2022-05-27 NOTE — TOC Initial Note (Addendum)
Transition of Care [facility]) - Initial/Assessment Note    Patient Details  Name: Kristin Schmidt MRN: [id number] Date of Birth: 06/10/1955  Transition of Care [facility]) CM/SW Contact:    Joanne Chars, LCSW Phone Number: 05/27/2022, 11:07 AM  Clinical Narrative:   CSW met with pt and daughter Iris regarding DC recommendation for SNF.   Permission given to speak with iris and with sister Stanton Kidney.  They are ageeable to SNF, choice document given, permission given to send out referral in hub.  First choice is [facility].  Pt lives home alone, no current services, daughter works full time Camera operator) and pt does not have additional help at home.         Pt is not vaccinated for covid.    Referral sent out in hub for SNF, CSW reached out to [facility] to review.      1200: [facility] does offer bed, pt does want to accept.  Discussed family providingtransportation with pt and daughter.  Daughter has to work tomorrow but will look at options.  Auth request made with [facility].  9702: TC message from Tammy/[facility].  Pt approved for SNF, 7 days: [id number].  Expected Discharge Plan: Skilled Nursing Facility Barriers to Discharge: Continued Medical Work up, SNF Pending bed offer   Patient Goals and CMS Choice Patient states their goals for this hospitalization and ongoing recovery are:: walking without pain CMS Medicare.gov Compare Post Acute Care list provided to:: Patient Choice offered to / list presented to : Patient, Adult Children (daughter iris)      Expected Discharge Plan and Services In-house Referral: Clinical Social Work   Post Acute Care Choice: [place] Living arrangements for the past 2 months: [place]                                      Prior Living Arrangements/Services Living arrangements for the past 2 months: Single Family Home Lives with:: Self Patient language and need for interpreter reviewed:: Yes Do you feel safe  going back to the place where you live?: Yes      Need for Family Participation in Patient Care: No (Comment) Care giver support system in place?: Yes (comment) Current home services: Other (comment) (none) Criminal Activity/Legal Involvement Pertinent to Current Situation/Hospitalization: No - Comment as needed  Activities of Daily Living Home Assistive Devices/Equipment: Eyeglasses ADL Screening (condition at time of admission) Patient's cognitive ability adequate to safely complete daily activities?: Yes Is the patient deaf or have difficulty hearing?: No Does the patient have difficulty seeing, even when wearing glasses/contacts?: No Does the patient have difficulty concentrating, remembering, or making decisions?: No Patient able to express need for assistance with ADLs?: Yes Does the patient have difficulty dressing or bathing?: No Independently performs ADLs?: Yes (appropriate for developmental age) (prior to fall & admit) Does the patient have difficulty walking or climbing stairs?: Yes Weakness of Legs: Right Weakness of Arms/Hands: Both  Permission Sought/Granted Permission sought to share information with : Family Supports Permission granted to share information with : Yes, Verbal Permission Granted  Share Information with NAME: daughter Corky Sing, sister Stanton Kidney  Permission granted to share info w AGENCY: SNF        Emotional Assessment Appearance:: Appears stated age Attitude/Demeanor/Rapport: Engaged Affect (typically observed): Appropriate, Pleasant Orientation: : Oriented to Self, Oriented to Place, Oriented to  Time, Oriented to Situation  Admission diagnosis:  Other fracture of right femur, initial encounter for closed fracture ([place]) [S72.8X1A] Closed fracture of right hip, initial encounter [facility]) [S72.001A] Patient Active Problem List   Diagnosis Date Noted   Other fracture of right femur, initial encounter for closed fracture ([place]) 05/24/2022   Hypotension  05/24/2022   CKD (chronic kidney disease), stage III ([place]) 05/24/2022   History of tobacco abuse 05/24/2022   Macrocytic anemia 05/24/2022   Leukocytosis 05/24/2022   Atrial tachycardia 11/28/2021   Hypertension 11/28/2021   Proximal humerus fracture 02/20/2017   Hyperlipidemia 12/13/2015   Essential hypertension 09/11/2015   PCP:  Deborah Chalk, FNP Pharmacy:   [id number] [place], [place] [address] Phone: [phone] Fax: [phone]     Social Determinants of Health (SDOH) Social History: SDOH Screenings   Food Insecurity: No Food Insecurity (05/24/2022)  Housing: Low Risk  (05/24/2022)  Transportation Needs: No Transportation Needs (05/24/2022)  Utilities: Not At Risk (05/24/2022)  Tobacco Use: Medium Risk (05/24/2022)   SDOH Interventions:     Readmission Risk Interventions     No data to display

## 2022-05-27 NOTE — Progress Notes (Signed)
Mobility Specialist Progress Note    05/27/22 1528  Mobility  Activity Ambulated with assistance in room  Level of Assistance Contact guard assist, steadying assist  Assistive Device Front wheel walker  Distance Ambulated (ft) 40 ft  RLE Weight Bearing WBAT  Activity Response Tolerated well  Mobility Referral Yes  $Mobility charge 1 Mobility   During Mobility: 115 HR Post-Mobility: 98 HR  Pt received standing in front of chair and agreeable. C/o dull leg pain. Returned to chair with call bell in reach.    Hildred Alamin Mobility Specialist  Please Psychologist, sport and exercise or Rehab Office at [phone]

## 2022-05-27 NOTE — NC FL2 (Signed)
[place] LEVEL OF CARE FORM     IDENTIFICATION  Patient Name: Kristin Schmidt Birthdate: [birth year] Sex: female Admission Date (Current Location): 05/24/2022  [facility] and [place] Number:  Herbalist and Address:  [facility] [place] [place] [address], [place], [place] [address]      Provider Number: [id number]  Attending Physician Name and Address:  Caren Griffins, MD  Relative Name and Phone Number:  Raslowski,Iris Daughter   [phone]    Current Level of Care: Hospital Recommended Level of Care: [place] Prior Approval Number:    Date Approved/Denied:   PASRR Number: [id number]  Discharge Plan: SNF    Current Diagnoses: Patient Active Problem List   Diagnosis Date Noted   Other fracture of right femur, initial encounter for closed fracture ([place]) 05/24/2022   Hypotension 05/24/2022   CKD (chronic kidney disease), stage III ([place]) 05/24/2022   History of tobacco abuse 05/24/2022   Macrocytic anemia 05/24/2022   Leukocytosis 05/24/2022   Atrial tachycardia 11/28/2021   Hypertension 11/28/2021   Proximal humerus fracture 02/20/2017   Hyperlipidemia 12/13/2015   Essential hypertension 09/11/2015    Orientation RESPIRATION BLADDER Height & Weight     Self, Time, Situation, Place  Normal Continent Weight: 147 lb (66.7 kg) Height:  5\' 2"  (157.5 cm)  BEHAVIORAL SYMPTOMS/MOOD NEUROLOGICAL BOWEL NUTRITION STATUS      Continent Diet (see discharge summary)  AMBULATORY STATUS COMMUNICATION OF NEEDS Skin   Supervision Verbally Surgical wounds                       Personal Care Assistance Level of Assistance  Bathing, Feeding, Dressing Bathing Assistance: Limited assistance Feeding assistance: Limited assistance Dressing Assistance: Limited assistance     Functional Limitations Info  Sight, Hearing, Speech Sight Info: Adequate Hearing Info: Adequate Speech Info: Adequate    SPECIAL CARE  FACTORS FREQUENCY  PT (By licensed PT), OT (By licensed OT)     PT Frequency: 5x week OT Frequency: 5x week            Contractures Contractures Info: Not present    Additional Factors Info  Code Status, Allergies Code Status Info: full Allergies Info: Dilaudid (Hydromorphone Hcl), Penicillins           Current Medications (05/27/2022):  This is the current hospital active medication list Current Facility-Administered Medications  Medication Dose Route Frequency Provider Last Rate Last Admin   0.9 %  sodium chloride infusion   Intravenous Continuous Corinne Ports, PA-C   Stopped at 05/25/22 1047   acetaminophen (TYLENOL) tablet 325-650 mg  325-650 mg Oral Q6H PRN Corinne Ports, PA-C   650 mg at 05/25/22 2012   diltiazem (CARDIZEM CD) 24 hr capsule 120 mg  120 mg Oral Daily Marzetta Board M, MD   120 mg at 05/25/22 1050   docusate sodium (COLACE) capsule 100 mg  100 mg Oral BID Rushie Nyhan A, PA-C   100 mg at 05/27/22 1007   enoxaparin (LOVENOX) injection 40 mg  40 mg Subcutaneous Q24H Rushie Nyhan A, PA-C   40 mg at 05/27/22 1008   escitalopram (LEXAPRO) tablet 10 mg  10 mg Oral QHS Caren Griffins, MD   10 mg at 73/71/06 2694   folic acid (FOLVITE) tablet 1 mg  1 mg Oral Daily Rushie Nyhan A, PA-C   1 mg at 05/27/22 1006   HYDROcodone-acetaminophen (NORCO) 7.5-325 MG per tablet 1-2 tablet  1-2 tablet Oral Q4H PRN Corinne Ports, PA-C   2 tablet at 05/27/22 0217   HYDROcodone-acetaminophen (NORCO/VICODIN) 5-325 MG per tablet 1-2 tablet  1-2 tablet Oral Q4H PRN Corinne Ports, PA-C   2 tablet at 05/27/22 0854   melatonin tablet 5 mg  5 mg Oral QHS Corinne Ports, PA-C   5 mg at 05/24/22 2330   methocarbamol (ROBAXIN) tablet 500 mg  500 mg Oral Q6H PRN Corinne Ports, PA-C   500 mg at 05/27/22 8921   Or   methocarbamol (ROBAXIN) 500 mg in dextrose 5 % 50 mL IVPB  500 mg Intravenous Q6H PRN Corinne Ports, PA-C       metoCLOPramide (REGLAN) tablet 5-10 mg   5-10 mg Oral Q8H PRN Thereasa Solo, Sarah A, PA-C       Or   metoCLOPramide (REGLAN) injection 5-10 mg  5-10 mg Intravenous Q8H PRN Corinne Ports, PA-C       morphine (PF) 2 MG/ML injection 0.5-1 mg  0.5-1 mg Intravenous Q3H PRN Thereasa Solo, Sarah A, PA-C       multivitamin with minerals tablet 1 tablet  1 tablet Oral Daily Corinne Ports, PA-C   1 tablet at 05/27/22 1006   naloxone (NARCAN) injection 0.2 mg  0.2 mg Intravenous PRN Fuller Plan A, MD   0.2 mg at 05/24/22 1909   nicotine (NICODERM CQ - dosed in mg/24 hours) patch 21 mg  21 mg Transdermal Daily PRN Corinne Ports, PA-C       ondansetron (ZOFRAN) injection 4 mg  4 mg Intravenous Q6H PRN Rushie Nyhan A, PA-C   4 mg at 05/27/22 1016   polyethylene glycol (MIRALAX / GLYCOLAX) packet 17 g  17 g Oral Daily PRN Corinne Ports, PA-C   17 g at 05/26/22 1941   senna-docusate (Senokot-S) tablet 1 tablet  1 tablet Oral QHS Corinne Ports, PA-C   1 tablet at 05/25/22 2202   thiamine (VITAMIN B1) tablet 100 mg  100 mg Oral Daily Corinne Ports, PA-C   100 mg at 05/27/22 1006   Or   thiamine (VITAMIN B1) injection 100 mg  100 mg Intravenous Daily Rushie Nyhan A, PA-C       zolpidem (AMBIEN) tablet 5 mg  5 mg Oral QHS PRN Corinne Ports, PA-C   5 mg at 05/26/22 2134     Discharge Medications: Please see discharge summary for a list of discharge medications.  Relevant Imaging Results:  Relevant Lab Results:   Additional Information SSN: [id number], pt is not vaccinated for covid  Marcianne Ozbun, Veronia Beets, LCSW

## 2022-05-27 NOTE — Progress Notes (Signed)
Met with patient and spoke with her about the advanced care directive. Patient stated she wants to give medical power of attorney to her daughter, who is a Marine scientist.  Daughter will visit later and they will contact me to return

## 2022-05-27 NOTE — Progress Notes (Signed)
PROGRESS NOTE  Kristin Schmidt INO:[id number] DOB: 06-20-1955 DOA: 05/24/2022 PCP: Deborah Chalk, FNP   LOS: 3 days   Brief Narrative / Interim history: 67 year old female with HTN, CAD, remote tobacco use comes into the hospital after ground-level fall.  She is independent at baseline.  She woke up to go to the bathroom in the morning and had 2 falls.  Imaging in the ER showed a comminuted intertrochanteric fracture of the proximal right femur.  Orthopedic surgery was consulted, and she is status post cephalomedullary nailing of the right intertrochanteric femur fracture by Dr. Doreatha Martin on 1/19.  Postoperatively, upon returning to the floor, rapid response was called due to hypotension and unresponsiveness, responded well to Narcan.  She was also tachycardic with a heart rate into the 140s.  Subjective / 24h Interval events: She is in bed, just finished breakfast.  Denies any chest pain, denies any palpitations.  No abdominal pain, no nausea or vomiting.  Assesement and Plan: Principal Problem:   Other fracture of right femur, initial encounter for closed fracture ([place]) Active Problems:   Hypotension   Leukocytosis   Macrocytic anemia   CKD (chronic kidney disease), stage III (HCC)   History of tobacco abuse  Principal problem Right intertrochanteric hip fracture -due to mechanical fall, no syncope or loss of consciousness reported. She is status post cephalomedullary nailing of the right intertrochanteric femur fracture by Dr. Doreatha Martin on 1/19.  Has been placed on Lovenox for DVT prophylaxis, continue.  PT recommend CIR, however AC for CIR notes that patient does not appear to demonstrate medical necessity.  Pursue SNF, TOC consulted  Active problems History of atrial tachycardia, hypotension -she was to be hypotensive in the ED and again last night, felt to be related to IV pain medications.  She was also tachycardic into the 140s.  Telemetry reviewed, showed atrial tachycardia.  She  is seeing EP as an outpatient for history of the same.  No A-fib noted.  Continue home diltiazem but have to dose due to soft blood pressures postoperatively.  Rates controlled in the 90s.  Elevated WBC-reactive due to hip fracture.  Leukocytosis now resolved  AKI on CKD 3A-baseline creatinine around 1.0, elevated to 1.43 POD #1, likely in the setting of surgical stress and hypotension noted the day of surgery.  Creatinine now being, return to baseline today at 1.08   Anemia, postoperative acute blood loss anemia-stable, monitor, no bleeding.  Hemoglobin down to 8.8, overall stable since yesterday.  No need for transfusions.   History of tobacco abuse -Patient reports that she quit smoking 10 years ago, but people around her smoke for which she wears a nicotine patch (?). -Continue nicotine patch  Scheduled Meds:  diltiazem  120 mg Oral Daily   docusate sodium  100 mg Oral BID   enoxaparin (LOVENOX) injection  40 mg Subcutaneous Q24H   escitalopram  10 mg Oral QHS   folic acid  1 mg Oral Daily   melatonin  5 mg Oral QHS   multivitamin with minerals  1 tablet Oral Daily   senna-docusate  1 tablet Oral QHS   thiamine  100 mg Oral Daily   Or   thiamine  100 mg Intravenous Daily   Continuous Infusions:  sodium chloride Stopped (05/25/22 1047)   methocarbamol (ROBAXIN) IV     PRN Meds:.acetaminophen, HYDROcodone-acetaminophen, HYDROcodone-acetaminophen, methocarbamol **OR** methocarbamol (ROBAXIN) IV, metoCLOPramide **OR** metoCLOPramide (REGLAN) injection, morphine injection, naloxone, nicotine, ondansetron (ZOFRAN) IV, polyethylene glycol, zolpidem  Current Outpatient Medications  Medication Instructions   Ascorbic Acid (VITA-C PO) 2 capsules, Oral, Daily   Cholecalciferol (VITAMIN D3 PO) 1 capsule, Oral, Daily   diltiazem (CARDIZEM CD) 240 mg, Oral, Daily, Please make overdue appt with Dr. Curt Bears before anymore refills. Thank you 1st attempt   escitalopram (LEXAPRO) 10 mg, Oral,  Daily at bedtime   ibuprofen (ADVIL) 200 mg, Oral, Every 6 hours PRN   losartan (COZAAR) 100 mg, Oral, Daily   Magnesium Oxide 250 mg, Oral, At bedtime PRN   melatonin 5 mg, Oral, At bedtime PRN   metoprolol tartrate (LOPRESSOR) 25 MG tablet TAKE 1 TABLET BY MOUTH 2 (TWO) TIMES DAILY AS NEEDED (IF TOP BLOOD PRESSURE IS OVER 160).   Multiple Vitamins-Minerals (ZINC PO) 1 capsule, Oral, Daily   nicotine (NICODERM CQ - DOSED IN MG/24 HOURS) 21 mg, Transdermal, Daily PRN   TURMERIC PO 1 tablet, Oral, Daily   zolpidem (AMBIEN) 5 mg, Oral, At bedtime PRN    Diet Orders (From admission, onward)     Start     Ordered   05/24/22 1937  Diet Heart Room service appropriate? Yes; Fluid consistency: Thin  Diet effective now       Question Answer Comment  Room service appropriate? Yes   Fluid consistency: Thin      05/24/22 1937            DVT prophylaxis: enoxaparin (LOVENOX) injection 40 mg Start: 05/25/22 1000 SCDs Start: 05/24/22 1937   Lab Results  Component Value Date   PLT 216 05/27/2022      Code Status: Full Code  Family Communication: no family at bedside   Status is: Inpatient  Remains inpatient appropriate because: severity of illness  Level of care: Telemetry Surgical  Consultants:  Orthopedic surgery   Objective: Vitals:   05/26/22 1944 05/27/22 0221 05/27/22 0849 05/27/22 1000  BP: (!) 122/53 (!) 137/59 90/63 (!) 113/57  Pulse: 91 95 100   Resp: 18 14 16    Temp: 98.2 F (36.8 C) 98 F (36.7 C) 97.8 F (36.6 C)   TempSrc: Oral Oral Oral   SpO2: 99% 98% 98%   Weight:      Height:        Intake/Output Summary (Last 24 hours) at 05/27/2022 1105 Last data filed at 05/26/2022 1300 Gross per 24 hour  Intake 240 ml  Output --  Net 240 ml    Wt Readings from Last 3 Encounters:  05/24/22 66.7 kg  05/21/22 67.6 kg  12/28/21 67.1 kg    Examination:  Constitutional: NAD Eyes: lids and conjunctivae normal, no scleral icterus ENMT: mmm Neck: normal,  supple Respiratory: clear to auscultation bilaterally, no wheezing, no crackles. Normal respiratory effort.  Cardiovascular: Regular rate and rhythm, no murmurs / rubs / gallops. No LE edema. Abdomen: soft, no distention, no tenderness. Bowel sounds positive.  Skin: no rashes Neurologic: no focal deficits, equal strength   Data Reviewed: I have independently reviewed following labs and imaging studies   CBC Recent Labs  Lab 05/24/22 0455 05/25/22 0346 05/26/22 0409 05/27/22 0337  WBC 11.5* 10.3 13.2* 9.1  HGB 11.1* 10.0* 9.0* 8.8*  HCT 33.2* 30.3* 27.3* 26.5*  PLT 297 252 217 216  MCV 100.9* 102.7* 102.2* 103.1*  MCH 33.7 33.9 33.7 34.2*  MCHC 33.4 33.0 33.0 33.2  RDW 13.1 13.2 13.1 13.1     Recent Labs  Lab 05/24/22 0455 05/25/22 0346 05/26/22 0409 05/27/22 0337  NA 135 135 135 134*  K 4.2 4.7 4.2  3.7  CL 104 106 103 104  CO2 18* 19* 20* 24  GLUCOSE 126* 160* 119* 99  BUN 23 27* 33* 27*  CREATININE 1.07* 1.43* 1.29* 1.08*  CALCIUM 8.5* 8.4* 8.7* 8.2*  AST 22  --  34  --   ALT 20  --  11  --   ALKPHOS 56  --  42  --   BILITOT 0.5  --  0.5  --   ALBUMIN 3.3*  --  3.1*  --   MG  --   --  2.2  --      ------------------------------------------------------------------------------------------------------------------ No results for input(s): "CHOL", "HDL", "LDLCALC", "TRIG", "CHOLHDL", "LDLDIRECT" in the last 72 hours.  No results found for: "HGBA1C" ------------------------------------------------------------------------------------------------------------------ No results for input(s): "TSH", "T4TOTAL", "T3FREE", "THYROIDAB" in the last 72 hours.  Invalid input(s): "FREET3"  Cardiac Enzymes No results for input(s): "CKMB", "TROPONINI", "MYOGLOBIN" in the last 168 hours.  Invalid input(s): "CK" ------------------------------------------------------------------------------------------------------------------ No results found for: "BNP"  CBG: Recent Labs   Lab 05/24/22 1851 05/25/22 0829  GLUCAP 172* 164*     Recent Results (from the past 240 hour(s))  Surgical pcr screen     Status: None   Collection Time: 05/24/22  2:56 PM   Specimen: Nasal Mucosa; Nasal Swab  Result Value Ref Range Status   MRSA, PCR NEGATIVE NEGATIVE Final   Staphylococcus aureus NEGATIVE NEGATIVE Final    Comment: (NOTE) The Xpert SA Assay (FDA approved for NASAL specimens in patients 24 years of age and older), is one component of a comprehensive surveillance program. It is not intended to diagnose infection nor to guide or monitor treatment. Performed at [facility], [address] [place], [place] [address]      Radiology Studies: No results found.   Pamella Pert, MD, PhD [facility]  Between 7 am - 7 pm I am available, please contact me via Amion (for emergencies) or Securechat (non urgent messages)  Between 7 pm - 7 am I am not available, please contact night coverage MD/APP via Amion

## 2022-05-27 NOTE — Plan of Care (Signed)

## 2022-05-28 DIAGNOSIS — S728X1A Other fracture of right femur, initial encounter for closed fracture: Secondary | ICD-10-CM | POA: Diagnosis not present

## 2022-05-28 MED ORDER — METHOCARBAMOL 500 MG PO TABS: 500.0000 mg | ORAL_TABLET | Freq: Four times a day (QID) | ORAL | Status: DC | PRN

## 2022-05-28 MED ORDER — ENOXAPARIN SODIUM 40 MG/0.4ML IJ SOSY: 40.0000 mg | PREFILLED_SYRINGE | INTRAMUSCULAR | Status: DC

## 2022-05-28 MED ORDER — DILTIAZEM HCL ER COATED BEADS 120 MG PO CP24: 120.0000 mg | ORAL_CAPSULE | Freq: Every day | ORAL | Status: DC

## 2022-05-28 MED ORDER — APIXABAN 2.5 MG PO TABS: 2.5000 mg | ORAL_TABLET | Freq: Two times a day (BID) | ORAL | 0 refills | Status: DC

## 2022-05-28 MED ORDER — HYDROCODONE-ACETAMINOPHEN 5-325 MG PO TABS: 1.0000 | ORAL_TABLET | ORAL | 0 refills | Status: DC | PRN

## 2022-05-28 NOTE — TOC Transition Note (Addendum)
Transition of Care [facility]) - CM/SW Discharge Note   Patient Details  Name: Kristin Schmidt MRN: [id number] Date of Birth: 1956/02/20  Transition of Care [facility] CM/SW Contact:  Joanne Chars, LCSW Phone Number: 05/28/2022, 10:00 AM   Clinical Narrative:   Pt discharging to [place], room 702.  RN call report to [phone].    1320: TC daughter Iris.  Her mother called and SNF is saying they were not expecting her.  CSW spoke to Tracy[facility] and she said RN to RN report was not completed.  CSW spoke with RN Nat Math, she did not call report initially but she just got off the phone now, report has been completed.  CSW spoke to [place] and she will confirm they are all set.  Daughter called and updated.   Final next level of care: Skilled Nursing Facility Barriers to Discharge: Barriers Resolved   Patient Goals and CMS Choice CMS Medicare.gov Compare Post Acute Care list provided to:: Patient Choice offered to / list presented to : Patient, Adult Children (daughter iris)  Discharge Placement                Patient chooses bed at:  [facility]) Patient to be transferred to facility by: [place] Name of family member notified: unable to reach daughter Iris, her voicemail also not set up, LM with sister Stanton Kidney. Patient and family notified of of transfer: 05/28/22  1004: daughter Iris now in room and aware of DC.  Discharge Plan and Services Additional resources added to the After Visit Summary for   In-house Referral: Clinical Social Work   Post Acute Care Choice: [place]                               Social Determinants of Health (SDOH) Interventions SDOH Screenings   Food Insecurity: No Food Insecurity (05/24/2022)  Housing: Low Risk  (05/24/2022)  Transportation Needs: No Transportation Needs (05/24/2022)  Utilities: Not At Risk (05/24/2022)  Tobacco Use: Medium Risk (05/27/2022)     Readmission Risk Interventions     No data to  display

## 2022-05-28 NOTE — Plan of Care (Signed)

## 2022-05-28 NOTE — TOC Progression Note (Signed)
Transition of Care [facility]) - Progression Note    Patient Details  Name: Kristin Schmidt MRN: [id number] Date of Birth: 1955-06-14  [facility] CM/SW Contact  Joanne Chars, LCSW Phone Number: 05/28/2022, 9:49 AM  Clinical Narrative:  CSW confirmed with [facility] that they can receive pt today.  CSW again discussed transportation with pt.  She does not want to go by private vehicle and would like ambulance transport.  Discussed that there is possibility that she will receive bill for this and pt still requests ambulance transport.       Expected Discharge Plan: [place] Barriers to Discharge: Continued Medical Work up, SNF Pending bed offer  Expected Discharge Plan and Services In-house Referral: Clinical Social Work   Post Acute Care Choice: [place] Living arrangements for the past 2 months: Single Family Home Expected Discharge Date: 05/28/22                                     Social Determinants of Health (SDOH) Interventions SDOH Screenings   Food Insecurity: No Food Insecurity (05/24/2022)  Housing: Low Risk  (05/24/2022)  Transportation Needs: No Transportation Needs (05/24/2022)  Utilities: Not At Risk (05/24/2022)  Tobacco Use: Medium Risk (05/27/2022)    Readmission Risk Interventions     No data to display

## 2022-05-28 NOTE — Discharge Instructions (Addendum)
Orthopaedic Trauma Service Discharge Instructions   General Discharge Instructions  WEIGHT BEARING STATUS:weightbearing as tolerated  RANGE OF MOTION/ACTIVITY: Ok for range of motion as tolerated  Wound Care: You may remove your surgical dressing. Incisions can be left open to air if there is no drainage. Once the incision is completely dry and without drainage, it may be left open to air out.  Clean incision gently with soap and water.  DVT/PE prophylaxis:  Eliquis 2.5 mg twice daily x 30 days  Diet: as you were eating previously.  Can use over the counter stool softeners and bowel preparations, such as Miralax, to help with bowel movements.  Narcotics can be constipating.  Be sure to drink plenty of fluids  PAIN MEDICATION USE AND EXPECTATIONS  You have likely been given narcotic medications to help control your pain.  After a traumatic event that results in an fracture (broken bone) with or without surgery, it is ok to use narcotic pain medications to help control one's pain.  We understand that everyone responds to pain differently and each individual patient will be evaluated on a regular basis for the continued need for narcotic medications. Ideally, narcotic medication use should last no more than 6-8 weeks (coinciding with fracture healing).   As a patient it is your responsibility as well to monitor narcotic medication use and report the amount and frequency you use these medications when you come to your office visit.   We would also advise that if you are using narcotic medications, you should take a dose prior to therapy to maximize you participation.  IF YOU ARE ON NARCOTIC MEDICATIONS IT IS NOT PERMISSIBLE TO OPERATE A MOTOR VEHICLE (MOTORCYCLE/CAR/TRUCK/MOPED) OR HEAVY MACHINERY DO NOT MIX NARCOTICS WITH OTHER CNS (CENTRAL NERVOUS SYSTEM) DEPRESSANTS SUCH AS ALCOHOL   STOP SMOKING OR USING NICOTINE PRODUCTS!!!!  As discussed nicotine severely impairs your body's ability to  heal surgical and traumatic wounds but also impairs bone healing.  Wounds and bone heal by forming microscopic blood vessels (angiogenesis) and nicotine is a vasoconstrictor (essentially, shrinks blood vessels).  Therefore, if vasoconstriction occurs to these microscopic blood vessels they essentially disappear and are unable to deliver necessary nutrients to the healing tissue.  This is one modifiable factor that you can do to dramatically increase your chances of healing your injury.    (This means no smoking, no nicotine gum, patches, etc)  DO NOT USE NONSTEROIDAL ANTI-INFLAMMATORY DRUGS (NSAID'S)  Using products such as Advil (ibuprofen), Aleve (naproxen), Motrin (ibuprofen) for additional pain control during fracture healing can delay and/or prevent the healing response.  If you would like to take over the counter (OTC) medication, Tylenol (acetaminophen) is ok.  However, some narcotic medications that are given for pain control contain acetaminophen as well. Therefore, you should not exceed more than 4000 mg of tylenol in a day if you do not have liver disease.  Also note that there are may OTC medicines, such as cold medicines and allergy medicines that my contain tylenol as well.  If you have any questions about medications and/or interactions please ask your doctor/PA or your pharmacist.      ICE AND ELEVATE INJURED/OPERATIVE EXTREMITY  Using ice and elevating the injured extremity above your heart can help with swelling and pain control.  Icing in a pulsatile fashion, such as 20 minutes on and 20 minutes off, can be followed.    Do not place ice directly on skin. Make sure there is a barrier between to skin  and the ice pack.    Using frozen items such as frozen peas works well as the conform nicely to the are that needs to be iced.  USE AN ACE WRAP OR TED HOSE FOR SWELLING CONTROL  In addition to icing and elevation, Ace wraps or TED hose are used to help limit and resolve swelling.  It is  recommended to use Ace wraps or TED hose until you are informed to stop.    When using Ace Wraps start the wrapping distally (farthest away from the body) and wrap proximally (closer to the body)   Example: If you had surgery on your leg or thing and you do not have a splint on, start the ace wrap at the toes and work your way up to the thigh        If you had surgery on your upper extremity and do not have a splint on, start the ace wrap at your fingers and work your way up to the upper arm   [place]: [phone]   VISIT OUR WEBSITE FOR ADDITIONAL INFORMATION: [email or web address]      Discharge Wound Care Instructions  Do NOT apply any ointments, solutions or lotions to pin sites or surgical wounds.  These prevent needed drainage and even though solutions like hydrogen peroxide kill bacteria, they also damage cells lining the pin sites that help fight infection.  Applying lotions or ointments can keep the wounds moist and can cause them to breakdown and open up as well. This can increase the risk for infection. When in doubt call the office.  If any drainage is noted, use foam dressing - These dressing supplies should be available at local medical supply stores [facility], [facility], etc) as well as Management consultant ([facility], [facility], [employer], etc)  Once the incision is completely dry and without drainage, it may be left open to air out.  Showering may begin 36-48 hours later.  Cleaning gently with soap and water.

## 2022-05-28 NOTE — Discharge Summary (Addendum)
Physician Discharge Summary  Kristin Schmidt AYT:[id number] DOB: 1956-03-29 DOA: 05/24/2022  PCP: Deborah Chalk, FNP  Admit date: 05/24/2022 Discharge date: 05/28/2022  Admitted From: home Disposition:  SNF  Recommendations for Outpatient Follow-up:  Follow up with PCP in 1-2 weeks Please obtain BMP/CBC in one week  Home Health: none Equipment/Devices: none  Discharge Condition: stable CODE STATUS: Full code Diet Orders (From admission, onward)     Start     Ordered   05/24/22 1937  Diet Heart Room service appropriate? Yes; Fluid consistency: Thin  Diet effective now       Question Answer Comment  Room service appropriate? Yes   Fluid consistency: Thin      05/24/22 1937            HPI: Per admitting MD, Kristin Schmidt is a 67 y.o. female with medical history significant of hypertension, CAD, and remote tree of tobacco abuse who presents after having a fall at home.  At baseline patient ambulates without need of assistance.  Patient states that she had gotten up to use the restroom around 5 AM this morning and slipped out of bed.  She was able to get up and then went to the bathroom and while taking off her shirt fell again landing on her right hip.  Patient reported having severe pain in the right hip and was unable to get up or bear weight on the affected leg.  She denies having any lightheadedness, loss of consciousness, or trauma to her head.  She was able to crawl to the bed where her phone was and pulled all the sheets to get her phone to call for help.  Otherwise patient had been in her normal state of health.  Denies any recent fever, chills, cough, shortness of breath, chest pain, nausea, vomiting, diarrhea, dysuria, or recent sick contacts to her knowledge.  Patient reports that she quit smoking over 10 years ago, but continues to wear nicotine patch due to others smoking around her.  She drinks wine approximately 3-4 times a week drinking 2 "big  glasses". In the emergency department patient was noted to be afebrile with blood pressures as low as 96/49 improvement with IV fluids.  Labs significant for WBC 11.5, hemoglobin 11.1, BUN 23,  and creatinine 1.7.  X-rays of the pelvis revealed a communicated intertrochanteric fracture of the proximal right femur.  Patient had been bolused 500 mL of normal saline IV fluids and given Dilaudid 1 mg IV.  [facility] had been consulted and plan on taking patient to surgery today.  Hospital Course / Discharge diagnoses: Principal Problem:   Other fracture of right femur, initial encounter for closed fracture [facility]) Active Problems:   Hypotension   Leukocytosis   Macrocytic anemia   CKD (chronic kidney disease), stage III ([place])   History of tobacco abuse   Principal problem Right intertrochanteric hip fracture -due to mechanical fall, no syncope or loss of consciousness reported. She is status post cephalomedullary nailing of the right intertrochanteric femur fracture by Dr. Doreatha Martin on 1/19.  Has been placed on Lovenox for DVT prophylaxis, continue until seen as an outpatient by orthopedic surgery.  PT recommends short-term rehab, she has remained stable and improved.  Active problems History of atrial tachycardia, hypotension -patient with a history of hypertension, was noted to be hypotensive in the ED as well as postoperatively.  She has a history of atrial tachycardia for which she is followed with cardiology as an outpatient.  Due to concurrent  hypotension, her home diltiazem was cut in half and her losartan has been held.  She is remained stable on this regimen, no further hypotension and will be discharged on diltiazem 120 mg daily.  Please monitor her blood pressure, and if she tolerates, her diltiazem eventually needs to be changed back to her home dose of 240 mg daily, and losartan resumed Elevated WBC-reactive due to hip fracture.  Leukocytosis now resolved AKI on CKD 3A-baseline creatinine  around 1.0, elevated to 1.43 POD #1, likely in the setting of surgical stress and hypotension noted the day of surgery.  Creatinine now better, returned to baseline at 1.08  Anemia, postoperative acute blood loss anemia-stable, monitor, no bleeding.  Hemoglobin down to 8.8, overall stable.  No bleeding  History of tobacco abuse -Patient reports that she quit smoking 10 years ago, but people around her smoke for which she wears a nicotine patch (?). -Continue nicotine patch  Sepsis ruled out   Discharge Instructions   Allergies as of 05/28/2022       Reactions   Dilaudid [hydromorphone Hcl] Other (See Comments)   Family request. Patient becomes heavily sedated and becomes unresponsive. Hypersensitive to dilaudid.   Penicillins Itching   HIVES 05/24/22 tolerated Cefazolin        Medication List     STOP taking these medications    losartan 100 MG tablet Commonly known as: COZAAR       TAKE these medications    diltiazem 120 MG 24 hr capsule Commonly known as: CARDIZEM CD Take 1 capsule (120 mg total) by mouth daily. Start taking on: May 29, 2022 What changed:  medication strength how much to take additional instructions   enoxaparin 40 MG/0.4ML injection Commonly known as: LOVENOX Inject 0.4 mLs (40 mg total) into the skin daily. Start taking on: May 29, 2022   escitalopram 10 MG tablet Commonly known as: LEXAPRO Take 10 mg by mouth at bedtime.   HYDROcodone-acetaminophen 5-325 MG tablet Commonly known as: NORCO/VICODIN Take 1-2 tablets by mouth every 4 (four) hours as needed for moderate pain (pain score 4-6).   ibuprofen 200 MG tablet Commonly known as: ADVIL Take 200 mg by mouth every 6 (six) hours as needed for mild pain.   Magnesium Oxide 250 MG Tabs Take 250 mg by mouth at bedtime as needed (sleep).   melatonin 5 MG Tabs Take 5 mg by mouth at bedtime as needed (sleep).   methocarbamol 500 MG tablet Commonly known as: ROBAXIN Take 1  tablet (500 mg total) by mouth every 6 (six) hours as needed for muscle spasms.   metoprolol tartrate 25 MG tablet Commonly known as: LOPRESSOR TAKE 1 TABLET BY MOUTH 2 (TWO) TIMES DAILY AS NEEDED (IF TOP BLOOD PRESSURE IS OVER 160).   nicotine 21 mg/24hr patch Commonly known as: NICODERM CQ - dosed in mg/24 hours Place 21 mg onto the skin daily as needed (for smoking).   TURMERIC PO Take 1 tablet by mouth daily.   VITA-C PO Take 2 capsules by mouth daily.   VITAMIN D3 PO Take 1 capsule by mouth daily.   ZINC PO Take 1 capsule by mouth daily.   zolpidem 5 MG tablet Commonly known as: AMBIEN Take 5 mg by mouth at bedtime as needed for sleep.        Contact information for after-discharge care     Destination     [facility] Preferred SNF .   Service: Skilled Nursing Contact information: [address]  [address] [place] [phone]                    Consultations: Orthopedic surgery  Procedures/Studies:  DG HIP PORT UNILAT W OR W/O PELVIS 1V RIGHT  Result Date: 05/24/2022 CLINICAL DATA:  ORIF right hip fracture.  Postoperative. EXAM: DG HIP (WITH OR WITHOUT PELVIS) 1V PORT RIGHT COMPARISON:  Pelvis and right hip radiographs 05/24/2022 at 5:04 a.m. FINDINGS: There is diffuse decreased bone mineralization. Interval cephalomedullary nail fixation of the previously seen proximal right femoral intertrochanteric fracture. Mild 5 mm medial cortical step-off. No evidence of hardware failure. Expected postoperative subcutaneous air lateral to the right hip. Mild superior left femoroacetabular joint space narrowing. IMPRESSION: Interval cephalomedullary nail fixation of the previously seen proximal right femoral intertrochanteric fracture. Electronically Signed   By: Yvonne Kendall M.D.   On: 05/24/2022 18:14   DG FEMUR, MIN 2 VIEWS RIGHT  Result Date: 05/24/2022 CLINICAL DATA:  Intramedullary nailing of right femur.  Intraoperative fluoroscopy. EXAM: RIGHT FEMUR 2 VIEWS COMPARISON:  Pelvis and right hip radiographs 05/24/2022 FINDINGS: Images were performed intraoperatively without the presence of a radiologist. New placement of a right cephalomedullary nail fixating the previously seen intertrochanteric fracture. No hardware complication is seen. Total fluoroscopy images: 5 Total fluoroscopy time: 77 seconds Total dose: Radiation Exposure Index (as provided by the fluoroscopic device): 4.86 mGy air Kerma Please see intraoperative findings for further detail. IMPRESSION: Intraoperative fluoroscopy provided for right cephalomedullary nail fixating the previously seen intertrochanteric fracture. Electronically Signed   By: Yvonne Kendall M.D.   On: 05/24/2022 16:50   DG C-Arm 1-60 Min-No Report  Result Date: 05/24/2022 Fluoroscopy was utilized by the requesting physician.  No radiographic interpretation.   DG Hip Unilat W or Wo Pelvis 2-3 Views Right  Result Date: 05/24/2022 CLINICAL DATA:  Fall onto right hip. EXAM: DG HIP (WITH OR WITHOUT PELVIS) 2-3V RIGHT COMPARISON:  None Available. FINDINGS: Comminuted intertrochanteric fracture of the proximal right femur is noted. Mild superior scratch set there is mild impaction of the fracture fragments. No additional fracture or dislocation. No signs of pelvic diastasis. IMPRESSION: Comminuted intertrochanteric fracture of the proximal right femur. Electronically Signed   By: Kerby Moors M.D.   On: 05/24/2022 05:20     Subjective: - no chest pain, shortness of breath, no abdominal pain, nausea or vomiting.   Discharge Exam: BP 128/73 (BP Location: Left Arm)   Pulse 83   Temp 98.1 F (36.7 C) (Oral)   Resp 18   Ht 5\' 2"  (1.575 m)   Wt 66.7 kg   SpO2 99%   BMI 26.89 kg/m   General: Pt is alert, awake, not in acute distress Cardiovascular: RRR, S1/S2 +, no rubs, no gallops Respiratory: CTA bilaterally, no wheezing, no rhonchi Abdominal: Soft, NT, ND, bowel  sounds + Extremities: no edema, no cyanosis  The results of significant diagnostics from this hospitalization (including imaging, microbiology, ancillary and laboratory) are listed below for reference.     Microbiology: Recent Results (from the past 240 hour(s))  Surgical pcr screen     Status: None   Collection Time: 05/24/22  2:56 PM   Specimen: Nasal Mucosa; Nasal Swab  Result Value Ref Range Status   MRSA, PCR NEGATIVE NEGATIVE Final   Staphylococcus aureus NEGATIVE NEGATIVE Final    Comment: (NOTE) The Xpert SA Assay (FDA approved for NASAL specimens in patients 68 years of age and older), is one component of a comprehensive surveillance program.  It is not intended to diagnose infection nor to guide or monitor treatment. Performed at [facility], [address], [place], [place] [address]      Labs: Basic Metabolic Panel: Recent Labs  Lab 05/24/22 0455 05/25/22 0346 05/26/22 0409 05/27/22 0337  NA 135 135 135 134*  K 4.2 4.7 4.2 3.7  CL 104 106 103 104  CO2 18* 19* 20* 24  GLUCOSE 126* 160* 119* 99  BUN 23 27* 33* 27*  CREATININE 1.07* 1.43* 1.29* 1.08*  CALCIUM 8.5* 8.4* 8.7* 8.2*  MG  --   --  2.2  --    Liver Function Tests: Recent Labs  Lab 05/24/22 0455 05/26/22 0409  AST 22 34  ALT 20 11  ALKPHOS 56 42  BILITOT 0.5 0.5  PROT 5.9* 5.9*  ALBUMIN 3.3* 3.1*   CBC: Recent Labs  Lab 05/24/22 0455 05/25/22 0346 05/26/22 0409 05/27/22 0337  WBC 11.5* 10.3 13.2* 9.1  HGB 11.1* 10.0* 9.0* 8.8*  HCT 33.2* 30.3* 27.3* 26.5*  MCV 100.9* 102.7* 102.2* 103.1*  PLT 297 252 217 216   CBG: Recent Labs  Lab 05/24/22 1851 05/25/22 0829  GLUCAP 172* 164*   Hgb A1c No results for input(s): "HGBA1C" in the last 72 hours. Lipid Profile No results for input(s): "CHOL", "HDL", "LDLCALC", "TRIG", "CHOLHDL", "LDLDIRECT" in the last 72 hours. Thyroid function studies No results for input(s): "TSH", "T4TOTAL", "T3FREE", "THYROIDAB" in the last 72  hours.  Invalid input(s): "FREET3" Urinalysis    Component Value Date/Time   COLORURINE YELLOW 10/25/2013 1128   APPEARANCEUR CLEAR 10/25/2013 1128   LABSPEC 1.002 (L) 10/25/2013 1128   PHURINE 5.5 10/25/2013 1128   GLUCOSEU NEGATIVE 10/25/2013 1128   HGBUR NEGATIVE 10/25/2013 1128   BILIRUBINUR NEGATIVE 10/25/2013 1128   KETONESUR NEGATIVE 10/25/2013 1128   PROTEINUR NEGATIVE 10/25/2013 1128   UROBILINOGEN 0.2 10/25/2013 1128   NITRITE NEGATIVE 10/25/2013 1128   LEUKOCYTESUR NEGATIVE 10/25/2013 1128    FURTHER DISCHARGE INSTRUCTIONS:   Get Medicines reviewed and adjusted: Please take all your medications with you for your next visit with your Primary MD   Laboratory/radiological data: Please request your Primary MD to go over all hospital tests and procedure/radiological results at the follow up, please ask your Primary MD to get all Hospital records sent to his/her office.   In some cases, they will be blood work, cultures and biopsy results pending at the time of your discharge. Please request that your primary care M.D. goes through all the records of your hospital data and follows up on these results.   Also Note the following: If you experience worsening of your admission symptoms, develop shortness of breath, life threatening emergency, suicidal or homicidal thoughts you must seek medical attention immediately by calling 911 or calling your MD immediately  if symptoms less severe.   You must read complete instructions/literature along with all the possible adverse reactions/side effects for all the Medicines you take and that have been prescribed to you. Take any new Medicines after you have completely understood and accpet all the possible adverse reactions/side effects.    Do not drive when taking Pain medications or sleeping medications (Benzodaizepines)   Do not take more than prescribed Pain, Sleep and Anxiety Medications. It is not advisable to combine anxiety,sleep  and pain medications without talking with your primary care practitioner   Special Instructions: If you have smoked or chewed Tobacco  in the last 2 yrs please stop smoking, stop any regular  Alcohol  and or any Recreational drug use.   Wear Seat belts while driving.   Please note: You were cared for by a hospitalist during your hospital stay. Once you are discharged, your primary care physician will handle any further medical issues. Please note that NO REFILLS for any discharge medications will be authorized once you are discharged, as it is imperative that you return to your primary care physician (or establish a relationship with a primary care physician if you do not have one) for your post hospital discharge needs so that they can reassess your need for medications and monitor your lab values.  Time coordinating discharge: 40 minutes  SIGNED:  Pamella Pert, MD, PhD 05/28/2022, 10:37 AM

## 2022-08-09 ENCOUNTER — Other Ambulatory Visit: Payer: Self-pay | Admitting: Cardiology

## 2023-07-23 ENCOUNTER — Emergency Department (HOSPITAL_COMMUNITY)

## 2023-07-23 ENCOUNTER — Inpatient Hospital Stay (HOSPITAL_COMMUNITY)
Admission: EM | Admit: 2023-07-23 | Discharge: 2023-07-29 | DRG: 038 | Disposition: A | Discharge status: Home / Self Care | Attending: Student | Admitting: Student

## 2023-07-23 ENCOUNTER — Other Ambulatory Visit: Payer: Self-pay

## 2023-07-23 ENCOUNTER — Encounter (HOSPITAL_COMMUNITY): Payer: Self-pay | Admitting: Emergency Medicine

## 2023-07-23 DIAGNOSIS — I6522 Occlusion and stenosis of left carotid artery: Secondary | ICD-10-CM | POA: Diagnosis present

## 2023-07-23 DIAGNOSIS — I6523 Occlusion and stenosis of bilateral carotid arteries: Secondary | ICD-10-CM | POA: Insufficient documentation

## 2023-07-23 DIAGNOSIS — F1010 Alcohol abuse, uncomplicated: Secondary | ICD-10-CM | POA: Diagnosis present

## 2023-07-23 DIAGNOSIS — I2510 Atherosclerotic heart disease of native coronary artery without angina pectoris: Secondary | ICD-10-CM | POA: Diagnosis present

## 2023-07-23 DIAGNOSIS — R29810 Facial weakness: Secondary | ICD-10-CM | POA: Diagnosis present

## 2023-07-23 DIAGNOSIS — E785 Hyperlipidemia, unspecified: Secondary | ICD-10-CM | POA: Diagnosis present

## 2023-07-23 DIAGNOSIS — Z7982 Long term (current) use of aspirin: Secondary | ICD-10-CM

## 2023-07-23 DIAGNOSIS — W19XXXA Unspecified fall, initial encounter: Secondary | ICD-10-CM | POA: Diagnosis present

## 2023-07-23 DIAGNOSIS — R2990 Unspecified symptoms and signs involving the nervous system: Secondary | ICD-10-CM | POA: Diagnosis present

## 2023-07-23 DIAGNOSIS — F411 Generalized anxiety disorder: Secondary | ICD-10-CM | POA: Diagnosis present

## 2023-07-23 DIAGNOSIS — R9089 Other abnormal findings on diagnostic imaging of central nervous system: Secondary | ICD-10-CM | POA: Diagnosis not present

## 2023-07-23 DIAGNOSIS — I10 Essential (primary) hypertension: Secondary | ICD-10-CM | POA: Diagnosis present

## 2023-07-23 DIAGNOSIS — F5104 Psychophysiologic insomnia: Secondary | ICD-10-CM | POA: Diagnosis present

## 2023-07-23 DIAGNOSIS — Z79899 Other long term (current) drug therapy: Secondary | ICD-10-CM

## 2023-07-23 DIAGNOSIS — Z880 Allergy status to penicillin: Secondary | ICD-10-CM

## 2023-07-23 DIAGNOSIS — I671 Cerebral aneurysm, nonruptured: Secondary | ICD-10-CM | POA: Insufficient documentation

## 2023-07-23 DIAGNOSIS — M2011 Hallux valgus (acquired), right foot: Secondary | ICD-10-CM | POA: Diagnosis present

## 2023-07-23 DIAGNOSIS — Z87891 Personal history of nicotine dependence: Secondary | ICD-10-CM

## 2023-07-23 DIAGNOSIS — I771 Stricture of artery: Secondary | ICD-10-CM | POA: Insufficient documentation

## 2023-07-23 DIAGNOSIS — H5711 Ocular pain, right eye: Secondary | ICD-10-CM | POA: Diagnosis not present

## 2023-07-23 DIAGNOSIS — I639 Cerebral infarction, unspecified: Secondary | ICD-10-CM | POA: Diagnosis not present

## 2023-07-23 DIAGNOSIS — D62 Acute posthemorrhagic anemia: Secondary | ICD-10-CM | POA: Diagnosis not present

## 2023-07-23 DIAGNOSIS — I63522 Cerebral infarction due to unspecified occlusion or stenosis of left anterior cerebral artery: Principal | ICD-10-CM | POA: Diagnosis present

## 2023-07-23 DIAGNOSIS — R531 Weakness: Secondary | ICD-10-CM

## 2023-07-23 DIAGNOSIS — Z825 Family history of asthma and other chronic lower respiratory diseases: Secondary | ICD-10-CM

## 2023-07-23 DIAGNOSIS — Z7902 Long term (current) use of antithrombotics/antiplatelets: Secondary | ICD-10-CM

## 2023-07-23 DIAGNOSIS — Z7901 Long term (current) use of anticoagulants: Secondary | ICD-10-CM

## 2023-07-23 DIAGNOSIS — I708 Atherosclerosis of other arteries: Secondary | ICD-10-CM | POA: Diagnosis present

## 2023-07-23 DIAGNOSIS — Z95820 Peripheral vascular angioplasty status with implants and grafts: Secondary | ICD-10-CM

## 2023-07-23 DIAGNOSIS — Z716 Tobacco abuse counseling: Secondary | ICD-10-CM

## 2023-07-23 DIAGNOSIS — Y902 Blood alcohol level of 40-59 mg/100 ml: Secondary | ICD-10-CM | POA: Diagnosis present

## 2023-07-23 DIAGNOSIS — R000 Tachycardia, unspecified: Secondary | ICD-10-CM | POA: Diagnosis present

## 2023-07-23 DIAGNOSIS — Z8249 Family history of ischemic heart disease and other diseases of the circulatory system: Secondary | ICD-10-CM

## 2023-07-23 DIAGNOSIS — R29700 NIHSS score 0: Secondary | ICD-10-CM | POA: Diagnosis present

## 2023-07-23 DIAGNOSIS — I4719 Other supraventricular tachycardia: Secondary | ICD-10-CM | POA: Diagnosis present

## 2023-07-23 DIAGNOSIS — M791 Myalgia, unspecified site: Secondary | ICD-10-CM | POA: Diagnosis present

## 2023-07-23 DIAGNOSIS — G459 Transient cerebral ischemic attack, unspecified: Principal | ICD-10-CM

## 2023-07-23 DIAGNOSIS — F17210 Nicotine dependence, cigarettes, uncomplicated: Secondary | ICD-10-CM | POA: Diagnosis present

## 2023-07-23 DIAGNOSIS — G8191 Hemiplegia, unspecified affecting right dominant side: Secondary | ICD-10-CM | POA: Diagnosis present

## 2023-07-23 LAB — COMPREHENSIVE METABOLIC PANEL
ALT: 19 U/L (ref 0–44)
AST: 20 U/L (ref 15–41)
Albumin: 3.6 g/dL (ref 3.5–5.0)
Alkaline Phosphatase: 57 U/L (ref 38–126)
Anion gap: 11 (ref 5–15)
BUN: 10 mg/dL (ref 8–23)
CO2: 22 mmol/L (ref 22–32)
Calcium: 9 mg/dL (ref 8.9–10.3)
Chloride: 103 mmol/L (ref 98–111)
Creatinine, Ser: 0.91 mg/dL (ref 0.44–1.00)
GFR, Estimated: >60 mL/min (ref >60)
Glucose, Bld: 130 mg/dL — ABNORMAL HIGH (ref 70–99)
Potassium: 3.7 mmol/L (ref 3.5–5.1)
Sodium: 136 mmol/L (ref 135–145)
Total Bilirubin: 0.4 mg/dL (ref 0.0–1.2)
Total Protein: 6.6 g/dL (ref 6.5–8.1)

## 2023-07-23 LAB — RAPID URINE DRUG SCREEN, HOSP PERFORMED
Amphetamines: NOT DETECTED
Barbiturates: NOT DETECTED
Benzodiazepines: NOT DETECTED
Cocaine: NOT DETECTED
Opiates: NOT DETECTED
Tetrahydrocannabinol: NOT DETECTED

## 2023-07-23 LAB — CBC
HCT: 42.5 % (ref 36.0–46.0)
Hemoglobin: 14 g/dL (ref 12.0–15.0)
MCH: 33.7 pg (ref 26.0–34.0)
MCHC: 32.9 g/dL (ref 30.0–36.0)
MCV: 102.2 fL — ABNORMAL HIGH (ref 80.0–100.0)
Platelets: 291 10*3/uL (ref 150–400)
RBC: 4.16 MIL/uL (ref 3.87–5.11)
RDW: 12.9 % (ref 11.5–15.5)
WBC: 8.4 10*3/uL (ref 4.0–10.5)
nRBC: 0 % (ref 0.0–0.2)

## 2023-07-23 LAB — ETHANOL: Alcohol, Ethyl (B): 41 mg/dL — ABNORMAL HIGH (ref <10)

## 2023-07-23 MED ORDER — ENOXAPARIN SODIUM 40 MG/0.4ML IJ SOSY
40.0000 mg | PREFILLED_SYRINGE | INTRAMUSCULAR | Status: DC
  Administered 2023-07-24 – 2023-07-27 (×5): 40 mg via SUBCUTANEOUS
  Filled 2023-07-23 (×5): qty 0.4

## 2023-07-23 MED ORDER — THIAMINE MONONITRATE 100 MG PO TABS
100.0000 mg | ORAL_TABLET | Freq: Every day | ORAL | Status: DC
  Administered 2023-07-24 – 2023-07-29 (×5): 100 mg via ORAL
  Filled 2023-07-23 (×5): qty 1

## 2023-07-23 MED ORDER — STROKE: EARLY STAGES OF RECOVERY BOOK
Freq: Once | Status: AC
  Filled 2023-07-23: qty 1

## 2023-07-23 MED ORDER — LORAZEPAM 1 MG PO TABS: 1.0000 mg | ORAL_TABLET | ORAL | Status: AC | PRN

## 2023-07-23 MED ORDER — THIAMINE HCL 100 MG/ML IJ SOLN
100.0000 mg | Freq: Every day | INTRAMUSCULAR | Status: DC
  Filled 2023-07-23 (×2): qty 2

## 2023-07-23 MED ORDER — ADULT MULTIVITAMIN W/MINERALS CH
1.0000 | ORAL_TABLET | Freq: Every day | ORAL | Status: DC
  Administered 2023-07-24 – 2023-07-29 (×5): 1 via ORAL
  Filled 2023-07-23 (×5): qty 1

## 2023-07-23 MED ORDER — LORAZEPAM 2 MG/ML IJ SOLN: 1.0000 mg | INTRAMUSCULAR | Status: AC | PRN

## 2023-07-23 MED ORDER — FOLIC ACID 1 MG PO TABS
1.0000 mg | ORAL_TABLET | Freq: Every day | ORAL | Status: DC
  Administered 2023-07-24 – 2023-07-29 (×5): 1 mg via ORAL
  Filled 2023-07-23 (×5): qty 1

## 2023-07-23 NOTE — ED Provider Notes (Signed)
 [facility] EMERGENCY DEPARTMENT AT [facility] Provider Note   CSN: [id number] Arrival date & time: 07/23/23  1923     History  Chief Complaint  Patient presents with   Transient Ischemic Attack    Kristin Schmidt is a 68 y.o. female.  HPI Patient presents with reported weakness difficulty getting up.  Patient states she felt as though she was weak in her abdomen.  Per EMS report reportedly had some right-sided weakness and left-sided facial droop.  Reportedly symptoms lasted about 10 minutes.  Is a 2 glass of wine.  No headache.  No confusion.  No difficulty speaking.  Reportedly was witnessed by her daughter.   Past Medical History:  Diagnosis Date   Complication of anesthesia    Pt wakes up confused   Coronary artery disease 2007   renal artery stent and subclavian artery stent - [place]   Hypertension    Pneumonia    "walking pneumonia"   Renal disorder    Tachycardia    pt was treated with Carvedilol, taken off of it in May, 2018 due to feeling fatigued.    Home Medications Prior to Admission medications   Medication Sig Start Date End Date Taking? Authorizing Provider  apixaban (ELIQUIS) 2.5 MG TABS tablet Take 1 tablet (2.5 mg total) by mouth 2 (two) times daily. 05/28/22 06/27/22  [place] Bali, PA-C  Ascorbic Acid (VITA-C PO) Take 2 capsules by mouth daily.    [provider]  Cholecalciferol (VITAMIN D3 PO) Take 1 capsule by mouth daily.    [provider]  diltiazem (CARDIZEM CD) 120 MG 24 hr capsule Take 1 capsule (120 mg total) by mouth daily. 05/29/22   Leatha Gilding, MD  escitalopram (LEXAPRO) 10 MG tablet Take 10 mg by mouth at bedtime.    [provider]  HYDROcodone-acetaminophen (NORCO/VICODIN) 5-325 MG tablet Take 1-2 tablets by mouth every 4 (four) hours as needed for moderate pain (pain score 4-6). 05/28/22   Leatha Gilding, MD  Magnesium Oxide 250 MG TABS Take 250 mg by mouth at bedtime as needed  (sleep).    [provider]  melatonin 5 MG TABS Take 5 mg by mouth at bedtime as needed (sleep).    [provider]  methocarbamol (ROBAXIN) 500 MG tablet Take 1 tablet (500 mg total) by mouth every 6 (six) hours as needed for muscle spasms. 05/28/22   Leatha Gilding, MD  metoprolol tartrate (LOPRESSOR) 25 MG tablet TAKE 1 TABLET BY MOUTH 2 (TWO) TIMES DAILY AS NEEDED (IF TOP BLOOD PRESSURE IS OVER 160). 08/09/22   Camnitz, Will Daphine Deutscher, MD  Multiple Vitamins-Minerals (ZINC PO) Take 1 capsule by mouth daily.    [provider]  nicotine (NICODERM CQ - DOSED IN MG/24 HOURS) 21 mg/24hr patch Place 21 mg onto the skin daily as needed (for smoking).    [provider]  TURMERIC PO Take 1 tablet by mouth daily.    [provider]  zolpidem (AMBIEN) 5 MG tablet Take 5 mg by mouth at bedtime as needed for sleep. 11/22/21   [provider]      Allergies    Dilaudid [hydromorphone hcl] and Penicillins    Review of Systems   Review of Systems  Physical Exam Updated Vital Signs BP 122/65   Pulse 96   Temp 97.7 F (36.5 C) (Oral)   Resp 15   Ht 5\' 2"  (1.575 m)   Wt 66.7 kg   SpO2 97%  BMI 26.89 kg/m  Physical Exam Vitals and nursing note reviewed.  Eyes:     Extraocular Movements: Extraocular movements intact.     Pupils: Pupils are equal, round, and reactive to light.  Cardiovascular:     Rate and Rhythm: Regular rhythm.  Pulmonary:     Breath sounds: No wheezing.  Abdominal:     Tenderness: There is no abdominal tenderness.  Neurological:     Mental Status: She is alert and oriented to person, place, and time.     Comments: May have some mild facial asymmetry.  Without smiling may be weak on the left but does have equal smile.  Good grip bilaterally.  Sensation intact bilaterally.  Finger-nose intact bilaterally.  Good straight leg raise bilaterally.     ED Results / Procedures / Treatments   Labs (all labs ordered are  listed, but only abnormal results are displayed) Labs Reviewed  RAPID URINE DRUG SCREEN, HOSP PERFORMED  CBC  COMPREHENSIVE METABOLIC PANEL  ETHANOL    EKG None  Radiology No results found.  Procedures Procedures    Medications Ordered in ED Medications - No data to display  ED Course/ Medical Decision Making/ A&P                                 Medical Decision Making Amount and/or Complexity of Data Reviewed Labs: ordered. Radiology: ordered.   Patient presented with potential neurologic deficits.  Reportedly weak on my right side per family member but per patient states she was weak in the abdomen.  Differential diagnosis includes generalized weakness but also potentially lateralizing causes such as stroke.  Will get head CT and basic blood work.  Will discuss with daughter when she gets here.  Patient's daughter is here and is an Charity fundraiser.  States that the patient was not moving her right side.  Initially did not lift at all but then when instructed lifted but was weak compared to the other side.  Face also reportedly asymmetric.  Patient states she feels much better now.  States she does not remember being weak on one side.  Patient'sABCD 2 score is 4 putting her at moderate risk.    Would require admission to hospital for this.         Final Clinical Impression(s) / ED Diagnoses Final diagnoses:  None    Rx / DC Orders ED Discharge Orders     None         Benjiman Core, MD 07/23/23 2348

## 2023-07-23 NOTE — ED Notes (Signed)
 Pt in xray

## 2023-07-23 NOTE — ED Notes (Signed)
 Pt return from radiology, NAD Noted. Pt A&O x4, pt hooked back up to monitor, call bell within reach, side rails up x2.  Daughter at bedside.

## 2023-07-23 NOTE — ED Notes (Signed)
 Pt c/o pain/discomfort when ambulating to the BR, c/o right foot pain and right hip pain, prior right hip sx. Pt stated she fell earlier today tripping, did not hit her head. Dr. Richrd Humbles notified, appropriate diagnostic testing orders to be placed.

## 2023-07-23 NOTE — ED Triage Notes (Signed)
 Pt BIB [facility] after experiencing right sided weakness, left sided facial droop, symptoms resolved after a few mins, LKW 1800, stroke screens negative with EMS, pt has had 2 glasses of wine, v/s en route 138/58, HR124, RR18, O2 98 RA< CBG 125

## 2023-07-24 ENCOUNTER — Observation Stay (HOSPITAL_COMMUNITY)

## 2023-07-24 DIAGNOSIS — R29810 Facial weakness: Secondary | ICD-10-CM | POA: Diagnosis present

## 2023-07-24 DIAGNOSIS — I671 Cerebral aneurysm, nonruptured: Secondary | ICD-10-CM | POA: Diagnosis not present

## 2023-07-24 DIAGNOSIS — Y902 Blood alcohol level of 40-59 mg/100 ml: Secondary | ICD-10-CM | POA: Diagnosis present

## 2023-07-24 DIAGNOSIS — I6522 Occlusion and stenosis of left carotid artery: Secondary | ICD-10-CM

## 2023-07-24 DIAGNOSIS — I639 Cerebral infarction, unspecified: Secondary | ICD-10-CM | POA: Diagnosis present

## 2023-07-24 DIAGNOSIS — I771 Stricture of artery: Secondary | ICD-10-CM | POA: Insufficient documentation

## 2023-07-24 DIAGNOSIS — Z825 Family history of asthma and other chronic lower respiratory diseases: Secondary | ICD-10-CM | POA: Diagnosis not present

## 2023-07-24 DIAGNOSIS — Z8249 Family history of ischemic heart disease and other diseases of the circulatory system: Secondary | ICD-10-CM | POA: Diagnosis not present

## 2023-07-24 DIAGNOSIS — Z87891 Personal history of nicotine dependence: Secondary | ICD-10-CM | POA: Diagnosis not present

## 2023-07-24 DIAGNOSIS — F1010 Alcohol abuse, uncomplicated: Secondary | ICD-10-CM | POA: Diagnosis present

## 2023-07-24 DIAGNOSIS — I63522 Cerebral infarction due to unspecified occlusion or stenosis of left anterior cerebral artery: Secondary | ICD-10-CM

## 2023-07-24 DIAGNOSIS — M2011 Hallux valgus (acquired), right foot: Secondary | ICD-10-CM | POA: Diagnosis present

## 2023-07-24 DIAGNOSIS — E785 Hyperlipidemia, unspecified: Secondary | ICD-10-CM

## 2023-07-24 DIAGNOSIS — G8191 Hemiplegia, unspecified affecting right dominant side: Secondary | ICD-10-CM | POA: Diagnosis present

## 2023-07-24 DIAGNOSIS — I10 Essential (primary) hypertension: Secondary | ICD-10-CM | POA: Diagnosis present

## 2023-07-24 DIAGNOSIS — I708 Atherosclerosis of other arteries: Secondary | ICD-10-CM | POA: Diagnosis present

## 2023-07-24 DIAGNOSIS — Z880 Allergy status to penicillin: Secondary | ICD-10-CM | POA: Diagnosis not present

## 2023-07-24 DIAGNOSIS — F17210 Nicotine dependence, cigarettes, uncomplicated: Secondary | ICD-10-CM | POA: Diagnosis present

## 2023-07-24 DIAGNOSIS — R29700 NIHSS score 0: Secondary | ICD-10-CM | POA: Diagnosis present

## 2023-07-24 DIAGNOSIS — F411 Generalized anxiety disorder: Secondary | ICD-10-CM | POA: Diagnosis present

## 2023-07-24 DIAGNOSIS — Z79899 Other long term (current) drug therapy: Secondary | ICD-10-CM | POA: Diagnosis not present

## 2023-07-24 DIAGNOSIS — I2510 Atherosclerotic heart disease of native coronary artery without angina pectoris: Secondary | ICD-10-CM | POA: Diagnosis present

## 2023-07-24 DIAGNOSIS — F5104 Psychophysiologic insomnia: Secondary | ICD-10-CM | POA: Diagnosis present

## 2023-07-24 DIAGNOSIS — R000 Tachycardia, unspecified: Secondary | ICD-10-CM | POA: Diagnosis present

## 2023-07-24 DIAGNOSIS — I6389 Other cerebral infarction: Secondary | ICD-10-CM

## 2023-07-24 DIAGNOSIS — Z7982 Long term (current) use of aspirin: Secondary | ICD-10-CM | POA: Diagnosis not present

## 2023-07-24 DIAGNOSIS — Z7902 Long term (current) use of antithrombotics/antiplatelets: Secondary | ICD-10-CM | POA: Diagnosis not present

## 2023-07-24 DIAGNOSIS — Z716 Tobacco abuse counseling: Secondary | ICD-10-CM | POA: Diagnosis not present

## 2023-07-24 DIAGNOSIS — R2990 Unspecified symptoms and signs involving the nervous system: Secondary | ICD-10-CM | POA: Diagnosis not present

## 2023-07-24 DIAGNOSIS — D62 Acute posthemorrhagic anemia: Secondary | ICD-10-CM | POA: Diagnosis not present

## 2023-07-24 DIAGNOSIS — I4719 Other supraventricular tachycardia: Secondary | ICD-10-CM | POA: Diagnosis not present

## 2023-07-24 DIAGNOSIS — W19XXXA Unspecified fall, initial encounter: Secondary | ICD-10-CM | POA: Diagnosis present

## 2023-07-24 DIAGNOSIS — I6523 Occlusion and stenosis of bilateral carotid arteries: Secondary | ICD-10-CM | POA: Insufficient documentation

## 2023-07-24 DIAGNOSIS — Z7901 Long term (current) use of anticoagulants: Secondary | ICD-10-CM | POA: Diagnosis not present

## 2023-07-24 LAB — CBC
HCT: 40.3 % (ref 36.0–46.0)
Hemoglobin: 13.3 g/dL (ref 12.0–15.0)
MCH: 33.5 pg (ref 26.0–34.0)
MCHC: 33 g/dL (ref 30.0–36.0)
MCV: 101.5 fL — ABNORMAL HIGH (ref 80.0–100.0)
Platelets: 266 10*3/uL (ref 150–400)
RBC: 3.97 MIL/uL (ref 3.87–5.11)
RDW: 12.8 % (ref 11.5–15.5)
WBC: 9.1 10*3/uL (ref 4.0–10.5)
nRBC: 0 % (ref 0.0–0.2)

## 2023-07-24 LAB — LIPID PANEL
Cholesterol: 227 mg/dL — ABNORMAL HIGH (ref 0–200)
HDL: 46 mg/dL (ref >40)
LDL Cholesterol: 126 mg/dL — ABNORMAL HIGH (ref 0–99)
Total CHOL/HDL Ratio: 4.9 ratio
Triglycerides: 274 mg/dL — ABNORMAL HIGH (ref <150)
VLDL: 55 mg/dL — ABNORMAL HIGH (ref 0–40)

## 2023-07-24 LAB — ECHOCARDIOGRAM COMPLETE
Height: 62 in
S' Lateral: 2.9 cm
Weight: 2352 [oz_av]

## 2023-07-24 LAB — CREATININE, SERUM
Creatinine, Ser: 0.96 mg/dL (ref 0.44–1.00)
GFR, Estimated: >60 mL/min (ref >60)

## 2023-07-24 LAB — HIV ANTIBODY (ROUTINE TESTING W REFLEX): HIV Screen 4th Generation wRfx: NONREACTIVE

## 2023-07-24 LAB — HEMOGLOBIN A1C
Hgb A1c MFr Bld: 5.3 % (ref 4.8–5.6)
Mean Plasma Glucose: 105.41 mg/dL

## 2023-07-24 MED ORDER — NICOTINE 14 MG/24HR TD PT24
14.0000 mg | MEDICATED_PATCH | Freq: Every day | TRANSDERMAL | Status: DC
  Administered 2023-07-24 – 2023-07-29 (×6): 14 mg via TRANSDERMAL
  Filled 2023-07-24 (×6): qty 1

## 2023-07-24 MED ORDER — LABETALOL HCL 5 MG/ML IV SOLN: 5.0000 mg | INTRAVENOUS | Status: DC | PRN

## 2023-07-24 MED ORDER — LORAZEPAM 2 MG/ML IJ SOLN
1.0000 mg | Freq: Once | INTRAMUSCULAR | Status: AC | PRN
  Administered 2023-07-24: 1 mg via INTRAVENOUS
  Filled 2023-07-24: qty 1

## 2023-07-24 MED ORDER — IOHEXOL 350 MG/ML SOLN
75.0000 mL | Freq: Once | INTRAVENOUS | Status: AC | PRN
  Administered 2023-07-24: 75 mL via INTRAVENOUS

## 2023-07-24 MED ORDER — TRAZODONE HCL 50 MG PO TABS
50.0000 mg | ORAL_TABLET | Freq: Once | ORAL | Status: AC
Start: 2023-07-24 — End: 2023-07-24
  Administered 2023-07-24: 50 mg via ORAL
  Filled 2023-07-24: qty 1

## 2023-07-24 MED ORDER — LACTATED RINGERS IV SOLN: INTRAVENOUS | Status: AC

## 2023-07-24 MED ORDER — ATORVASTATIN CALCIUM 80 MG PO TABS
80.0000 mg | ORAL_TABLET | Freq: Every day | ORAL | Status: DC
  Administered 2023-07-25 – 2023-07-27 (×3): 80 mg via ORAL
  Filled 2023-07-24 (×4): qty 1

## 2023-07-24 MED ORDER — PERFLUTREN LIPID MICROSPHERE
1.0000 mL | INTRAVENOUS | Status: AC | PRN
  Administered 2023-07-24: 2 mL via INTRAVENOUS

## 2023-07-24 MED ORDER — TRAZODONE HCL 50 MG PO TABS
150.0000 mg | ORAL_TABLET | Freq: Every evening | ORAL | Status: DC | PRN
  Administered 2023-07-24 – 2023-07-29 (×5): 150 mg via ORAL
  Filled 2023-07-24 (×6): qty 1

## 2023-07-24 MED ORDER — ASPIRIN 81 MG PO TBEC
81.0000 mg | DELAYED_RELEASE_TABLET | Freq: Every day | ORAL | Status: DC
  Administered 2023-07-24 – 2023-07-29 (×5): 81 mg via ORAL
  Filled 2023-07-24 (×5): qty 1

## 2023-07-24 MED ORDER — ASPIRIN 81 MG PO TBEC: 81.0000 mg | DELAYED_RELEASE_TABLET | Freq: Every day | ORAL | Status: DC

## 2023-07-24 MED ORDER — CLOPIDOGREL BISULFATE 75 MG PO TABS
75.0000 mg | ORAL_TABLET | Freq: Every day | ORAL | Status: DC
  Administered 2023-07-24 – 2023-07-29 (×5): 75 mg via ORAL
  Filled 2023-07-24 (×5): qty 1

## 2023-07-24 NOTE — ED Notes (Signed)
 Echo at bedside

## 2023-07-24 NOTE — ED Notes (Signed)
 Assumed pt care from [facility]

## 2023-07-24 NOTE — Progress Notes (Signed)
 [facility] night cross cover note:   Per patient's request, I have resumed her home prn trazodone for sleep.     Newton Pigg, DO Hospitalist

## 2023-07-24 NOTE — Plan of Care (Signed)
 Updated patient's daughter and sister at bedside this afternoon.

## 2023-07-24 NOTE — Evaluation (Signed)
 Occupational Therapy Evaluation Patient Details Name: Kristin Schmidt MRN: [id number] DOB: Jun 17, 1955 Today's Date: 07/24/2023   History of Present Illness   68 year old F presenting with right-sided weakness and left facial droop noted by patient's daughter.  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  Some right foot pain but x-rays negative for fracture.  PMH of HTN, HLD, tachycardia, ETOH use, anxiety, insomnia and tobacco use disorder.     Clinical Impressions Pt currently at min guard to supervision assist for selfcare tasks without use of an assistive device.  She was independent/modified independent for all selfcare and transfers prior to this event.  She lives with her daughter who works 3-4 days a week as a Engineer, civil (consulting).  Slightly elevated BP in supine at 180/64 and then in sitting at 185/84 with HR elevating from low 90s up to 125 BPM with activity.  Nursing made aware.  Feel pt is close to baseline functionally with slight limitations secondary to left foot pain but could probably be modified independent with ADL tasks with more practice and integration of her current DME at home (RW, tub seat, handicapped toilet).  No further acute OT needs or follow-up recommended at this time.  Pt will have initial supervision from her daughter at discharge for the first few days.  Feel pt will likely be OK on her own for periods of time.      If plan is discharge home, recommend the following:   Assist for transportation;Help with stairs or ramp for entrance     Functional Status Assessment   Patient has not had a recent decline in their functional status     Equipment Recommendations   None recommended by OT      Precautions/Restrictions   Precautions Precautions: Fall Recall of Precautions/Restrictions: Intact Restrictions Weight Bearing Restrictions Per Provider Order: No      Mobility Bed Mobility Overal bed mobility: Needs Assistance Bed Mobility: Supine to Sit     Supine to sit: Supervision          Transfers Overall transfer level: Needs assistance Equipment used: None Transfers: Sit to/from Stand, Bed to chair/wheelchair/BSC Sit to Stand: Supervision     Step pivot transfers: Supervision     General transfer comment: Contact guard assist for mobility without use of an assistive device.      Balance Overall balance assessment: Needs assistance Sitting-balance support: Feet unsupported Sitting balance-Leahy Scale: Fair     Standing balance support: During functional activity Standing balance-Leahy Scale: Fair                             ADL either performed or assessed with clinical judgement   ADL Overall ADL's : Needs assistance/impaired Eating/Feeding: Independent;Sitting   Grooming: Wash/dry hands;Wash/dry face;Supervision/safety   Upper Body Bathing: Set up;Sitting   Lower Body Bathing: Supervison/ safety;Sit to/from stand   Upper Body Dressing : Supervision/safety;Sitting   Lower Body Dressing: Supervision/safety;Sit to/from stand   Toilet Transfer: Ambulation;Contact guard assist   Toileting- Clothing Manipulation and Hygiene: Supervision/safety;Sit to/from stand       Functional mobility during ADLs: Contact guard assist (ambulation without assistive device) General ADL Comments: Pt is currently at supervision for selfcare tasks with min guard for transfers secondary to one slight LOB which she was able to correct without assist when ambulating in the hallway without an assistive device.  Pt reports having shower seat at  home, rollator, and regular walker if needed.  HR elevating from low 90s up to 125 BPM during activity.  Oxygen sats stable at 97% or better on room air.  BP slightly elevated at 180/64 supine with HOB up and then in sitting at 185/84 sitting EOB.     Vision Baseline Vision/History: 1  Wears glasses (reading glasses only) Ability to See in Adequate Light: 0 Adequate Patient Visual Report: No change from baseline Vision Assessment?: No apparent visual deficits     Perception Perception: Within Functional Limits       Praxis Praxis: WFL       Pertinent Vitals/Pain Pain Assessment Pain Assessment: 0-10 Pain Location: left foot with weightbearing Pain Descriptors / Indicators: Discomfort Pain Intervention(s): Limited activity within patient's tolerance, Repositioned     Extremity/Trunk Assessment Upper Extremity Assessment Upper Extremity Assessment: Overall WFL for tasks assessed   Lower Extremity Assessment Lower Extremity Assessment: Defer to PT evaluation   Cervical / Trunk Assessment Cervical / Trunk Assessment: Normal   Communication Communication Communication: No apparent difficulties   Cognition Arousal: Alert Behavior During Therapy: WFL for tasks assessed/performed Cognition: No apparent impairments             OT - Cognition Comments: Pt alert, oriented to place, time, situation.  Able to recall 1/3 words after 2 min delay, 3/3 after additional 1 minute delay.                 Following commands: Intact                  Home Living Family/patient expects to be discharged to:: Private residence Living Arrangements: Children (daughter works in nursing at [employer] so pt is alone for 12 hours 3-4 days.  Daughter states they will arrange 24 hour if needed.)   Type of Home: Apartment Home Access: Stairs to enter Entrance Stairs-Number of Steps: 1  can go up through another area with no step   Home Layout: One level     Bathroom Shower/Tub: Tub/shower unit;Curtain   Bathroom Toilet: Handicapped height     Home Equipment: Shower seat;Grab bars - tub/shower;Rolling Environmental consultant (2 wheels);Rollator (4 wheels)          Prior Functioning/Environment Prior Level of Function : Independent/Modified Independent              Mobility Comments: Did not need assistive device prior to this                      AM-PAC OT "6 Clicks" Daily Activity     Outcome Measure Help from another person eating meals?: None Help from another person taking care of personal grooming?: A Little Help from another person toileting, which includes using toliet, bedpan, or urinal?: A Little Help from another person bathing (including washing, rinsing, drying)?: A Little Help from another person to put on and taking off regular upper body clothing?: A Little Help from another person to put on and taking off regular lower body clothing?: A Little 6 Click Score: 19   End of Session Equipment Utilized During Treatment: Gait belt Nurse Communication: Mobility status;Other (comment) (elevated BP)  Activity Tolerance: Patient tolerated treatment well Patient left: in bed;with call bell/phone within reach                   Time: 1033-1110 OT Time Calculation (min): 37 min Charges:  OT General Charges $OT Visit: 1 Visit OT Evaluation $OT Eval Moderate Complexity: 1  Mod OT Treatments $Self Care/Home Management : 8-22 mins  Perrin Maltese, OTR/L Acute Rehabilitation Services  Office [phone] 07/24/2023

## 2023-07-24 NOTE — ED Notes (Signed)
 Patient transported to CT

## 2023-07-24 NOTE — Discharge Instructions (Addendum)
 Outpatient Substance Abuse  Treatment- uninsured  Narcotics Anonymous 24-HOUR HELPLINE Pre-recorded for Meeting Schedules [facility] [phone]  [email or web address] ALCOHOLICS ANONYMOUS  [facility]  Answering Service [phone] Please Note: All [place] Meetings are Non-smoking [email or web address].es  Alcohol and Drug Services -  Insurance: Medicaid /State funding/private insurance Methadone, suboxone/Intensive outpatient  [place]   [phone] Fax: [phone] [address] [place], [place], [address] [place] [phone] Fax: [phone]    [address] [place], [place], [address] ([address] [place], [place], [place], [place], [place], [place], [place], [place]) [facility] [email or web address] Accepts State funding/Medicaid Transitional housing, Intensive Outpatient Treatment, Outpatient treatment, Veterans Services  Phone: [phone] Fax: [phone] Address: [address], [place] [address]   [employer] [facility] ([email or web address].[email or web address]/) Insurance: Medicaid Case Management, Administrator, arts, Medication Management, Outpatient Therapy, Psychosocial Rehabilitation, Substance Abuse Intensive Outpatient  Phone: [phone] Fax: [phone] [address], [place], [place], [address]  [facility] Medicaid, most private insurance providers Types of Program: Individual/Group Therapy, Substance Abuse Treatment  Phone: [place] [phone] Fax: [phone] [address], [place], [place], [address] [place] [phone] [address] [place], [place], [address]  [facility]  Medicaid Types of Program: SAIOP  Phone: [phone] Fax: [phone] [address] [place], [place], [place], [address] [facility] Medicaid/state funds Crisis line [phone] [facility] [phone] [place] [phone]  [place] [phone]  [facility] [address];  [place], [place] [address] [phone]  Substance Abuse Intensive Outpatient Program [facility] [address] [place], [place] [address] [phone] Substance abuse treatment  [facility]  Insurance: [facility], [address], sliding scale Types of Program: substance abuse treatment, transportation assistance Phone: [phone] Fax: [phone] Address: [address], [place] [address] [facility] (TrendSwap.ch) Insurance: UHC, [place], [place], [place] Program: addiction counseling, detoxification,  Phone: [phone]  Fax: [phone] Address: [address] [place], [place] [address]  [facility] (statewide facilities/programs) [address] (Medicaid/state funds) [place], [place] [address]                      http://[email or web address]/ [phone] Marcy Panning- [phone] [place]- [phone] [facility] [place] (2 Locations) (Medicaid/state funds) --[address]  walk in 8:30-12 and 1-2:30 [place], [id number]   [facility]- [phone] --[address] [place], [place] [address]  [id number] [phone] walk in 8:30-12 and 2-3:30  [facility] state funds/medicaid [address] [place], [place] [address] [phone] [facility] (Suboxone clinic) Medicaid/state funds  [address]  [place], [place] [address] [phone]   [facility]  [address] [place], [place] [address]  [phone] (24 hours) [place]- [address] [place], [place] [address]  [phone] (24 hours) [place]- [address] Brooke Dare [phone] [place]- [address] Rosalita Levan [phone] Turner Daniels [address] [place] [phone] [facility]- Medicaid and state funds  [place]- [address] [place], [place] [address] [phone] (24 hours) [place]- [address] [place],  [place] [address] [phone] [facility]- [address] [place], [place] [address] [phone] (24 hours) [place] [address]  [address] [place], [place]  [address] [phone] [facility] [address] [place] [phone]      [facility]  Discharge Instructions   Carotid Surgery  Please refer to the following instructions for your post-procedure care. Your surgeon or physician assistant will discuss any changes with you.  Activity  You are encouraged to walk as much as you can. You can slowly return to normal activities but must avoid strenuous activity and heavy lifting until your doctor tell you it's okay. Avoid activities such as vacuuming or swinging a golf club. You can drive after one week if you are comfortable and you are no longer taking prescription pain medications. It is normal to feel tired for serval weeks after your surgery. It is also normal to have difficulty with sleep habits, eating, and bowel movements after surgery. These will go away with time.  Bathing/Showering  Shower daily after you go home. Do not soak in a bathtub, hot tub, or swim until the incision heals completely.  Incision Care  Shower every day. Clean your incision with mild soap and water. Pat the area dry with a clean towel. You do not need a bandage unless otherwise instructed. Do not apply any ointments or creams to your incision. You may have skin glue on your incision. Do not peel it off. It will come off on its own in about one week. Your incision may feel thickened and raised for several weeks after your surgery. This is normal and the skin will soften over time.   For Men Only: It's okay to shave around the incision but do not shave the incision itself for 2 weeks. It is common to have numbness under your chin that could last for several months.  Diet  Resume your normal diet. There are no special food restrictions following this procedure. A low fat/low  cholesterol diet is recommended for all patients with vascular disease. In order to heal from your surgery, it is CRITICAL to get adequate nutrition. Your body requires vitamins, minerals, and protein. Vegetables are the best source of vitamins and minerals. Vegetables also provide the perfect balance of protein. Processed food has little nutritional value, so try to avoid this.  Medications  Resume taking all of your medications unless your doctor or physician assistant tells you not to. If your incision is causing pain, you may take over-the- counter pain relievers such as acetaminophen (Tylenol). If you were prescribed a stronger pain medication, please be aware these medications can cause nausea and constipation. Prevent nausea by taking the medication with a snack or meal. Avoid constipation by drinking plenty of fluids and eating foods with a high amount of fiber, such as fruits, vegetables, and grains.  Do not take Tylenol if you are taking prescription pain medications.  Follow Up  Our office will schedule a follow up appointment 2-3 weeks following discharge.  Please call us immediately for any of the following conditions  Increased pain, redness, drainage (pus) from your incision site. Fever of 101 degrees or higher. If you should develop stroke (slurred speech, difficulty swallowing, weakness on one side of your body, loss of vision) you should call 911 and go to the nearest emergency room.  Reduce your risk of vascular disease:  Stop smoking. If you would like help call [facility] at 1-800-QUIT-NOW (9403070151) or [place] at [phone]. Manage your cholesterol Maintain a desired weight Control your diabetes Keep your blood pressure down  If you have any questions, please  call the office at [phone].

## 2023-07-24 NOTE — H&P (Signed)
 History and Physical  Kristin Schmidt HKV:[id number] DOB: 03/31/1956 DOA: 07/23/2023  Referring physician: Dr. Rubin Payor, EDP  PCP: Eather Colas, FNP  Outpatient Specialists: None Patient coming from: Home  Chief Complaint: Right-sided weakness, left facial droop.  HPI: Kristin Schmidt is a 68 y.o. female with medical history significant for generalized anxiety disorder, hypertension, hyperlipidemia, insomnia, who presents from home via EMS due to right-sided weakness and left facial droop.  Last known well around 6 PM.  Symptoms resolved after a few minutes.  States she had a fall after her leg gave out.  Drank 2 glasses of wine prior to arrival to the ER.  In the ER, right-sided weakness was improved however her left facial droop persists.  EDP discussed the case with neurology.  Recommended admission for stroke workup.  Noncontrast head CT showed small area of decreased density and loss of gray-white differentiation in the posterior right frontal lobe may represent an area of subacute ischemia.  CT angio head and neck was negative for LVO however showed bulky calcified plaque about the left carotid bulb with resultant severe near occlusive stenosis.  Right carotid bulb with associated stenosis of up to 65%.  60% stenosis involving the proximal left subclavian artery.  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  Noncontrast brain MRI revealed 2.3's acute left ACA distribution infarct involving the parasagittal left frontal lobe/cingulate gyrus.  No associated hemorrhage or mass effect.  Underlying mild chronic microvascular ischemic disease with a few scattered remote lacunar infarcts about the hemispheric cerebral white matter and thalami.  Admitted by [facility], hospitalist service, for further stroke workup.  ED Course: Temperature 97.8.  BP 138/59, pulse 96, respiratory 20, saturation 91% on room air.  LDL 126.  A1c 5.3.  Review of  Systems: Review of systems as noted in the HPI. All other systems reviewed and are negative.   Past Medical History:  Diagnosis Date   Complication of anesthesia    Pt wakes up confused   Coronary artery disease 2007   renal artery stent and subclavian artery stent - [place]   Hypertension    Pneumonia    "walking pneumonia"   Renal disorder    Tachycardia    pt was treated with Carvedilol, taken off of it in May, 2018 due to feeling fatigued.   Past Surgical History:  Procedure Laterality Date   CESAREAN SECTION     FRACTURE SURGERY Left    leg   INTRAMEDULLARY (IM) NAIL INTERTROCHANTERIC Right 05/24/2022   Procedure: INTRAMEDULLARY NAILING OF RIGHT FEMUR;  Surgeon: Roby Lofts, MD;  Location: [facility] OR;  Service: Orthopedics;  Laterality: Right;   ORIF HUMERUS FRACTURE Left 02/20/2017   Procedure: OPEN REDUCTION INTERNAL FIXATION (ORIF) LEFT PROXIMAL HUMERUS FRACTURE;  Surgeon: Francena Hanly, MD;  Location: [facility] OR;  Service: Orthopedics;  Laterality: Left;   stent placement right kidney       Social History:  reports that she quit smoking about 7 years ago. Her smoking use included cigarettes. She has never used smokeless tobacco. She reports current alcohol use. She reports that she does not use drugs.   Allergies  Allergen Reactions   Dilaudid [Hydromorphone Hcl] Other (See Comments)    Family request. Patient becomes heavily sedated and becomes unresponsive. Hypersensitive to dilaudid.   Penicillins Itching    HIVES 05/24/22 tolerated Cefazolin    Family History  Problem Relation Age of Onset   COPD Mother    COPD Father  Heart attack Maternal Grandfather    Cancer Sister       Prior to Admission medications   Medication Sig Start Date End Date Taking? Authorizing Provider  diltiazem (CARDIZEM CD) 240 MG 24 hr capsule Take 240 mg by mouth daily. 05/15/23  Yes [provider]  escitalopram (LEXAPRO) 20 MG tablet Take 20 mg by mouth daily. 07/06/23  Yes  [provider]  pravastatin (PRAVACHOL) 10 MG tablet Take 10 mg by mouth daily. 07/10/23  Yes [provider]  traZODone (DESYREL) 150 MG tablet Take 150 mg by mouth at bedtime. 07/10/23  Yes [provider]  traZODone (DESYREL) 50 MG tablet Take 50-150 mg by mouth at bedtime. 06/18/23  Yes [provider]  apixaban (ELIQUIS) 2.5 MG TABS tablet Take 1 tablet (2.5 mg total) by mouth 2 (two) times daily. 05/28/22 06/27/22  [place], PA-C  Ascorbic Acid (VITA-C PO) Take 2 capsules by mouth daily.    [provider]  Cholecalciferol (VITAMIN D3 PO) Take 1 capsule by mouth daily.    [provider]  diltiazem (CARDIZEM CD) 120 MG 24 hr capsule Take 1 capsule (120 mg total) by mouth daily. 05/29/22   Leatha Gilding, MD  escitalopram (LEXAPRO) 10 MG tablet Take 10 mg by mouth at bedtime.    [provider]  HYDROcodone-acetaminophen (NORCO/VICODIN) 5-325 MG tablet Take 1-2 tablets by mouth every 4 (four) hours as needed for moderate pain (pain score 4-6). 05/28/22   Leatha Gilding, MD  Magnesium Oxide 250 MG TABS Take 250 mg by mouth at bedtime as needed (sleep).    [provider]  melatonin 5 MG TABS Take 5 mg by mouth at bedtime as needed (sleep).    [provider]  methocarbamol (ROBAXIN) 500 MG tablet Take 1 tablet (500 mg total) by mouth every 6 (six) hours as needed for muscle spasms. 05/28/22   Leatha Gilding, MD  metoprolol tartrate (LOPRESSOR) 25 MG tablet TAKE 1 TABLET BY MOUTH 2 (TWO) TIMES DAILY AS NEEDED (IF TOP BLOOD PRESSURE IS OVER 160). 08/09/22   Camnitz, Will Daphine Deutscher, MD  Multiple Vitamins-Minerals (ZINC PO) Take 1 capsule by mouth daily.    [provider]  nicotine (NICODERM CQ - DOSED IN MG/24 HOURS) 21 mg/24hr patch Place 21 mg onto the skin daily as needed (for smoking).    [provider]  TURMERIC PO Take 1 tablet by mouth daily.    [provider]  zolpidem (AMBIEN)  5 MG tablet Take 5 mg by mouth at bedtime as needed for sleep. 11/22/21   [provider]    Physical Exam: BP 110/62   Pulse 100   Temp 97.8 F (36.6 C) (Oral)   Resp 20   Ht 5\' 2"  (1.575 m)   Wt 66.7 kg   SpO2 90%   BMI 26.89 kg/m   General: 68 y.o. year-old female well developed well nourished in no acute distress.  Alert and oriented x3.  Left facial droop. Cardiovascular: Regular rate and rhythm with no rubs or gallops.  No thyromegaly or JVD noted.  No lower extremity edema. 2/4 pulses in all 4 extremities. Respiratory: Clear to auscultation with no wheezes or rales. Good inspiratory effort. Abdomen: Soft nontender nondistended with normal bowel sounds x4 quadrants. Muskuloskeletal: No cyanosis, clubbing or edema noted bilaterally Neuro: CN II-XII intact, strength, sensation, reflexes Skin: No ulcerative lesions noted or rashes Psychiatry: Judgement and insight appear normal. Mood is appropriate for condition  and setting          Labs on Admission:  Basic Metabolic Panel: Recent Labs  Lab 07/23/23 1930 07/24/23 0030  NA 136  --   K 3.7  --   CL 103  --   CO2 22  --   GLUCOSE 130*  --   BUN 10  --   CREATININE 0.91 0.96  CALCIUM 9.0  --    Liver Function Tests: Recent Labs  Lab 07/23/23 1930  AST 20  ALT 19  ALKPHOS 57  BILITOT 0.4  PROT 6.6  ALBUMIN 3.6   No results for input(s): "LIPASE", "AMYLASE" in the last 168 hours. No results for input(s): "AMMONIA" in the last 168 hours. CBC: Recent Labs  Lab 07/23/23 1930 07/24/23 0030  WBC 8.4 9.1  HGB 14.0 13.3  HCT 42.5 40.3  MCV 102.2* 101.5*  PLT 291 266   Cardiac Enzymes: No results for input(s): "CKTOTAL", "CKMB", "CKMBINDEX", "TROPONINI" in the last 168 hours.  BNP (last 3 results) No results for input(s): "BNP" in the last 8760 hours.  ProBNP (last 3 results) No results for input(s): "PROBNP" in the last 8760 hours.  CBG: No results for input(s): "GLUCAP" in the last 168  hours.  Radiological Exams on Admission: MR BRAIN WO CONTRAST Result Date: 07/24/2023 CLINICAL DATA:  Follow-up examination for acute stroke. EXAM: MRI HEAD WITHOUT CONTRAST TECHNIQUE: Multiplanar, multiecho pulse sequences of the brain and surrounding structures were obtained without intravenous contrast. COMPARISON:  Comparison made with prior CTA from earlier same day as well as prior CT from 07/23/2023. FINDINGS: Brain: Cerebral volume within normal limits. Patchy T2/FLAIR hyperintensity involving the periventricular and deep white matter both cerebral hemispheres, consistent with chronic small vessel ischemic disease, mild in nature. Few scattered remote lacunar infarcts present about the hemispheric cerebral white matter and thalami. 2.3 cm focus of restricted diffusion involving the parasagittal left frontal lobe/cingulate gyrus, consistent with an of acute left ACA distribution infarct (series 5, image 90). No associated hemorrhage or mass effect. No other evidence for acute or subacute ischemia. Gray-white matter differentiation otherwise maintained. No acute or chronic intracranial blood products. No mass lesion, midline shift or mass effect. No hydrocephalus or extra-axial fluid collection. Pituitary gland within normal limits. Vascular: Major intracranial vascular flow voids are maintained. Skull and upper cervical spine: Craniocervical junction within normal limits. Bone marrow signal intensity normal. No scalp soft tissue abnormality. Sinuses/Orbits: Globes normal soft tissues within normal limits. Paranasal sinuses are largely clear. No mastoid effusion. Other: None. IMPRESSION: 1. 2.3 cm acute left ACA distribution infarct involving the parasagittal left frontal lobe/cingulate gyrus. No associated hemorrhage or mass effect. 2. Underlying mild chronic microvascular ischemic disease with a few scattered remote lacunar infarcts about the hemispheric cerebral white matter and thalami. Electronically  Signed   By: Rise Mu M.D.   On: 07/24/2023 03:34   CT ANGIO HEAD NECK W WO CM Result Date: 07/24/2023 CLINICAL DATA:  Initial evaluation for acute neuro deficit, stroke suspected. EXAM: CT ANGIOGRAPHY HEAD AND NECK WITH AND WITHOUT CONTRAST TECHNIQUE: Multidetector CT imaging of the head and neck was performed using the standard protocol during bolus administration of intravenous contrast. Multiplanar CT image reconstructions and MIPs were obtained to evaluate the vascular anatomy. Carotid stenosis measurements (when applicable) are obtained utilizing NASCET criteria, using the distal internal carotid diameter as the denominator. RADIATION DOSE REDUCTION: This exam was performed according to the departmental dose-optimization program which includes automated exposure control, adjustment of the mA  and/or kV according to patient size and/or use of iterative reconstruction technique. CONTRAST:  75mL OMNIPAQUE IOHEXOL 350 MG/ML SOLN COMPARISON:  Prior CT from 07/23/2023. FINDINGS: CTA NECK FINDINGS Aortic arch: Visualized arch within normal limits for caliber with standard 3 vessel morphology. Advanced atheromatous change about the arch and origin of the great vessels. Associated 60% stenosis involving the proximal left subclavian artery (series 6, image 10). Right carotid system: Right common and internal carotid arteries are patent without dissection. Atheromatous change about the right carotid bulb with associated stenosis of up to 65% by NASCET criteria. Left carotid system: Left common and internal carotid arteries are patent without dissection. Bulky calcified plaque about the left carotid bulb with resultant severe near occlusive stenosis (series 7, image 138). A radiographic string sign is present. Vertebral arteries: Both vertebral arteries arise from subclavian arteries. Atheromatous change at the origin of the right vertebral artery with mild stenosis. Vertebral arteries otherwise patent  without stenosis or dissection. Skeleton: No discrete or worrisome osseous lesions. Other neck: No other acute finding. Upper chest: No other acute finding. Review of the MIP images confirms the above findings CTA HEAD FINDINGS Anterior circulation: Atheromatous change about the carotid siphons with no more than mild multifocal narrowing bilaterally. Left A1 segment patent. Normal anterior communicating artery complex. Anterior cerebral arteries patent without stenosis. No M1 stenosis or occlusion. 3 mm saccular aneurysm seen extending inferiorly from the left MCA bifurcation. Distal MCA branches perfused and fairly symmetric. Posterior circulation: Right vertebral artery slightly dominant. Atheromatous plaque within the mid right V4 segment with short-segment mild stenosis. Left V4 segment widely patent. Left PICA patent. Right PICA not well seen. Basilar widely patent without stenosis. Superior cerebellar and posterior cerebral arteries patent bilaterally. Venous sinuses: Patent allowing for timing the contrast bolus. Anatomic variants: As above. Review of the MIP images confirms the above findings IMPRESSION: 1. Negative CTA for acute large vessel occlusion or other emergent finding. 2. Bulky calcified plaque about the left carotid bulb with resultant severe near occlusive stenosis. 3. Atheromatous change about the right carotid bulb with associated stenosis of up to 65% by NASCET criteria. 4. 60% stenosis involving the proximal left subclavian artery. 5. 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 6.  Aortic Atherosclerosis (ICD10-I70.0). Electronically Signed   By: Rise Mu M.D.   On: 07/24/2023 01:53   CT HEAD WO CONTRAST Result Date: 07/23/2023 CLINICAL DATA:  Transient ischemic attack. Left facial droop and right-sided weakness, now resolved. EXAM: CT HEAD WITHOUT CONTRAST TECHNIQUE: Contiguous axial images were obtained from the base of the skull through the  vertex without intravenous contrast. RADIATION DOSE REDUCTION: This exam was performed according to the departmental dose-optimization program which includes automated exposure control, adjustment of the mA and/or kV according to patient size and/or use of iterative reconstruction technique. COMPARISON:  None Available. FINDINGS: Brain: Small area of decreased density and loss of gray-white differentiation in the posterior right frontal lobe, series 3, image 22. This may represent an area of subacute ischemia. No mass effect. No intracranial hemorrhage. No hydrocephalus. Brain volume is normal for age. No subdural or extra-axial collection. No midline shift or evidence of mass lesion. Vascular: Atherosclerosis of skullbase vasculature without hyperdense vessel or abnormal calcification. Skull: No fracture or focal lesion. Sinuses/Orbits: Paranasal sinuses and mastoid air cells are clear. The visualized orbits are unremarkable. Other: None. IMPRESSION: Small area of decreased density and loss of gray-white differentiation in the posterior right frontal lobe, may represent  an area of subacute ischemia. Recommend MRI for further assessment Electronically Signed   By: Narda Rutherford M.D.   On: 07/23/2023 21:09   DG Foot Complete Right Result Date: 07/23/2023 CLINICAL DATA:  Right foot pain after fall. EXAM: RIGHT FOOT COMPLETE - 3+ VIEW COMPARISON:  None Available. FINDINGS: There is no evidence of fracture or dislocation. Hallux valgus with degenerative change of the first metatarsal phalangeal joint. Soft tissues are unremarkable. IMPRESSION: 1. No fracture or dislocation of the right foot. 2. Hallux valgus with degenerative change of the first metatarsophalangeal joint. Electronically Signed   By: Narda Rutherford M.D.   On: 07/23/2023 21:02   DG Hip Unilat W or Wo Pelvis 2-3 Views Right Result Date: 07/23/2023 CLINICAL DATA:  Pain after fall. EXAM: DG HIP (WITH OR WITHOUT PELVIS) 2-3V RIGHT COMPARISON:   05/24/2022 FINDINGS: ORIF of right proximal femur fracture. No periprosthetic lucency. No evidence of acute fracture of the pelvis or right hip. Pubic rami are intact. Pubic symphysis and sacroiliac joints are congruent. IMPRESSION: 1. No acute fracture of the pelvis or right hip. 2. ORIF of remote right proximal femur fracture. No hardware complication. Electronically Signed   By: Narda Rutherford M.D.   On: 07/23/2023 21:00    EKG: I independently viewed the EKG done and my findings are as followed: Sinus rhythm rate of 95.  Nonspecific ST-T changes.  QTC 450.  Assessment/Plan Present on Admission:  Stroke-like symptoms  Principal Problem:   Stroke-like symptoms  Acute ischemic CVA Presented with right-sided weakness, left facial droop MRI revealed 2.3's acute left ACA distribution infarct involving the parasagittal left frontal lobe/cingulate gyrus.  No associated hemorrhage or mass effect.  Stroke workup in place LDL 126, goal less than 70 A1c 5.3, at goal. Neurochecks, 2D echo, PT/OT evaluation Neurology consulted  Hyperlipidemia LDL 126 Goal LDL less than 70 Lipitor 80 mg daily added  Hypertension Permissive hypertension in place Hold off home oral antihypertensives IV labetalol as needed with parameters Treat SBP greater than 220 or DBP greater than 120 Closely monitor vital signs  Alcohol use disorder Alcohol level 41 CIWA protocol in place Multivitamin, thiamine, and folic acid supplements  Chronic insomnia Generalized anxiety disorder Resume home regimen  Right foot pain after fall X-ray of right foot negative for fracture or dislocation Fall precautions   Time: 75 minutes.   DVT prophylaxis: Subcu Lovenox daily  Code Status: Full code  Family Communication: None at bedside.  Disposition Plan: Admitted to telemetry medical unit  Consults called: Neurology.  Admission status: Observation status.   Status is: Observation    Darlin Drop  MD [facility] Pager [phone]  If 7PM-7AM, please contact night-coverage [email or web address] Password [facility]  07/24/2023, 5:58 AM

## 2023-07-24 NOTE — Progress Notes (Signed)
 CSW added substance abuse resources to patient's AVS.  Edwin Dada, MSW, LCSW Transitions of Care  Clinical Social Worker II [phone]

## 2023-07-24 NOTE — Consult Note (Addendum)
 Hospital Consult    Reason for Consult:  symptomatic left carotid artery stenosis Requesting Physician:  Alanda Slim MRN #:  [id number]  History of Present Illness: This is a 68 y.o. female who presented to the hospital with episode of left facial droop and right sided weakness that did resolve.  She states she was laying in bed and just couldn't get up.  She states she really didn't notice the facial droop but her daughter did.  She states she has never had these symptoms before.   She states that she had quit smoking years ago but has started back because of boredom.    She states that she has been on Lipitor in the past and she developed "bojangle legs" where she was wobbly and they did change it.  She states she does have some of those same sx now.  She states that due to this, she did fall yesterday but both legs were equal.  She has not tried Crestor.   She denies any claudication sx in her calves or buttocks or thighs.  She denies any non healing wounds.  She denies any hx of MI.    Upon review of the chart, she has hx of left subclavian artery stent and last duplex in 2023 revealed it was patent.  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  Largest diameter of abdominal aorta at that time was 1.8cm.  She does not have family hx of AAA.  The pt is on a statin for cholesterol management.  The pt is on a daily aspirin.   Other AC:  Plavix (started this admit) The pt is on BB, CCB for hypertension.   (PTA) The pt is is not on medication for diabetes PTA. Tobacco hx:  former but has started back smoking  Past Medical History:  Diagnosis Date   Complication of anesthesia    Pt wakes up confused   Coronary artery disease 2007   renal artery stent and subclavian artery stent - [place]   Hypertension    Pneumonia    "walking pneumonia"   Renal disorder    Tachycardia    pt was treated with Carvedilol, taken off of it in May, 2018 due  to feeling fatigued.    Past Surgical History:  Procedure Laterality Date   CESAREAN SECTION     FRACTURE SURGERY Left    leg   INTRAMEDULLARY (IM) NAIL INTERTROCHANTERIC Right 05/24/2022   Procedure: INTRAMEDULLARY NAILING OF RIGHT FEMUR;  Surgeon: Roby Lofts, MD;  Location: [facility] OR;  Service: Orthopedics;  Laterality: Right;   ORIF HUMERUS FRACTURE Left 02/20/2017   Procedure: OPEN REDUCTION INTERNAL FIXATION (ORIF) LEFT PROXIMAL HUMERUS FRACTURE;  Surgeon: Francena Hanly, MD;  Location: [facility] OR;  Service: Orthopedics;  Laterality: Left;   stent placement right kidney       Allergies  Allergen Reactions   Dilaudid [Hydromorphone Hcl] Other (See Comments)    Family request. Patient becomes heavily sedated and becomes unresponsive. Hypersensitive to dilaudid.   Penicillins Itching    HIVES 05/24/22 tolerated Cefazolin    Prior to Admission medications   Medication Sig Start Date End Date Taking? Authorizing Provider  acetaminophen (TYLENOL) 325 MG tablet Take 650 mg by mouth every 6 (six) hours as needed.   Yes [provider]  Ascorbic Acid (VITA-C PO) Take 2 capsules by mouth daily.   Yes [provider]  aspirin EC 81 MG tablet Take 81  mg by mouth daily. Swallow whole.   Yes [provider]  Cholecalciferol (VITAMIN D3 PO) Take 1 capsule by mouth daily.   Yes [provider]  diltiazem (CARDIZEM CD) 240 MG 24 hr capsule Take 240 mg by mouth every evening. 05/15/23  Yes [provider]  escitalopram (LEXAPRO) 20 MG tablet Take 20 mg by mouth daily. 07/06/23  Yes [provider]  ibuprofen (ADVIL) 200 MG tablet Take 400 mg by mouth every 6 (six) hours as needed for headache or mild pain (pain score 1-3).   Yes [provider]  Magnesium Oxide 250 MG TABS Take 250 mg by mouth 2 (two) times daily.   Yes [provider]  metoprolol tartrate (LOPRESSOR) 25 MG tablet TAKE 1 TABLET BY MOUTH 2 (TWO) TIMES DAILY AS NEEDED  (IF TOP BLOOD PRESSURE IS OVER 160). 08/09/22  Yes Camnitz, Will Daphine Deutscher, MD  Misc Natural Products (MILK THISTLE) CAPS Take 1 capsule by mouth 2 (two) times daily.   Yes [provider]  nicotine (NICODERM CQ - DOSED IN MG/24 HOURS) 21 mg/24hr patch Place 21 mg onto the skin daily as needed (for smoking).   Yes [provider]  pravastatin (PRAVACHOL) 10 MG tablet Take 10 mg by mouth daily. 07/10/23  Yes [provider]  traZODone (DESYREL) 150 MG tablet Take 150 mg by mouth at bedtime. 07/10/23  Yes [provider]  TURMERIC PO Take 1 tablet by mouth daily.   Yes [provider]  zinc gluconate 50 MG tablet Take 50 mg by mouth daily.   Yes [provider]    Social History   Socioeconomic History   Marital status: Single    Spouse name: Not on file   Number of children: Not on file   Years of education: Not on file   Highest education level: Not on file  Occupational History   Not on file  Tobacco Use   Smoking status: Former    Current packs/day: 0.00    Types: Cigarettes    Quit date: 11/20/2015    Years since quitting: 7.6   Smokeless tobacco: Never  Vaping Use   Vaping status: Never Used  Substance and Sexual Activity   Alcohol use: Yes    Comment: wine   Drug use: No   Sexual activity: Not on file  Other Topics Concern   Not on file  Social History Narrative   Not on file   Social Drivers of Health   Financial Resource Strain: Not on file  Food Insecurity: No Food Insecurity (05/24/2022)   Hunger Vital Sign    Worried About Running Out of Food in the Last Year: Never true    Ran Out of Food in the Last Year: Never true  Transportation Needs: No Transportation Needs (05/24/2022)   PRAPARE - Administrator, Civil Service (Medical): No    Lack of Transportation (Non-Medical): No  Physical Activity: Not on file  Stress: Not on file  Social Connections: Not on file  Intimate Partner Violence: Not At Risk  (05/24/2022)   Humiliation, Afraid, Rape, and Kick questionnaire    Fear of Current or Ex-Partner: No    Emotionally Abused: No    Physically Abused: No    Sexually Abused: No    Family History  Problem Relation Age of Onset   COPD Mother    COPD Father    Heart attack Maternal Grandfather    Cancer Sister     ROS: [  x] Positive   [ ]  Negative   [ ]  All sytems reviewed and are negative  Cardiac: []  chest pain/pressure []  hx MI []  SOB   Vascular: []  pain in legs while walking []  pain in legs at rest []  pain in legs at night []  non-healing ulcers []  hx of DVT []  swelling in legs  Pulmonary: []  asthma/wheezing []  home O2  Neurologic: [x]  hx of CVA []  mini stroke   Hematologic: []  hx of cancer  Endocrine:   []  diabetes []  thyroid disease  GI []  GERD  GU: []  CKD/renal failure []  HD--[]  M/W/F or []  T/T/S  Psychiatric: []  anxiety []  depression  Musculoskeletal: []  arthritis []  joint pain  Integumentary: []  rashes []  ulcers  Constitutional: []  fever  []  chills  Physical Examination  Vitals:   07/24/23 0600 07/24/23 0827  BP: (!) 138/59   Pulse: 96   Resp: 20   Temp:  98.1 F (36.7 C)  SpO2: 91%    Body mass index is 26.89 kg/m.  General:  WDWN in NAD Gait: Not observed HENT: WNL, normocephalic Pulmonary: normal non-labored breathing Cardiac: regular, without carotid bruits Abdomen:  soft, NT; aortic pulse is not palpable Skin: without rashes Vascular Exam/Pulses:  Right Left  Radial 2+ (normal) 2+ (normal)  Femoral 2+ (normal) Unable to palpate  DP 2+ (normal) Unable to palpate  PT Unable to palpate Unable to palpate   Extremities: no non healing wounds or swelling Musculoskeletal: no muscle wasting or atrophy  Neurologic: A&O X 3 Psychiatric:  The pt has Normal affect.   CBC    Component Value Date/Time   WBC 9.1 07/24/2023 0030   RBC 3.97 07/24/2023 0030   HGB 13.3 07/24/2023 0030   HGB 13.6 03/19/2019 1335   HCT 40.3  07/24/2023 0030   HCT 39.1 03/19/2019 1335   PLT 266 07/24/2023 0030   PLT 323 03/19/2019 1335   MCV 101.5 (H) 07/24/2023 0030   MCV 94 03/19/2019 1335   MCH 33.5 07/24/2023 0030   MCHC 33.0 07/24/2023 0030   RDW 12.8 07/24/2023 0030   RDW 13.3 03/19/2019 1335   LYMPHSABS 2.2 10/25/2013 1121   MONOABS 0.7 10/25/2013 1121   EOSABS 0.0 10/25/2013 1121   BASOSABS 0.1 10/25/2013 1121    BMET    Component Value Date/Time   NA 136 07/23/2023 1930   NA 131 (L) 11/28/2021 0942   K 3.7 07/23/2023 1930   CL 103 07/23/2023 1930   CO2 22 07/23/2023 1930   GLUCOSE 130 (H) 07/23/2023 1930   BUN 10 07/23/2023 1930   BUN 22 11/28/2021 0942   CREATININE 0.96 07/24/2023 0030   CALCIUM 9.0 07/23/2023 1930   GFRNONAA >60 07/24/2023 0030   GFRAA 107 03/19/2019 1335    COAGS: No results found for: "INR", "PROTIME"   Non-Invasive Vascular Imaging:   CTA head/neck 07/24/2023 IMPRESSION: 1. Negative CTA for acute large vessel occlusion or other emergent finding. 2. Bulky calcified plaque about the left carotid bulb with resultant severe near occlusive stenosis. 3. Atheromatous change about the right carotid bulb with associated stenosis of up to 65% by NASCET criteria. 4. 60% stenosis involving the proximal left subclavian artery. 5. 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    ASSESSMENT/PLAN: This is a 68 y.o. female with symptomatic left carotid artery stenosis and hx of left subclavian artery stent and celiac and mesenteric stenosis.     -pt with some right sided weakness and left facial droop  that has since resolved.  CTA neck reveals string sign left carotid and 65% stenosis right carotid.  Briefly discussed TCAR vs open endarterectomy.  Discussed that MD would review CT scan and determine best plan. -unable to palpate left femoral pulse or pedal pulses.  Pt is asymptomatic.   -discussed importance of smoking cessation -Dr. Lenell Antu to evaluate pt  and determine further plan  -of note, pt states she could not tolerate Lipitor due to myalgias and that has been ordered here.  She has not tried Crestor.   May benefit from Crestor instead of Lipitor.    Doreatha Massed, PA-C [facility] [phone]  VASCULAR STAFF ADDENDUM: I have independently interviewed and examined the patient. I agree with the above.  Symptomatic left carotid artery stenosis.  CT A = severe plaque throughout L CCA and bifurcation. Disease appears amenable to CEA. Plan L CEA Monday 07/28/23 with Dr. Chestine Spore or later with me. She would like to think about timing overnight and discuss in morning.    Rande Brunt. Lenell Antu, MD [facility] Phone Number: [phone] 07/24/2023 4:21 PM

## 2023-07-24 NOTE — Progress Notes (Signed)
 PROGRESS NOTE  Kristin Schmidt WUJ:[id number] DOB: [birth year]   PCP: Eather Colas, FNP  Patient is from: Home.  Lives with daughter.  Independently ambulates at baseline.  DOA: 07/23/2023 LOS: 0  Chief complaints Chief Complaint  Patient presents with   Transient Ischemic Attack     Brief Narrative / Interim history: 68 year old F with PMH of HTN, HLD, tachycardia, EtOH use, anxiety, insomnia and tobacco use disorder presenting with right-sided weakness and left facial droop noted by patient's daughter, and admitted for CVA workup.  In ED, stable vitals.  Neurology consulted.  Noncontrast head CT showed small area of decreased density and loss of gray-white differentiation in the posterior right frontal lobe may represent an area of subacute ischemia.  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  No associated hemorrhage or mass effect.  Patient was started on Plavix and aspirin  The next day, vascular surgery consulted.   Subjective: Seen and examined earlier this morning.  No major events overnight of this morning.  Patient has no specific complaints.  She denies focal weakness, numbness or tingling.   Objective: Vitals:   07/24/23 0500 07/24/23 0600 07/24/23 0827 07/24/23 0932  BP: 127/79 (!) 138/59  (!) 157/71  Pulse: (!) 103 96  97  Resp: 17 20  18   Temp:   98.1 F (36.7 C)   TempSrc:   Oral   SpO2: 93% 91%  98%  Weight:      Height:        Examination:  GENERAL: No apparent distress.  Nontoxic. HEENT: MMM.  Vision and hearing grossly  intact.  NECK: Supple.  No apparent JVD.  RESP:  No IWOB.  Fair aeration bilaterally. CVS:  RRR. Heart sounds normal.  ABD/GI/GU: BS+. Abd soft, NTND.  MSK/EXT:  Moves extremities. No apparent deformity. No edema.  SKIN: no apparent skin lesion or wound NEURO: Awake, alert and oriented appropriately.  Speech clear.  PERRL.  No facial asymmetry.  Motor 4/5 in RLE and 5/5 elsewhere.  Light sensation intact.  No pronator drift.  Finger-to-nose intact. PSYCH: Calm. Normal affect.   Consultants:  Neurology Vascular surgery  Procedures: None  Microbiology summarized: None  Assessment and plan: Acute ischemic CVA: Presented with right-sided weakness, left facial droop.  MRI revealed 2.3cm acute left ACA distribution infarct involving the parasagittal left frontal lobe/cingulate gyrus.  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  LDL 126.  A1c 5.3%. -Neurology on board.  Vascular surgery consulted as well. -Continue Plavix and aspirin. -Patient has history of statin intolerance.  May need referral to cardiology for PCSK9 inhibitors. -Encouraged smoking cessation. -Follow echocardiogram -PT/OT/SLP eval.  Bilateral carotid artery stenosis/left subclavian artery stenosis/left MCA aneurysm -Antiplatelets as above -Vascular surgery consulted  Essential hypertension: BP slightly elevated -Continue holding home Cardizem for permissive hypertension   Alcohol use disorder: EtOH level 41.  No withdrawal symptoms. -Continue CIWA with as needed Ativan -Continue multivitamin, thiamine and folic acid   Anxiety/insomnia -Continue home  regimen   Hyperlipidemia: LDL 126.  Reports statin intolerance.  Stop taking pravastatin -Needs referral to cardiology for PCSK9 inhibitors  Tobacco use disorder: Reports smoking half a pack a day -Encourage  cessation -Continue nicotine patch   Right foot pain after fall: X-ray of right foot negative for fracture or dislocation but hallux valgus with degenerative changes of the first metatarsophalangeal joint.  Exam reassuring. -Tylenol as needed   No charge service.  Body mass index is 26.89 kg/m.           DVT prophylaxis:  enoxaparin (LOVENOX) injection 40 mg Start: 07/23/23 2345  Code Status: Full code Family Communication: None at bedside Level of care: Telemetry Medical Status is: Inpatient Remains inpatient appropriate because: Acute stroke and carotid artery stenosis   Final disposition: Likely home once medically stable    Sch Meds:  Scheduled Meds:  aspirin EC  81 mg Oral Daily   atorvastatin  80 mg Oral Daily   clopidogrel  75 mg Oral Daily   enoxaparin (LOVENOX) injection  40 mg Subcutaneous Q24H   folic acid  1 mg Oral Daily   multivitamin with minerals  1 tablet Oral Daily   nicotine  14 mg Transdermal Daily   thiamine  100 mg Oral Daily   Or   thiamine  100 mg Intravenous Daily   Continuous Infusions:  lactated ringers 50 mL/hr at 07/24/23 0738   PRN Meds:.labetalol, LORazepam **OR** LORazepam  Antimicrobials: Anti-infectives (From admission, onward)    None        I have personally reviewed the following labs and images: CBC: Recent Labs  Lab 07/23/23 1930 07/24/23 0030  WBC 8.4 9.1  HGB 14.0 13.3  HCT 42.5 40.3  MCV 102.2* 101.5*  PLT 291 266   BMP &GFR Recent Labs  Lab 07/23/23 1930 07/24/23 0030  NA 136  --   K 3.7  --   CL 103  --   CO2 22  --   GLUCOSE 130*  --   BUN 10  --   CREATININE 0.91 0.96  CALCIUM 9.0  --    Estimated Creatinine Clearance: 49.5 mL/min (by C-G formula based on SCr of 0.96 mg/dL). Liver & Pancreas: Recent Labs  Lab 07/23/23 1930  AST 20  ALT 19  ALKPHOS 57  BILITOT 0.4  PROT 6.6  ALBUMIN 3.6   No results for input(s): "LIPASE", "AMYLASE" in the last 168 hours. No results for  input(s): "AMMONIA" in the last 168 hours. Diabetic: Recent Labs    07/24/23 0030  HGBA1C 5.3   No results for input(s): "GLUCAP" in the last 168 hours. Cardiac Enzymes: No results for input(s): "CKTOTAL", "CKMB", "CKMBINDEX", "TROPONINI" in the last 168 hours. No results for input(s): "PROBNP" in the last 8760 hours. Coagulation Profile: No results for input(s): "INR", "PROTIME" in the last 168 hours. Thyroid Function Tests: No results for input(s): "TSH", "T4TOTAL", "FREET4", "T3FREE", "THYROIDAB" in the last 72 hours. Lipid Profile: Recent Labs    07/24/23 0030  CHOL 227*  HDL 46  LDLCALC 126*  TRIG 274*  CHOLHDL 4.9   Anemia Panel: No results for input(s): "VITAMINB12", "FOLATE", "FERRITIN", "TIBC", "IRON", "RETICCTPCT" in the last 72 hours. Urine analysis:    Component Value Date/Time   COLORURINE YELLOW 10/25/2013 1128   APPEARANCEUR CLEAR 10/25/2013 1128   LABSPEC 1.002 (L) 10/25/2013 1128   PHURINE 5.5 10/25/2013 1128   GLUCOSEU NEGATIVE 10/25/2013 1128   HGBUR NEGATIVE 10/25/2013 1128   BILIRUBINUR NEGATIVE 10/25/2013 1128  KETONESUR NEGATIVE 10/25/2013 1128   PROTEINUR NEGATIVE 10/25/2013 1128   UROBILINOGEN 0.2 10/25/2013 1128   NITRITE NEGATIVE 10/25/2013 1128   LEUKOCYTESUR NEGATIVE 10/25/2013 1128   Sepsis Labs: Invalid input(s): "PROCALCITONIN", "LACTICIDVEN"  Microbiology: No results found for this or any previous visit (from the past 240 hours).  Radiology Studies: MR BRAIN WO CONTRAST Result Date: 07/24/2023 CLINICAL DATA:  Follow-up examination for acute stroke. EXAM: MRI HEAD WITHOUT CONTRAST TECHNIQUE: Multiplanar, multiecho pulse sequences of the brain and surrounding structures were obtained without intravenous contrast. COMPARISON:  Comparison made with prior CTA from earlier same day as well as prior CT from 07/23/2023. FINDINGS: Brain: Cerebral volume within normal limits. Patchy T2/FLAIR hyperintensity involving the periventricular and  deep white matter both cerebral hemispheres, consistent with chronic small vessel ischemic disease, mild in nature. Few scattered remote lacunar infarcts present about the hemispheric cerebral white matter and thalami. 2.3 cm focus of restricted diffusion involving the parasagittal left frontal lobe/cingulate gyrus, consistent with an of acute left ACA distribution infarct (series 5, image 90). No associated hemorrhage or mass effect. No other evidence for acute or subacute ischemia. Gray-white matter differentiation otherwise maintained. No acute or chronic intracranial blood products. No mass lesion, midline shift or mass effect. No hydrocephalus or extra-axial fluid collection. Pituitary gland within normal limits. Vascular: Major intracranial vascular flow voids are maintained. Skull and upper cervical spine: Craniocervical junction within normal limits. Bone marrow signal intensity normal. No scalp soft tissue abnormality. Sinuses/Orbits: Globes normal soft tissues within normal limits. Paranasal sinuses are largely clear. No mastoid effusion. Other: None. IMPRESSION: 1. 2.3 cm acute left ACA distribution infarct involving the parasagittal left frontal lobe/cingulate gyrus. No associated hemorrhage or mass effect. 2. Underlying mild chronic microvascular ischemic disease with a few scattered remote lacunar infarcts about the hemispheric cerebral white matter and thalami. Electronically Signed   By: Rise Mu M.D.   On: 07/24/2023 03:34   CT ANGIO HEAD NECK W WO CM Result Date: 07/24/2023 CLINICAL DATA:  Initial evaluation for acute neuro deficit, stroke suspected. EXAM: CT ANGIOGRAPHY HEAD AND NECK WITH AND WITHOUT CONTRAST TECHNIQUE: Multidetector CT imaging of the head and neck was performed using the standard protocol during bolus administration of intravenous contrast. Multiplanar CT image reconstructions and MIPs were obtained to evaluate the vascular anatomy. Carotid stenosis measurements  (when applicable) are obtained utilizing NASCET criteria, using the distal internal carotid diameter as the denominator. RADIATION DOSE REDUCTION: This exam was performed according to the departmental dose-optimization program which includes automated exposure control, adjustment of the mA and/or kV according to patient size and/or use of iterative reconstruction technique. CONTRAST:  75mL OMNIPAQUE IOHEXOL 350 MG/ML SOLN COMPARISON:  Prior CT from 07/23/2023. FINDINGS: CTA NECK FINDINGS Aortic arch: Visualized arch within normal limits for caliber with standard 3 vessel morphology. Advanced atheromatous change about the arch and origin of the great vessels. Associated 60% stenosis involving the proximal left subclavian artery (series 6, image 10). Right carotid system: Right common and internal carotid arteries are patent without dissection. Atheromatous change about the right carotid bulb with associated stenosis of up to 65% by NASCET criteria. Left carotid system: Left common and internal carotid arteries are patent without dissection. Bulky calcified plaque about the left carotid bulb with resultant severe near occlusive stenosis (series 7, image 138). A radiographic string sign is present. Vertebral arteries: Both vertebral arteries arise from subclavian arteries. Atheromatous change at the origin of the right vertebral artery with mild stenosis. Vertebral arteries otherwise patent  without stenosis or dissection. Skeleton: No discrete or worrisome osseous lesions. Other neck: No other acute finding. Upper chest: No other acute finding. Review of the MIP images confirms the above findings CTA HEAD FINDINGS Anterior circulation: Atheromatous change about the carotid siphons with no more than mild multifocal narrowing bilaterally. Left A1 segment patent. Normal anterior communicating artery complex. Anterior cerebral arteries patent without stenosis. No M1 stenosis or occlusion. 3 mm saccular aneurysm seen  extending inferiorly from the left MCA bifurcation. Distal MCA branches perfused and fairly symmetric. Posterior circulation: Right vertebral artery slightly dominant. Atheromatous plaque within the mid right V4 segment with short-segment mild stenosis. Left V4 segment widely patent. Left PICA patent. Right PICA not well seen. Basilar widely patent without stenosis. Superior cerebellar and posterior cerebral arteries patent bilaterally. Venous sinuses: Patent allowing for timing the contrast bolus. Anatomic variants: As above. Review of the MIP images confirms the above findings IMPRESSION: 1. Negative CTA for acute large vessel occlusion or other emergent finding. 2. Bulky calcified plaque about the left carotid bulb with resultant severe near occlusive stenosis. 3. Atheromatous change about the right carotid bulb with associated stenosis of up to 65% by NASCET criteria. 4. 60% stenosis involving the proximal left subclavian artery. 5. 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 6.  Aortic Atherosclerosis (ICD10-I70.0). Electronically Signed   By: Rise Mu M.D.   On: 07/24/2023 01:53   CT HEAD WO CONTRAST Result Date: 07/23/2023 CLINICAL DATA:  Transient ischemic attack. Left facial droop and right-sided weakness, now resolved. EXAM: CT HEAD WITHOUT CONTRAST TECHNIQUE: Contiguous axial images were obtained from the base of the skull through the vertex without intravenous contrast. RADIATION DOSE REDUCTION: This exam was performed according to the departmental dose-optimization program which includes automated exposure control, adjustment of the mA and/or kV according to patient size and/or use of iterative reconstruction technique. COMPARISON:  None Available. FINDINGS: Brain: Small area of decreased density and loss of gray-white differentiation in the posterior right frontal lobe, series 3, image 22. This may represent an area of subacute ischemia. No mass effect. No  intracranial hemorrhage. No hydrocephalus. Brain volume is normal for age. No subdural or extra-axial collection. No midline shift or evidence of mass lesion. Vascular: Atherosclerosis of skullbase vasculature without hyperdense vessel or abnormal calcification. Skull: No fracture or focal lesion. Sinuses/Orbits: Paranasal sinuses and mastoid air cells are clear. The visualized orbits are unremarkable. Other: None. IMPRESSION: Small area of decreased density and loss of gray-white differentiation in the posterior right frontal lobe, may represent an area of subacute ischemia. Recommend MRI for further assessment Electronically Signed   By: Narda Rutherford M.D.   On: 07/23/2023 21:09   DG Foot Complete Right Result Date: 07/23/2023 CLINICAL DATA:  Right foot pain after fall. EXAM: RIGHT FOOT COMPLETE - 3+ VIEW COMPARISON:  None Available. FINDINGS: There is no evidence of fracture or dislocation. Hallux valgus with degenerative change of the first metatarsal phalangeal joint. Soft tissues are unremarkable. IMPRESSION: 1. No fracture or dislocation of the right foot. 2. Hallux valgus with degenerative change of the first metatarsophalangeal joint. Electronically Signed   By: Narda Rutherford M.D.   On: 07/23/2023 21:02   DG Hip Unilat W or Wo Pelvis 2-3 Views Right Result Date: 07/23/2023 CLINICAL DATA:  Pain after fall. EXAM: DG HIP (WITH OR WITHOUT PELVIS) 2-3V RIGHT COMPARISON:  05/24/2022 FINDINGS: ORIF of right proximal femur fracture. No periprosthetic lucency. No evidence of acute fracture of the pelvis  or right hip. Pubic rami are intact. Pubic symphysis and sacroiliac joints are congruent. IMPRESSION: 1. No acute fracture of the pelvis or right hip. 2. ORIF of remote right proximal femur fracture. No hardware complication. Electronically Signed   By: Narda Rutherford M.D.   On: 07/23/2023 21:00      Tasmin Exantus T. Jasalyn Frysinger [facility]  If 7PM-7AM, please contact  night-coverage [email or web address] 07/24/2023, 10:58 AM

## 2023-07-24 NOTE — Progress Notes (Signed)
 STROKE TEAM PROGRESS NOTE   SUBJECTIVE (INTERVAL HISTORY) Her daughter is at the bedside.  Overall her condition is completely resolved.  Per daughter, patient had several days ago right leg gave out and fell.  Yesterday she had right arm weakness and right facial droop.  Currently all resolved.  CTA showed left ICA bulb severe stenosis.  Vascular surgery on board.   OBJECTIVE Temp:  [97.5 F (36.4 C)-98.1 F (36.7 C)] 97.8 F (36.6 C) (03/20 1512) Pulse Rate:  [86-107] 99 (03/20 1512) Cardiac Rhythm: Normal sinus rhythm (03/19 2333) Resp:  [10-24] 18 (03/20 1512) BP: (105-167)/(47-94) 148/78 (03/20 1512) SpO2:  [90 %-100 %] 99 % (03/20 1512) Weight:  [66.7 kg] 66.7 kg (03/19 1927)  No results for input(s): "GLUCAP" in the last 168 hours. Recent Labs  Lab 07/23/23 1930 07/24/23 0030  NA 136  --   K 3.7  --   CL 103  --   CO2 22  --   GLUCOSE 130*  --   BUN 10  --   CREATININE 0.91 0.96  CALCIUM 9.0  --    Recent Labs  Lab 07/23/23 1930  AST 20  ALT 19  ALKPHOS 57  BILITOT 0.4  PROT 6.6  ALBUMIN 3.6   Recent Labs  Lab 07/23/23 1930 07/24/23 0030  WBC 8.4 9.1  HGB 14.0 13.3  HCT 42.5 40.3  MCV 102.2* 101.5*  PLT 291 266   No results for input(s): "CKTOTAL", "CKMB", "CKMBINDEX", "TROPONINI" in the last 168 hours. No results for input(s): "LABPROT", "INR" in the last 72 hours. No results for input(s): "COLORURINE", "LABSPEC", "PHURINE", "GLUCOSEU", "HGBUR", "BILIRUBINUR", "KETONESUR", "PROTEINUR", "UROBILINOGEN", "NITRITE", "LEUKOCYTESUR" in the last 72 hours.  Invalid input(s): "APPERANCEUR"     Component Value Date/Time   CHOL 227 (H) 07/24/2023 0030   TRIG 274 (H) 07/24/2023 0030   HDL 46 07/24/2023 0030   CHOLHDL 4.9 07/24/2023 0030   VLDL 55 (H) 07/24/2023 0030   LDLCALC 126 (H) 07/24/2023 0030   Lab Results  Component Value Date   HGBA1C 5.3 07/24/2023      Component Value Date/Time   LABOPIA NONE DETECTED 07/23/2023 1930   COCAINSCRNUR NONE  DETECTED 07/23/2023 1930   LABBENZ NONE DETECTED 07/23/2023 1930   AMPHETMU NONE DETECTED 07/23/2023 1930   THCU NONE DETECTED 07/23/2023 1930   LABBARB NONE DETECTED 07/23/2023 1930    Recent Labs  Lab 07/23/23 1930  ETH 41*    I have personally reviewed the radiological images below and agree with the radiology interpretations.  ECHOCARDIOGRAM COMPLETE Result Date: 07/24/2023    ECHOCARDIOGRAM REPORT   Patient Name:   Kristin Schmidt Date of Exam: 07/24/2023 Medical Rec #:  [id number]              Height:       62.0 in Accession #:    [id number]             Weight:       147.0 lb Date of Birth:  08-Jul-1955               BSA:          1.677 m Patient Age:    67 years               BP:           162/82 mmHg Patient Gender: F  HR:           91 bpm. Exam Location:  Inpatient Procedure: 2D Echo, Color Doppler, Cardiac Doppler and Intracardiac            Opacification Agent (Both Spectral and Color Flow Doppler were            utilized during procedure). Indications:    TIA G45.9  History:        Patient has prior history of Echocardiogram examinations, most                 recent 04/16/2019.  Sonographer:    Harriette Bouillon RDCS Referring Phys: [id number] Oliver Pila HALL  Sonographer Comments: Technically difficult study due to poor echo windows. IMPRESSIONS  1. Left ventricular ejection fraction, by estimation, is 60 to 65%. The left ventricle has normal function. The left ventricle has no regional wall motion abnormalities. Diastolic function could not be evaluated due to suboptimal images.  2. Right ventricular systolic function is normal. The right ventricular size is normal. Mildly increased right ventricular wall thickness.  3. The mitral valve is normal in structure. Trivial mitral valve regurgitation. No evidence of mitral stenosis.  4. The aortic valve was not well visualized. Aortic valve regurgitation is not visualized.  5. The inferior vena cava is normal in size with greater  than 50% respiratory variability, suggesting right atrial pressure of 3 mmHg. Conclusion(s)/Recommendation(s): No intracardiac source of embolism detected on this transthoracic study. Consider a transesophageal echocardiogram to exclude cardiac source of embolism if clinically indicated. FINDINGS  Left Ventricle: Left ventricular ejection fraction, by estimation, is 60 to 65%. The left ventricle has normal function. The left ventricle has no regional wall motion abnormalities. The left ventricular internal cavity size was normal in size. There is  no left ventricular hypertrophy. Diastolic function could not be evaluated due to suboptimal images. Right Ventricle: The right ventricular size is normal. Mildly increased right ventricular wall thickness. Right ventricular systolic function is normal. Left Atrium: Left atrial size was normal in size. Right Atrium: Right atrial size was normal in size. Pericardium: There is no evidence of pericardial effusion. Mitral Valve: The mitral valve is normal in structure. Trivial mitral valve regurgitation. No evidence of mitral valve stenosis. Tricuspid Valve: The tricuspid valve is grossly normal. Tricuspid valve regurgitation is not demonstrated. No evidence of tricuspid stenosis. Aortic Valve: The aortic valve was not well visualized. Aortic valve regurgitation is not visualized. Pulmonic Valve: The pulmonic valve was not well visualized. Pulmonic valve regurgitation is not visualized. No evidence of pulmonic stenosis. Aorta: The aortic root and ascending aorta are structurally normal, with no evidence of dilitation. Venous: The inferior vena cava is normal in size with greater than 50% respiratory variability, suggesting right atrial pressure of 3 mmHg. IAS/Shunts: The atrial septum is grossly normal.  LEFT VENTRICLE PLAX 2D LVIDd:         4.40 cm   Diastology LVIDs:         2.90 cm   LV e' medial:  7.29 cm/s LV PW:         0.90 cm   LV e' lateral: 6.74 cm/s LV IVS:         0.90 cm LVOT diam:     2.00 cm LV SV:         41 LV SV Index:   24 LVOT Area:     3.14 cm  IVC IVC diam: 1.50 cm LEFT ATRIUM  Index LA diam:      3.40 cm 2.03 cm/m LA Vol (A4C): 25.5 ml 15.20 ml/m  AORTIC VALVE LVOT Vmax:   63.90 cm/s LVOT Vmean:  39.300 cm/s LVOT VTI:    0.130 m  AORTA Ao Root diam: 2.20 cm Ao Asc diam:  2.50 cm  SHUNTS Systemic VTI:  0.13 m Systemic Diam: 2.00 cm Sunit Tolia Electronically signed by Tessa Lerner Signature Date/Time: 07/24/2023/4:15:21 PM    Final    MR BRAIN WO CONTRAST Result Date: 07/24/2023 CLINICAL DATA:  Follow-up examination for acute stroke. EXAM: MRI HEAD WITHOUT CONTRAST TECHNIQUE: Multiplanar, multiecho pulse sequences of the brain and surrounding structures were obtained without intravenous contrast. COMPARISON:  Comparison made with prior CTA from earlier same day as well as prior CT from 07/23/2023. FINDINGS: Brain: Cerebral volume within normal limits. Patchy T2/FLAIR hyperintensity involving the periventricular and deep white matter both cerebral hemispheres, consistent with chronic small vessel ischemic disease, mild in nature. Few scattered remote lacunar infarcts present about the hemispheric cerebral white matter and thalami. 2.3 cm focus of restricted diffusion involving the parasagittal left frontal lobe/cingulate gyrus, consistent with an of acute left ACA distribution infarct (series 5, image 90). No associated hemorrhage or mass effect. No other evidence for acute or subacute ischemia. Gray-white matter differentiation otherwise maintained. No acute or chronic intracranial blood products. No mass lesion, midline shift or mass effect. No hydrocephalus or extra-axial fluid collection. Pituitary gland within normal limits. Vascular: Major intracranial vascular flow voids are maintained. Skull and upper cervical spine: Craniocervical junction within normal limits. Bone marrow signal intensity normal. No scalp soft tissue abnormality.  Sinuses/Orbits: Globes normal soft tissues within normal limits. Paranasal sinuses are largely clear. No mastoid effusion. Other: None. IMPRESSION: 1. 2.3 cm acute left ACA distribution infarct involving the parasagittal left frontal lobe/cingulate gyrus. No associated hemorrhage or mass effect. 2. Underlying mild chronic microvascular ischemic disease with a few scattered remote lacunar infarcts about the hemispheric cerebral white matter and thalami. Electronically Signed   By: Rise Mu M.D.   On: 07/24/2023 03:34   CT ANGIO HEAD NECK W WO CM Result Date: 07/24/2023 CLINICAL DATA:  Initial evaluation for acute neuro deficit, stroke suspected. EXAM: CT ANGIOGRAPHY HEAD AND NECK WITH AND WITHOUT CONTRAST TECHNIQUE: Multidetector CT imaging of the head and neck was performed using the standard protocol during bolus administration of intravenous contrast. Multiplanar CT image reconstructions and MIPs were obtained to evaluate the vascular anatomy. Carotid stenosis measurements (when applicable) are obtained utilizing NASCET criteria, using the distal internal carotid diameter as the denominator. RADIATION DOSE REDUCTION: This exam was performed according to the departmental dose-optimization program which includes automated exposure control, adjustment of the mA and/or kV according to patient size and/or use of iterative reconstruction technique. CONTRAST:  75mL OMNIPAQUE IOHEXOL 350 MG/ML SOLN COMPARISON:  Prior CT from 07/23/2023. FINDINGS: CTA NECK FINDINGS Aortic arch: Visualized arch within normal limits for caliber with standard 3 vessel morphology. Advanced atheromatous change about the arch and origin of the great vessels. Associated 60% stenosis involving the proximal left subclavian artery (series 6, image 10). Right carotid system: Right common and internal carotid arteries are patent without dissection. Atheromatous change about the right carotid bulb with associated stenosis of up to 65% by  NASCET criteria. Left carotid system: Left common and internal carotid arteries are patent without dissection. Bulky calcified plaque about the left carotid bulb with resultant severe near occlusive stenosis (series 7, image 138). A radiographic string sign is  present. Vertebral arteries: Both vertebral arteries arise from subclavian arteries. Atheromatous change at the origin of the right vertebral artery with mild stenosis. Vertebral arteries otherwise patent without stenosis or dissection. Skeleton: No discrete or worrisome osseous lesions. Other neck: No other acute finding. Upper chest: No other acute finding. Review of the MIP images confirms the above findings CTA HEAD FINDINGS Anterior circulation: Atheromatous change about the carotid siphons with no more than mild multifocal narrowing bilaterally. Left A1 segment patent. Normal anterior communicating artery complex. Anterior cerebral arteries patent without stenosis. No M1 stenosis or occlusion. 3 mm saccular aneurysm seen extending inferiorly from the left MCA bifurcation. Distal MCA branches perfused and fairly symmetric. Posterior circulation: Right vertebral artery slightly dominant. Atheromatous plaque within the mid right V4 segment with short-segment mild stenosis. Left V4 segment widely patent. Left PICA patent. Right PICA not well seen. Basilar widely patent without stenosis. Superior cerebellar and posterior cerebral arteries patent bilaterally. Venous sinuses: Patent allowing for timing the contrast bolus. Anatomic variants: As above. Review of the MIP images confirms the above findings IMPRESSION: 1. Negative CTA for acute large vessel occlusion or other emergent finding. 2. Bulky calcified plaque about the left carotid bulb with resultant severe near occlusive stenosis. 3. Atheromatous change about the right carotid bulb with associated stenosis of up to 65% by NASCET criteria. 4. 60% stenosis involving the proximal left subclavian artery. 5.  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 6.  Aortic Atherosclerosis (ICD10-I70.0). Electronically Signed   By: Rise Mu M.D.   On: 07/24/2023 01:53   CT HEAD WO CONTRAST Result Date: 07/23/2023 CLINICAL DATA:  Transient ischemic attack. Left facial droop and right-sided weakness, now resolved. EXAM: CT HEAD WITHOUT CONTRAST TECHNIQUE: Contiguous axial images were obtained from the base of the skull through the vertex without intravenous contrast. RADIATION DOSE REDUCTION: This exam was performed according to the departmental dose-optimization program which includes automated exposure control, adjustment of the mA and/or kV according to patient size and/or use of iterative reconstruction technique. COMPARISON:  None Available. FINDINGS: Brain: Small area of decreased density and loss of gray-white differentiation in the posterior right frontal lobe, series 3, image 22. This may represent an area of subacute ischemia. No mass effect. No intracranial hemorrhage. No hydrocephalus. Brain volume is normal for age. No subdural or extra-axial collection. No midline shift or evidence of mass lesion. Vascular: Atherosclerosis of skullbase vasculature without hyperdense vessel or abnormal calcification. Skull: No fracture or focal lesion. Sinuses/Orbits: Paranasal sinuses and mastoid air cells are clear. The visualized orbits are unremarkable. Other: None. IMPRESSION: Small area of decreased density and loss of gray-white differentiation in the posterior right frontal lobe, may represent an area of subacute ischemia. Recommend MRI for further assessment Electronically Signed   By: Narda Rutherford M.D.   On: 07/23/2023 21:09   DG Foot Complete Right Result Date: 07/23/2023 CLINICAL DATA:  Right foot pain after fall. EXAM: RIGHT FOOT COMPLETE - 3+ VIEW COMPARISON:  None Available. FINDINGS: There is no evidence of fracture or dislocation. Hallux valgus with degenerative change  of the first metatarsal phalangeal joint. Soft tissues are unremarkable. IMPRESSION: 1. No fracture or dislocation of the right foot. 2. Hallux valgus with degenerative change of the first metatarsophalangeal joint. Electronically Signed   By: Narda Rutherford M.D.   On: 07/23/2023 21:02   DG Hip Unilat W or Wo Pelvis 2-3 Views Right Result Date: 07/23/2023 CLINICAL DATA:  Pain after fall. EXAM: DG HIP (  WITH OR WITHOUT PELVIS) 2-3V RIGHT COMPARISON:  05/24/2022 FINDINGS: ORIF of right proximal femur fracture. No periprosthetic lucency. No evidence of acute fracture of the pelvis or right hip. Pubic rami are intact. Pubic symphysis and sacroiliac joints are congruent. IMPRESSION: 1. No acute fracture of the pelvis or right hip. 2. ORIF of remote right proximal femur fracture. No hardware complication. Electronically Signed   By: Narda Rutherford M.D.   On: 07/23/2023 21:00     PHYSICAL EXAM  Temp:  [97.5 F (36.4 C)-98.1 F (36.7 C)] 97.8 F (36.6 C) (03/20 1512) Pulse Rate:  [86-107] 99 (03/20 1512) Resp:  [10-24] 18 (03/20 1512) BP: (105-167)/(47-94) 148/78 (03/20 1512) SpO2:  [90 %-100 %] 99 % (03/20 1512) Weight:  [66.7 kg] 66.7 kg (03/19 1927)  General - Well nourished, well developed, in no apparent distress.  Ophthalmologic - fundi not visualized due to noncooperation.  Cardiovascular - Regular rhythm and rate.  Mental Status -  Level of arousal and orientation to time, place, and person were intact. Language including expression, naming, repetition, comprehension was assessed and found intact. Recent and remote memory were intact. Fund of Knowledge was assessed and was intact.  Cranial Nerves II - XII - II - Visual field intact OU. III, IV, VI - Extraocular movements intact. V - Facial sensation intact bilaterally. VII - Facial movement intact bilaterally. VIII - Hearing & vestibular intact bilaterally. X - Palate elevates symmetrically. XI - Chin turning & shoulder shrug  intact bilaterally. XII - Tongue protrusion intact.  Motor Strength - The patient's strength was normal in all extremities and pronator drift was absent.  Bulk was normal and fasciculations were absent.   Motor Tone - Muscle tone was assessed at the neck and appendages and was normal.  Reflexes - The patient's reflexes were symmetrical in all extremities and she had no pathological reflexes.  Sensory - Light touch, temperature/pinprick were assessed and were symmetrical.    Coordination - The patient had normal movements in the hands and feet with no ataxia or dysmetria.  Tremor was absent.  Gait and Station - deferred.   ASSESSMENT/PLAN [name] is a 68 y.o. female with history of CAD, hypertension, smoker, alcohol use admitted for transient episode of right-sided weakness, right facial droop lasting 10 minutes. No TNK given due to symptom resolved.    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  Bilateral siphon atherosclerosis left more than right.  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 MRI left ACA infarct 2D Echo EF 60 to 65% LDL 126, TG 274 HgbA1c 5.3 UDS negative Lovenox for VTE prophylaxis aspirin 81 mg daily prior to admission, now on aspirin 81 mg daily and clopidogrel 75 mg daily DAPT.  Duration per vascular surgery. Patient counseled to be compliant with her antithrombotic medications Ongoing aggressive stroke risk factor management Therapy recommendations: None Disposition: Pending  Carotid stenosis CTA head and neck left ICA bulb severe stenosis, right ICA bulb 65% stenosis.  Vascular surgery on board Plan for carotid intervention, CEA versus TCAR Also need outpatient follow-up of right ICA monitoring  Hypertension Stable Avoid low BP BP goal 1 30-1  60 before carotid intervention Long term BP goal normotensive  Hyperlipidemia Home meds: None LDL 126, goal < 70 Now on Lipitor 80 Continue statin at discharge  Tobacco abuse Current smoker Smoking cessation counseling provided Nicotine patch provided Pt is willing to quit  Other Stroke Risk Factors Advanced age ETOH use, advised no more than 1 drink per day  Other Active Problems Cerebral aneurysm, CTA showed left MCA bifurcation 3 mm saccular aneurysm.  Need outpatient neurology follow-up and monitoring  Hospital day # 0    Marvel Plan, MD PhD Stroke Neurology 07/24/2023 4:31 PM    To contact Stroke Continuity provider, please refer to [email or web address]. After hours, contact General Neurology

## 2023-07-24 NOTE — Consult Note (Addendum)
 NEUROLOGY CONSULT NOTE   Date of service: July 24, 2023 Patient Name: Kristin Schmidt MRN:  [id number] DOB:  [birth year] Chief Complaint: "Right-sided weakness, facial droop" Requesting Provider: Darlin Drop, DO  History of Present Illness  Kristin Schmidt is a 68 y.o. female with hx of hypertension, coronary artery disease brought in for evaluation after family member noticed that she had a facial droop and right-sided weakness with last known well around 1800 hrs. on 07/23/2023, that only lasted a few minutes and then the symptoms resolved.  Of note, patient had 2 glasses of wine at that time-her daughter who is a nurse noticed that she was not able to get out of the sitting position and when the daughter helped her she was weaker on the right side.  The patient does not remember if she truly had any weakness.  The symptoms are transient but her daughter was concerned for strokelike presentation and brought her to the emergency department for further evaluation. Nonfocal exam by the EDP. Head CT with concern for a small hypodensity in the left frontal white matter. Neurology consulted for stroke/TIA workup  LKW: 1800 hrs. on 07/23/2023 Modified rankin score: 0-Completely asymptomatic and back to baseline post- stroke IV Thrombolysis: 7 resolved at the time of presentation EVT: Same as above NIHSS-0  ROS  Comprehensive ROS performed and pertinent positives documented in HPI    Past History   Past Medical History:  Diagnosis Date   Complication of anesthesia    Pt wakes up confused   Coronary artery disease 2007   renal artery stent and subclavian artery stent - [place]   Hypertension    Pneumonia    "walking pneumonia"   Renal disorder    Tachycardia    pt was treated with Carvedilol, taken off of it in May, 2018 due to feeling fatigued.    Past Surgical History:  Procedure Laterality Date   CESAREAN SECTION     FRACTURE SURGERY Left    leg   INTRAMEDULLARY  (IM) NAIL INTERTROCHANTERIC Right 05/24/2022   Procedure: INTRAMEDULLARY NAILING OF RIGHT FEMUR;  Surgeon: Roby Lofts, MD;  Location: [facility] OR;  Service: Orthopedics;  Laterality: Right;   ORIF HUMERUS FRACTURE Left 02/20/2017   Procedure: OPEN REDUCTION INTERNAL FIXATION (ORIF) LEFT PROXIMAL HUMERUS FRACTURE;  Surgeon: Francena Hanly, MD;  Location: [facility] OR;  Service: Orthopedics;  Laterality: Left;   stent placement right kidney       Family History: Family History  Problem Relation Age of Onset   COPD Mother    COPD Father    Heart attack Maternal Grandfather    Cancer Sister     Social History  reports that she quit smoking about 7 years ago. Her smoking use included cigarettes. She has never used smokeless tobacco. She reports current alcohol use. She reports that she does not use drugs.  Allergies  Allergen Reactions   Dilaudid [Hydromorphone Hcl] Other (See Comments)    Family request. Patient becomes heavily sedated and becomes unresponsive. Hypersensitive to dilaudid.   Penicillins Itching    HIVES 05/24/22 tolerated Cefazolin    Medications   Current Facility-Administered Medications:     stroke: early stages of recovery book, , Does not apply, Once, Hall, Carole N, DO   enoxaparin (LOVENOX) injection 40 mg, 40 mg, Subcutaneous, Q24H, Hall, Carole N, DO   folic acid (FOLVITE) tablet 1 mg, 1 mg, Oral, Daily, Hall, Carole N, DO   LORazepam (ATIVAN) tablet 1-4 mg, 1-4 mg, Oral, Q1H  PRN **OR** LORazepam (ATIVAN) injection 1-4 mg, 1-4 mg, Intravenous, Q1H PRN, Dow Adolph N, DO   multivitamin with minerals tablet 1 tablet, 1 tablet, Oral, Daily, Hall, Carole N, DO   thiamine (VITAMIN B1) tablet 100 mg, 100 mg, Oral, Daily **OR** thiamine (VITAMIN B1) injection 100 mg, 100 mg, Intravenous, Daily, Darlin Drop, DO  Current Outpatient Medications:    apixaban (ELIQUIS) 2.5 MG TABS tablet, Take 1 tablet (2.5 mg total) by mouth 2 (two) times daily., Disp: 60 tablet, Rfl: 0    Ascorbic Acid (VITA-C PO), Take 2 capsules by mouth daily., Disp: , Rfl:    Cholecalciferol (VITAMIN D3 PO), Take 1 capsule by mouth daily., Disp: , Rfl:    diltiazem (CARDIZEM CD) 120 MG 24 hr capsule, Take 1 capsule (120 mg total) by mouth daily., Disp: , Rfl:    escitalopram (LEXAPRO) 10 MG tablet, Take 10 mg by mouth at bedtime., Disp: , Rfl:    HYDROcodone-acetaminophen (NORCO/VICODIN) 5-325 MG tablet, Take 1-2 tablets by mouth every 4 (four) hours as needed for moderate pain (pain score 4-6)., Disp: 10 tablet, Rfl: 0   Magnesium Oxide 250 MG TABS, Take 250 mg by mouth at bedtime as needed (sleep)., Disp: , Rfl:    melatonin 5 MG TABS, Take 5 mg by mouth at bedtime as needed (sleep)., Disp: , Rfl:    methocarbamol (ROBAXIN) 500 MG tablet, Take 1 tablet (500 mg total) by mouth every 6 (six) hours as needed for muscle spasms., Disp: , Rfl:    metoprolol tartrate (LOPRESSOR) 25 MG tablet, TAKE 1 TABLET BY MOUTH 2 (TWO) TIMES DAILY AS NEEDED (IF TOP BLOOD PRESSURE IS OVER 160)., Disp: 180 tablet, Rfl: 3   Multiple Vitamins-Minerals (ZINC PO), Take 1 capsule by mouth daily., Disp: , Rfl:    nicotine (NICODERM CQ - DOSED IN MG/24 HOURS) 21 mg/24hr patch, Place 21 mg onto the skin daily as needed (for smoking)., Disp: , Rfl:    TURMERIC PO, Take 1 tablet by mouth daily., Disp: , Rfl:    zolpidem (AMBIEN) 5 MG tablet, Take 5 mg by mouth at bedtime as needed for sleep., Disp: , Rfl:   Vitals   Vitals:   08/12/23 2145 08-12-2023 2200 Aug 12, 2023 2215 August 12, 2023 2238  BP: (!) 134/53 (!) 105/47 118/70   Pulse: 92 93 96   Resp: 17 18 20    Temp:    (!) 97.5 F (36.4 C)  TempSrc:    Oral  SpO2: 95% 94% 96%   Weight:      Height:        Body mass index is 26.89 kg/m.  Physical Exam  General: Awake alert in no distress HEENT: Normocephalic atraumatic Lungs: Clear Cardiovascular: Regular rhythm Abdomen nondistended nontender Neurological exam Awake alert oriented x 3 No dysarthria No evidence of  aphasia Cranial nerves II to XII intact Motor examination with no drift-symmetric 5/5 strength in all fours Sensation intact to light touch without extinction Coordination examination reveals no dysmetria.  Labs/Imaging/Neurodiagnostic studies   CBC:  Recent Labs  Lab 08/12/2023 1930  WBC 8.4  HGB 14.0  HCT 42.5  MCV 102.2*  PLT 291   Basic Metabolic Panel:  Lab Results  Component Value Date   NA 136 2023-08-12   K 3.7 08/12/23   CO2 22 Aug 12, 2023   GLUCOSE 130 (H) 08-12-2023   BUN 10 08/12/2023   CREATININE 0.91 08-12-23   CALCIUM 9.0 Aug 12, 2023   GFRNONAA >60 08-12-23   GFRAA 107 03/19/2019  Lipid Panel: No results found for: "LDLCALC" HgbA1c: No results found for: "HGBA1C" Urine Drug Screen:     Component Value Date/Time   LABOPIA NONE DETECTED 07/23/2023 1930   COCAINSCRNUR NONE DETECTED 07/23/2023 1930   LABBENZ NONE DETECTED 07/23/2023 1930   AMPHETMU NONE DETECTED 07/23/2023 1930   THCU NONE DETECTED 07/23/2023 1930   LABBARB NONE DETECTED 07/23/2023 1930    Alcohol Level     Component Value Date/Time   ETH 41 (H) 07/23/2023 1930    CT Head without contrast(Personally reviewed): Small area of decreased density and loss of gray-white differentiation in the posterior left frontal lobe-may represent an area of subacute ischemia.  Recommend MRI for further assessment.   CT angio Head and Neck with contrast(Personally reviewed): Ordered   MRI Brain(Personally reviewed): Ordered  ASSESSMENT   Dylan Ruotolo is a 68 y.o. female with above past medical history with transient episode of right-sided weakness and facial droop, which lasted a few minutes and then resolved.  No residual deficits at this time.  NIH stroke scale 0 Could have been a small TIA versus small stroke due to the abnormality seen on the CT head.  Impression: Evaluate for stroke versus TIA  RECOMMENDATIONS  Admit to hospitalist Frequent neurochecks Telemetry MRI  brain without contrast CT angiography head and neck 2D echo A1c Lipid panel-she has had a lot of myalgias with statins and does not want to take statins irrespective of the results. Aspirin 81 (medication list lists apixaban-was given for DVT prophylaxis after hip surgery-according to EDP not on that medication anymore.) PT OT Speech therapy Blood pressure goal-allow permissive hypertension-treat only if systolic blood pressure is greater than 220 on a as needed basis for the next 24 to 48 hours Plan relayed to Dr. Margo Aye Stroke team to follow ______________________________________________________________________    Signed, Milon Dikes, MD [facility]

## 2023-07-24 NOTE — ED Notes (Signed)
 Patient transported to MRI, she will go to RM 38 in yellow once MRI is completed, report has been given to receiving RN.

## 2023-07-24 NOTE — Evaluation (Signed)
 Physical Therapy Evaluation Patient Details Name: Kristin Schmidt MRN: [id number] DOB: 11-30-1955 Today's Date: 07/24/2023  History of Present Illness  68 year old F presenting with right-sided weakness and left facial droop noted by patient's daughter.  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  Some right foot pain but x-rays negative for fracture.  PMH of HTN, HLD, tachycardia, ETOH use, anxiety, insomnia and tobacco use disorder.  Clinical Impression  Pt is presenting close to baseline level of functioning. Prior to hospitalization pt had a RW but did not use it. Currently she is Mod I with RW for gait and sit to stand. Pt has supportive family. Currently pt is presenting at baseline level of functioning and no skilled physical therapy services recommended. Pt will be discharged from skilled physical therapy services at this time; please re-consult if further needs arise.                  Equipment Recommendations None recommended by PT     Functional Status Assessment Patient has not had a recent decline in their functional status     Precautions / Restrictions Precautions Precautions: Fall Recall of Precautions/Restrictions: Intact Precaution/Restrictions Comments: per Dr. Roda Shutters permissive hypertension Restrictions Weight Bearing Restrictions Per Provider Order: No      Mobility  Bed Mobility Overal bed mobility: Modified Independent Bed Mobility: Supine to Sit     Supine to sit: Modified independent (Device/Increase time)          Transfers Overall transfer level: Modified independent Equipment used: None, Rolling walker (2 wheels) Transfers: Sit to/from Stand Sit to Stand: Modified independent (Device/Increase time)           General transfer comment: Mod I with RW    Ambulation/Gait Ambulation/Gait assistance: Modified independent (Device/Increase  time) Gait Distance (Feet): 150 Feet Assistive device: Rolling walker (2 wheels) Gait Pattern/deviations: WFL(Within Functional Limits) Gait velocity: decreased slightly Gait velocity interpretation: 1.31 - 2.62 ft/sec, indicative of limited community ambulator      Modified Rankin (Stroke Patients Only) Modified Rankin (Stroke Patients Only) Pre-Morbid Rankin Score: No symptoms Modified Rankin: No significant disability     Balance Overall balance assessment: Mild deficits observed, not formally tested Sitting-balance support: Feet unsupported Sitting balance-Leahy Scale: Good     Standing balance support: During functional activity, Bilateral upper extremity supported, No upper extremity supported Standing balance-Leahy Scale: Fair Standing balance comment: no overt LOB         Pertinent Vitals/Pain Pain Assessment Pain Assessment: Faces Faces Pain Scale: Hurts a little bit Pain Location: left foot with weightbearing Pain Descriptors / Indicators: Discomfort Pain Intervention(s): Limited activity within patient's tolerance, Monitored during session    Home Living Family/patient expects to be discharged to:: Private residence Living Arrangements: Children (daughter works in nursing at [employer] so pt is alone for 12 hours 3-4 days.  Daughter states they will arrange 24 hour if needed.) Available Help at Discharge: Available PRN/intermittently Type of Home: Apartment Home Access: Stairs to enter   Entrance Stairs-Number of Steps: 1  can go up through another area with no step   Home Layout: One level Home Equipment: Shower seat;Grab bars - tub/shower;Rolling Walker (2 wheels);Rollator (4 wheels)      Prior Function Prior Level of Function : Independent/Modified Independent   Mobility Comments: Did not need assistive device prior to this       Extremity/Trunk Assessment   Upper Extremity Assessment Upper Extremity Assessment: Defer to  OT evaluation    Lower  Extremity Assessment Lower Extremity Assessment: Overall WFL for tasks assessed    Cervical / Trunk Assessment Cervical / Trunk Assessment: Normal  Communication   Communication Communication: No apparent difficulties    Cognition Arousal: Alert Behavior During Therapy: WFL for tasks assessed/performed     Following commands: Intact             General Comments General comments (skin integrity, edema, etc.): Family present initially than left during session. No signs/symptoms of cardiac/respirtory distress        Assessment/Plan    PT Assessment Patient does not need any further PT services         PT Goals (Current goals can be found in the Care Plan section)  Acute Rehab PT Goals PT Goal Formulation: All assessment and education complete, DC therapy     AM-PAC PT "6 Clicks" Mobility  Outcome Measure Help needed turning from your back to your side while in a flat bed without using bedrails?: None Help needed moving from lying on your back to sitting on the side of a flat bed without using bedrails?: None Help needed moving to and from a bed to a chair (including a wheelchair)?: None Help needed standing up from a chair using your arms (e.g., wheelchair or bedside chair)?: None Help needed to walk in hospital room?: None Help needed climbing 3-5 steps with a railing? : A Little 6 Click Score: 23    End of Session Equipment Utilized During Treatment: Gait belt Activity Tolerance: Patient tolerated treatment well Patient left: in bed;with call bell/phone within reach Nurse Communication: Mobility status      Time: 1610-9604 PT Time Calculation (min) (ACUTE ONLY): 13 min   Charges:   PT Evaluation $PT Eval Low Complexity: 1 Low   PT General Charges $$ ACUTE PT VISIT: 1 Visit        Harrel Carina, DPT, CLT  Acute Rehabilitation Services Office: [phone] (Secure chat preferred)   Claudia Desanctis 07/24/2023, 3:36 PM

## 2023-07-24 NOTE — ED Notes (Signed)
Pt transported back from MRI

## 2023-07-24 NOTE — Plan of Care (Signed)
  Problem: Education: Goal: Knowledge of disease or condition will improve Outcome: Progressing Goal: Knowledge of secondary prevention will improve (MUST DOCUMENT ALL) Outcome: Progressing Goal: Knowledge of patient specific risk factors will improve (DELETE if not current risk factor) Outcome: Progressing   Problem: Ischemic Stroke/TIA Tissue Perfusion: Goal: Complications of ischemic stroke/TIA will be minimized Outcome: Progressing   Problem: Coping: Goal: Will verbalize positive feelings about self Outcome: Progressing Goal: Will identify appropriate support needs Outcome: Progressing   Problem: Health Behavior/Discharge Planning: Goal: Ability to manage health-related needs will improve Outcome: Progressing Goal: Goals will be collaboratively established with patient/family Outcome: Progressing   Problem: Self-Care: Goal: Ability to participate in self-care as condition permits will improve Outcome: Progressing Goal: Verbalization of feelings and concerns over difficulty with self-care will improve Outcome: Progressing Goal: Ability to communicate needs accurately will improve Outcome: Progressing

## 2023-07-24 NOTE — ED Notes (Signed)
 MD, Alanda Slim notified that patients family would like an update

## 2023-07-25 DIAGNOSIS — Z87891 Personal history of nicotine dependence: Secondary | ICD-10-CM

## 2023-07-25 DIAGNOSIS — I4719 Other supraventricular tachycardia: Secondary | ICD-10-CM | POA: Diagnosis not present

## 2023-07-25 DIAGNOSIS — I671 Cerebral aneurysm, nonruptured: Secondary | ICD-10-CM | POA: Diagnosis not present

## 2023-07-25 DIAGNOSIS — I639 Cerebral infarction, unspecified: Secondary | ICD-10-CM

## 2023-07-25 DIAGNOSIS — I6523 Occlusion and stenosis of bilateral carotid arteries: Secondary | ICD-10-CM

## 2023-07-25 DIAGNOSIS — I771 Stricture of artery: Secondary | ICD-10-CM

## 2023-07-25 DIAGNOSIS — I63522 Cerebral infarction due to unspecified occlusion or stenosis of left anterior cerebral artery: Secondary | ICD-10-CM | POA: Diagnosis not present

## 2023-07-25 LAB — RENAL FUNCTION PANEL
Albumin: 3.1 g/dL — ABNORMAL LOW (ref 3.5–5.0)
Anion gap: 13 (ref 5–15)
BUN: 14 mg/dL (ref 8–23)
CO2: 24 mmol/L (ref 22–32)
Calcium: 9 mg/dL (ref 8.9–10.3)
Chloride: 103 mmol/L (ref 98–111)
Creatinine, Ser: 0.79 mg/dL (ref 0.44–1.00)
GFR, Estimated: >60 mL/min (ref >60)
Glucose, Bld: 105 mg/dL — ABNORMAL HIGH (ref 70–99)
Phosphorus: 4.3 mg/dL (ref 2.5–4.6)
Potassium: 3.8 mmol/L (ref 3.5–5.1)
Sodium: 140 mmol/L (ref 135–145)

## 2023-07-25 LAB — CBC
HCT: 38.3 % (ref 36.0–46.0)
Hemoglobin: 12.7 g/dL (ref 12.0–15.0)
MCH: 33 pg (ref 26.0–34.0)
MCHC: 33.2 g/dL (ref 30.0–36.0)
MCV: 99.5 fL (ref 80.0–100.0)
Platelets: 244 10*3/uL (ref 150–400)
RBC: 3.85 MIL/uL — ABNORMAL LOW (ref 3.87–5.11)
RDW: 12.6 % (ref 11.5–15.5)
WBC: 6.4 10*3/uL (ref 4.0–10.5)
nRBC: 0 % (ref 0.0–0.2)

## 2023-07-25 LAB — MAGNESIUM: Magnesium: 1.8 mg/dL (ref 1.7–2.4)

## 2023-07-25 NOTE — Plan of Care (Signed)

## 2023-07-25 NOTE — TOC CM/SW Note (Addendum)
 Transition of Care [facility]) - Inpatient Brief Assessment   Patient Details  Name: Kristin Schmidt MRN: [id number] Date of Birth: 20-Apr-1956  [facility] CM/SW Contact:    Kermit Balo, RN Phone Number: 07/25/2023, 9:53 AM   Clinical Narrative:  Plan is for CEA on Monday. No follow up per therapies.  Transition of Care Asessment: Insurance and Status: Insurance coverage has been reviewed Patient has primary care physician: Yes Home environment has been reviewed: from home with daughter   Prior/Current Home Services: No current home services Social Drivers of Health Review: SDOH reviewed no interventions necessary Readmission risk has been reviewed: Yes Transition of care needs: no transition of care needs at this time

## 2023-07-25 NOTE — Progress Notes (Addendum)
 STROKE TEAM PROGRESS NOTE   SUBJECTIVE (INTERVAL HISTORY) No family is at the bedside. patient lying in bed, no acute event overnight, vascular surgery planning for left CEA on Monday.   OBJECTIVE Temp:  [97.7 F (36.5 C)-98.3 F (36.8 C)] 98.1 F (36.7 C) (03/21 1706) Pulse Rate:  [86-109] 97 (03/21 1706) Cardiac Rhythm: Normal sinus rhythm (03/21 0745) Resp:  [16-18] 16 (03/21 1706) BP: (136-173)/(56-84) 136/58 (03/21 1706) SpO2:  [96 %-100 %] 100 % (03/21 1706)  No results for input(s): "GLUCAP" in the last 168 hours. Recent Labs  Lab 07/23/23 1930 07/24/23 0030 07/25/23 0546  NA 136  --  140  K 3.7  --  3.8  CL 103  --  103  CO2 22  --  24  GLUCOSE 130*  --  105*  BUN 10  --  14  CREATININE 0.91 0.96 0.79  CALCIUM 9.0  --  9.0  MG  --   --  1.8  PHOS  --   --  4.3   Recent Labs  Lab 07/23/23 1930 07/25/23 0546  AST 20  --   ALT 19  --   ALKPHOS 57  --   BILITOT 0.4  --   PROT 6.6  --   ALBUMIN 3.6 3.1*   Recent Labs  Lab 07/23/23 1930 07/24/23 0030 07/25/23 0546  WBC 8.4 9.1 6.4  HGB 14.0 13.3 12.7  HCT 42.5 40.3 38.3  MCV 102.2* 101.5* 99.5  PLT 291 266 244   No results for input(s): "CKTOTAL", "CKMB", "CKMBINDEX", "TROPONINI" in the last 168 hours. No results for input(s): "LABPROT", "INR" in the last 72 hours. No results for input(s): "COLORURINE", "LABSPEC", "PHURINE", "GLUCOSEU", "HGBUR", "BILIRUBINUR", "KETONESUR", "PROTEINUR", "UROBILINOGEN", "NITRITE", "LEUKOCYTESUR" in the last 72 hours.  Invalid input(s): "APPERANCEUR"     Component Value Date/Time   CHOL 227 (H) 07/24/2023 0030   TRIG 274 (H) 07/24/2023 0030   HDL 46 07/24/2023 0030   CHOLHDL 4.9 07/24/2023 0030   VLDL 55 (H) 07/24/2023 0030   LDLCALC 126 (H) 07/24/2023 0030   Lab Results  Component Value Date   HGBA1C 5.3 07/24/2023      Component Value Date/Time   LABOPIA NONE DETECTED 07/23/2023 1930   COCAINSCRNUR NONE DETECTED 07/23/2023 1930   LABBENZ NONE DETECTED  07/23/2023 1930   AMPHETMU NONE DETECTED 07/23/2023 1930   THCU NONE DETECTED 07/23/2023 1930   LABBARB NONE DETECTED 07/23/2023 1930    Recent Labs  Lab 07/23/23 1930  ETH 41*    I have personally reviewed the radiological images below and agree with the radiology interpretations.  ECHOCARDIOGRAM COMPLETE Result Date: 07/24/2023    ECHOCARDIOGRAM REPORT   Patient Name:   Kristin Schmidt Date of Exam: 07/24/2023 Medical Rec #:  [id number]              Height:       62.0 in Accession #:    [id number]             Weight:       147.0 lb Date of Birth:  01-Sep-1955               BSA:          1.677 m Patient Age:    68 years               BP:           162/82 mmHg Patient Gender: F  HR:           91 bpm. Exam Location:  Inpatient Procedure: 2D Echo, Color Doppler, Cardiac Doppler and Intracardiac            Opacification Agent (Both Spectral and Color Flow Doppler were            utilized during procedure). Indications:    TIA G45.9  History:        Patient has prior history of Echocardiogram examinations, most                 recent 04/16/2019.  Sonographer:    Harriette Bouillon RDCS Referring Phys: [id number] Oliver Pila HALL  Sonographer Comments: Technically difficult study due to poor echo windows. IMPRESSIONS  1. Left ventricular ejection fraction, by estimation, is 60 to 65%. The left ventricle has normal function. The left ventricle has no regional wall motion abnormalities. Diastolic function could not be evaluated due to suboptimal images.  2. Right ventricular systolic function is normal. The right ventricular size is normal. Mildly increased right ventricular wall thickness.  3. The mitral valve is normal in structure. Trivial mitral valve regurgitation. No evidence of mitral stenosis.  4. The aortic valve was not well visualized. Aortic valve regurgitation is not visualized.  5. The inferior vena cava is normal in size with greater than 50% respiratory variability, suggesting  right atrial pressure of 3 mmHg. Conclusion(s)/Recommendation(s): No intracardiac source of embolism detected on this transthoracic study. Consider a transesophageal echocardiogram to exclude cardiac source of embolism if clinically indicated. FINDINGS  Left Ventricle: Left ventricular ejection fraction, by estimation, is 60 to 65%. The left ventricle has normal function. The left ventricle has no regional wall motion abnormalities. The left ventricular internal cavity size was normal in size. There is  no left ventricular hypertrophy. Diastolic function could not be evaluated due to suboptimal images. Right Ventricle: The right ventricular size is normal. Mildly increased right ventricular wall thickness. Right ventricular systolic function is normal. Left Atrium: Left atrial size was normal in size. Right Atrium: Right atrial size was normal in size. Pericardium: There is no evidence of pericardial effusion. Mitral Valve: The mitral valve is normal in structure. Trivial mitral valve regurgitation. No evidence of mitral valve stenosis. Tricuspid Valve: The tricuspid valve is grossly normal. Tricuspid valve regurgitation is not demonstrated. No evidence of tricuspid stenosis. Aortic Valve: The aortic valve was not well visualized. Aortic valve regurgitation is not visualized. Pulmonic Valve: The pulmonic valve was not well visualized. Pulmonic valve regurgitation is not visualized. No evidence of pulmonic stenosis. Aorta: The aortic root and ascending aorta are structurally normal, with no evidence of dilitation. Venous: The inferior vena cava is normal in size with greater than 50% respiratory variability, suggesting right atrial pressure of 3 mmHg. IAS/Shunts: The atrial septum is grossly normal.  LEFT VENTRICLE PLAX 2D LVIDd:         4.40 cm   Diastology LVIDs:         2.90 cm   LV e' medial:  7.29 cm/s LV PW:         0.90 cm   LV e' lateral: 6.74 cm/s LV IVS:        0.90 cm LVOT diam:     2.00 cm LV SV:          41 LV SV Index:   24 LVOT Area:     3.14 cm  IVC IVC diam: 1.50 cm LEFT ATRIUM  Index LA diam:      3.40 cm 2.03 cm/m LA Vol (A4C): 25.5 ml 15.20 ml/m  AORTIC VALVE LVOT Vmax:   63.90 cm/s LVOT Vmean:  39.300 cm/s LVOT VTI:    0.130 m  AORTA Ao Root diam: 2.20 cm Ao Asc diam:  2.50 cm  SHUNTS Systemic VTI:  0.13 m Systemic Diam: 2.00 cm Sunit Tolia Electronically signed by Tessa Lerner Signature Date/Time: 07/24/2023/4:15:21 PM    Final    MR BRAIN WO CONTRAST Result Date: 07/24/2023 CLINICAL DATA:  Follow-up examination for acute stroke. EXAM: MRI HEAD WITHOUT CONTRAST TECHNIQUE: Multiplanar, multiecho pulse sequences of the brain and surrounding structures were obtained without intravenous contrast. COMPARISON:  Comparison made with prior CTA from earlier same day as well as prior CT from 07/23/2023. FINDINGS: Brain: Cerebral volume within normal limits. Patchy T2/FLAIR hyperintensity involving the periventricular and deep white matter both cerebral hemispheres, consistent with chronic small vessel ischemic disease, mild in nature. Few scattered remote lacunar infarcts present about the hemispheric cerebral white matter and thalami. 2.3 cm focus of restricted diffusion involving the parasagittal left frontal lobe/cingulate gyrus, consistent with an of acute left ACA distribution infarct (series 5, image 90). No associated hemorrhage or mass effect. No other evidence for acute or subacute ischemia. Gray-white matter differentiation otherwise maintained. No acute or chronic intracranial blood products. No mass lesion, midline shift or mass effect. No hydrocephalus or extra-axial fluid collection. Pituitary gland within normal limits. Vascular: Major intracranial vascular flow voids are maintained. Skull and upper cervical spine: Craniocervical junction within normal limits. Bone marrow signal intensity normal. No scalp soft tissue abnormality. Sinuses/Orbits: Globes normal soft tissues within normal  limits. Paranasal sinuses are largely clear. No mastoid effusion. Other: None. IMPRESSION: 1. 2.3 cm acute left ACA distribution infarct involving the parasagittal left frontal lobe/cingulate gyrus. No associated hemorrhage or mass effect. 2. Underlying mild chronic microvascular ischemic disease with a few scattered remote lacunar infarcts about the hemispheric cerebral white matter and thalami. Electronically Signed   By: Rise Mu M.D.   On: 07/24/2023 03:34   CT ANGIO HEAD NECK W WO CM Result Date: 07/24/2023 CLINICAL DATA:  Initial evaluation for acute neuro deficit, stroke suspected. EXAM: CT ANGIOGRAPHY HEAD AND NECK WITH AND WITHOUT CONTRAST TECHNIQUE: Multidetector CT imaging of the head and neck was performed using the standard protocol during bolus administration of intravenous contrast. Multiplanar CT image reconstructions and MIPs were obtained to evaluate the vascular anatomy. Carotid stenosis measurements (when applicable) are obtained utilizing NASCET criteria, using the distal internal carotid diameter as the denominator. RADIATION DOSE REDUCTION: This exam was performed according to the departmental dose-optimization program which includes automated exposure control, adjustment of the mA and/or kV according to patient size and/or use of iterative reconstruction technique. CONTRAST:  75mL OMNIPAQUE IOHEXOL 350 MG/ML SOLN COMPARISON:  Prior CT from 07/23/2023. FINDINGS: CTA NECK FINDINGS Aortic arch: Visualized arch within normal limits for caliber with standard 3 vessel morphology. Advanced atheromatous change about the arch and origin of the great vessels. Associated 60% stenosis involving the proximal left subclavian artery (series 6, image 10). Right carotid system: Right common and internal carotid arteries are patent without dissection. Atheromatous change about the right carotid bulb with associated stenosis of up to 65% by NASCET criteria. Left carotid system: Left common and  internal carotid arteries are patent without dissection. Bulky calcified plaque about the left carotid bulb with resultant severe near occlusive stenosis (series 7, image 138). A radiographic string sign is  present. Vertebral arteries: Both vertebral arteries arise from subclavian arteries. Atheromatous change at the origin of the right vertebral artery with mild stenosis. Vertebral arteries otherwise patent without stenosis or dissection. Skeleton: No discrete or worrisome osseous lesions. Other neck: No other acute finding. Upper chest: No other acute finding. Review of the MIP images confirms the above findings CTA HEAD FINDINGS Anterior circulation: Atheromatous change about the carotid siphons with no more than mild multifocal narrowing bilaterally. Left A1 segment patent. Normal anterior communicating artery complex. Anterior cerebral arteries patent without stenosis. No M1 stenosis or occlusion. 3 mm saccular aneurysm seen extending inferiorly from the left MCA bifurcation. Distal MCA branches perfused and fairly symmetric. Posterior circulation: Right vertebral artery slightly dominant. Atheromatous plaque within the mid right V4 segment with short-segment mild stenosis. Left V4 segment widely patent. Left PICA patent. Right PICA not well seen. Basilar widely patent without stenosis. Superior cerebellar and posterior cerebral arteries patent bilaterally. Venous sinuses: Patent allowing for timing the contrast bolus. Anatomic variants: As above. Review of the MIP images confirms the above findings IMPRESSION: 1. Negative CTA for acute large vessel occlusion or other emergent finding. 2. Bulky calcified plaque about the left carotid bulb with resultant severe near occlusive stenosis. 3. Atheromatous change about the right carotid bulb with associated stenosis of up to 65% by NASCET criteria. 4. 60% stenosis involving the proximal left subclavian artery. 5. 3 mm saccular aneurysm extending inferiorly from the  left MCA bifurcation. 6.  Aortic Atherosclerosis (ICD10-I70.0). Electronically Signed   By: Rise Mu M.D.   On: 07/24/2023 01:53   CT HEAD WO CONTRAST Result Date: 07/23/2023 CLINICAL DATA:  Transient ischemic attack. Left facial droop and right-sided weakness, now resolved. EXAM: CT HEAD WITHOUT CONTRAST TECHNIQUE: Contiguous axial images were obtained from the base of the skull through the vertex without intravenous contrast. RADIATION DOSE REDUCTION: This exam was performed according to the departmental dose-optimization program which includes automated exposure control, adjustment of the mA and/or kV according to patient size and/or use of iterative reconstruction technique. COMPARISON:  None Available. FINDINGS: Brain: Small area of decreased density and loss of gray-white differentiation in the posterior right frontal lobe, series 3, image 22. This may represent an area of subacute ischemia. No mass effect. No intracranial hemorrhage. No hydrocephalus. Brain volume is normal for age. No subdural or extra-axial collection. No midline shift or evidence of mass lesion. Vascular: Atherosclerosis of skullbase vasculature without hyperdense vessel or abnormal calcification. Skull: No fracture or focal lesion. Sinuses/Orbits: Paranasal sinuses and mastoid air cells are clear. The visualized orbits are unremarkable. Other: None. IMPRESSION: Small area of decreased density and loss of gray-white differentiation in the posterior right frontal lobe, may represent an area of subacute ischemia. Recommend MRI for further assessment Electronically Signed   By: Narda Rutherford M.D.   On: 07/23/2023 21:09   DG Foot Complete Right Result Date: 07/23/2023 CLINICAL DATA:  Right foot pain after fall. EXAM: RIGHT FOOT COMPLETE - 3+ VIEW COMPARISON:  None Available. FINDINGS: There is no evidence of fracture or dislocation. Hallux valgus with degenerative change of the first metatarsal phalangeal joint. Soft  tissues are unremarkable. IMPRESSION: 1. No fracture or dislocation of the right foot. 2. Hallux valgus with degenerative change of the first metatarsophalangeal joint. Electronically Signed   By: Narda Rutherford M.D.   On: 07/23/2023 21:02   DG Hip Unilat W or Wo Pelvis 2-3 Views Right Result Date: 07/23/2023 CLINICAL DATA:  Pain after fall. EXAM: DG HIP (  WITH OR WITHOUT PELVIS) 2-3V RIGHT COMPARISON:  05/24/2022 FINDINGS: ORIF of right proximal femur fracture. No periprosthetic lucency. No evidence of acute fracture of the pelvis or right hip. Pubic rami are intact. Pubic symphysis and sacroiliac joints are congruent. IMPRESSION: 1. No acute fracture of the pelvis or right hip. 2. ORIF of remote right proximal femur fracture. No hardware complication. Electronically Signed   By: Narda Rutherford M.D.   On: 07/23/2023 21:00     PHYSICAL EXAM  Temp:  [97.7 F (36.5 C)-98.3 F (36.8 C)] 98.1 F (36.7 C) (03/21 1706) Pulse Rate:  [86-109] 97 (03/21 1706) Resp:  [16-18] 16 (03/21 1706) BP: (136-173)/(56-84) 136/58 (03/21 1706) SpO2:  [96 %-100 %] 100 % (03/21 1706)  General - Well nourished, well developed, in no apparent distress.  Ophthalmologic - fundi not visualized due to noncooperation.  Cardiovascular - Regular rhythm and rate.  Mental Status -  Level of arousal and orientation to time, place, and person were intact. Language including expression, naming, repetition, comprehension was assessed and found intact. Recent and remote memory were intact. Fund of Knowledge was assessed and was intact.  Cranial Nerves II - XII - II - Visual field intact OU. III, IV, VI - Extraocular movements intact. V - Facial sensation intact bilaterally. VII - Facial movement intact bilaterally. VIII - Hearing & vestibular intact bilaterally. X - Palate elevates symmetrically. XI - Chin turning & shoulder shrug intact bilaterally. XII - Tongue protrusion intact.  Motor Strength - The patient's  strength was normal in all extremities and pronator drift was absent.  Bulk was normal and fasciculations were absent.   Motor Tone - Muscle tone was assessed at the neck and appendages and was normal.  Reflexes - The patient's reflexes were symmetrical in all extremities and she had no pathological reflexes.  Sensory - Light touch, temperature/pinprick were assessed and were symmetrical.    Coordination - The patient had normal movements in the hands and feet with no ataxia or dysmetria.  Tremor was absent.  Gait and Station - deferred.   ASSESSMENT/PLAN [name] is a 68 y.o. female with history of CAD, hypertension, smoker, alcohol use admitted for transient episode of right-sided weakness, right facial droop lasting 10 minutes. No TNK given due to symptom resolved.    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  Bilateral siphon atherosclerosis left more than right.  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 MRI left ACA infarct 2D Echo EF 60 to 65% LDL 126, TG 274 HgbA1c 5.3 UDS negative Lovenox for VTE prophylaxis aspirin 81 mg daily prior to admission, now on aspirin 81 mg daily and clopidogrel 75 mg daily DAPT.  Duration per vascular surgery. Patient counseled to be compliant with her antithrombotic medications Ongoing aggressive stroke risk factor management Therapy recommendations: None Disposition: Pending  Carotid stenosis CTA head and neck left ICA bulb severe stenosis, right ICA bulb 65% stenosis.  Vascular surgery on board Plan for left CEA on Monday Also need outpatient follow-up of right ICA monitoring.  Hypertension Stable Avoid low BP BP goal 1 30-1 60 before carotid intervention Long term BP goal normotensive  Hyperlipidemia Home meds: None LDL  126, goal < 70 Now on Lipitor 80 Continue statin at discharge  Tobacco abuse Current smoker Smoking cessation counseling provided Nicotine  patch provided Pt is willing to quit  Other Stroke Risk Factors Advanced age ETOH use, advised no more than 1 drink per day  Other Active Problems Cerebral aneurysm, CTA showed left MCA bifurcation 3 mm saccular aneurysm.  Need outpatient neurology follow-up and monitoring  Hospital day # 1  Neurology will sign off. Please call with questions. Pt will follow up with stroke clinic NP at [facility] in about 4 weeks. Thanks for the consult.   Marvel Plan, MD PhD Stroke Neurology 07/25/2023 5:48 PM    To contact Stroke Continuity provider, please refer to [email or web address]. After hours, contact General Neurology

## 2023-07-25 NOTE — Evaluation (Signed)
 Speech Language Pathology Evaluation Patient Details Name: Kristin Schmidt MRN: [id number] DOB: June 15, 1955 Today's Date: 07/25/2023 Time: 6962-9528 SLP Time Calculation (min) (ACUTE ONLY): 20 min  Problem List:  Patient Active Problem List   Diagnosis Date Noted   Acute CVA (cerebrovascular accident) (HCC) 07/24/2023   Bilateral carotid artery stenosis 07/24/2023   Stenosis of left subclavian artery (HCC) 07/24/2023   Aneurysm of middle cerebral artery 07/24/2023   Stroke-like symptoms 07/23/2023   Other fracture of right femur, initial encounter for closed fracture (HCC) 05/24/2022   Hypotension 05/24/2022   CKD (chronic kidney disease), stage III (HCC) 05/24/2022   History of tobacco abuse 05/24/2022   Macrocytic anemia 05/24/2022   Leukocytosis 05/24/2022   Atrial tachycardia (HCC) 11/28/2021   Hypertension 11/28/2021   Unspecified fracture of upper end of left humerus, subsequent encounter for fracture with routine healing 02/20/2017   Hyperlipidemia 12/13/2015   Essential hypertension 09/11/2015   Past Medical History:  Past Medical History:  Diagnosis Date   Complication of anesthesia    Pt wakes up confused   Coronary artery disease 2007   renal artery stent and subclavian artery stent - [place]   Hypertension    Pneumonia    "walking pneumonia"   Renal disorder    Tachycardia    pt was treated with Carvedilol, taken off of it in May, 2018 due to feeling fatigued.   Past Surgical History:  Past Surgical History:  Procedure Laterality Date   CESAREAN SECTION     FRACTURE SURGERY Left    leg   INTRAMEDULLARY (IM) NAIL INTERTROCHANTERIC Right 05/24/2022   Procedure: INTRAMEDULLARY NAILING OF RIGHT FEMUR;  Surgeon: Roby Lofts, MD;  Location: [facility] OR;  Service: Orthopedics;  Laterality: Right;   ORIF HUMERUS FRACTURE Left 02/20/2017   Procedure: OPEN REDUCTION INTERNAL FIXATION (ORIF) LEFT PROXIMAL HUMERUS FRACTURE;  Surgeon: Francena Hanly, MD;  Location: [facility]  OR;  Service: Orthopedics;  Laterality: Left;   stent placement right kidney      HPI:  Pt is 68 yo presenting [facility] ED [facility] due to L facial droop and R sided weakness that did resolve. MRI revealed: 2.3 cm acute left ACA distribution infarct involving the parasagittal left frontal lobe/cingulate gyrus and underlying mild chronic microvascular ischemic disease with a few scattered remote lacunar infarcts about the hemispheric cerebral  white matter and thalami.  PMH: CAD, HTN, renal disorder, Tachycardia   Assessment / Plan / Recommendation Clinical Impression  Patient presents with cognitive linguistic function that is grossly within functional limits across all conducted tasks. Patient reports that compared to PTA, her thinking skills are the same. Daughter present to corroborate. Deficits reported by patient and daughter are limited to "slurred speech" and mild word finding deficits only when fatigued. Patient exhibits no motor speech nor receptive/expressive language deficits across all tasks. Patient scored WFL during SLUMS examination and independently naviagtes MyChart and interacts with MD re: medical information and plans for upcoming testing/procedures. No further SLP needs indicated. SLP will sign off.    SLP Assessment  SLP Recommendation/Assessment: Patient does not need any further Speech Lanaguage Pathology Services    Recommendations for follow up therapy are one component of a multi-disciplinary discharge planning process, led by the attending physician.  Recommendations may be updated based on patient status, additional functional criteria and insurance authorization.    Follow Up Recommendations  No SLP follow up    Assistance Recommended at Discharge  None  Functional Status Assessment Patient has not had  a recent decline in their functional status  Frequency and Duration           SLP Evaluation Cognition  Overall Cognitive Status: Within Functional Limits for tasks  assessed Arousal/Alertness: Awake/alert Orientation Level: Oriented X4 Year: 2025 Month: March Day of Week: Correct Attention: Sustained;Selective Sustained Attention: Appears intact Selective Attention: Appears intact Memory: Appears intact Awareness: Appears intact Problem Solving: Appears intact Executive Function: Organizing;Self Monitoring Organizing: Appears intact Self Monitoring: Appears intact Safety/Judgment: Appears intact       Comprehension  Auditory Comprehension Overall Auditory Comprehension: Appears within functional limits for tasks assessed Visual Recognition/Discrimination Discrimination: Within Function Limits Reading Comprehension Reading Status: Not tested    Expression Expression Primary Mode of Expression: Verbal Verbal Expression Overall Verbal Expression: Appears within functional limits for tasks assessed Written Expression Dominant Hand: Right Written Expression: Not tested   Oral / Motor  Oral Motor/Sensory Function Overall Oral Motor/Sensory Function: Within functional limits Motor Speech Overall Motor Speech: Appears within functional limits for tasks assessed Intelligibility: Intelligible           Kristin Schmidt, M.A., CCC-SLP  Kristin Schmidt 07/25/2023, 11:59 AM

## 2023-07-25 NOTE — Final Progress Note (Signed)
  Progress Note    07/25/2023 8:10 AM * No surgery found *  Subjective:  no concerns today. No new symptoms   Vitals:   07/24/23 2354 07/25/23 0354  BP: (!) 149/61 (!) 150/67  Pulse: (!) 109 96  Resp: 16 18  Temp: 98 F (36.7 C) 98 F (36.7 C)  SpO2: 99% 96%   Physical Exam: Cardiac:  regular Lungs:  non labored Extremities:  moving all extremities without deficits Neurologic: alert and oriented. Moving all extremities without deficits. Speech coherent. Smile symmetric. Tongue midline  CBC    Component Value Date/Time   WBC 6.4 07/25/2023 0546   RBC 3.85 (L) 07/25/2023 0546   HGB 12.7 07/25/2023 0546   HGB 13.6 03/19/2019 1335   HCT 38.3 07/25/2023 0546   HCT 39.1 03/19/2019 1335   PLT 244 07/25/2023 0546   PLT 323 03/19/2019 1335   MCV 99.5 07/25/2023 0546   MCV 94 03/19/2019 1335   MCH 33.0 07/25/2023 0546   MCHC 33.2 07/25/2023 0546   RDW 12.6 07/25/2023 0546   RDW 13.3 03/19/2019 1335   LYMPHSABS 2.2 10/25/2013 1121   MONOABS 0.7 10/25/2013 1121   EOSABS 0.0 10/25/2013 1121   BASOSABS 0.1 10/25/2013 1121    BMET    Component Value Date/Time   NA 140 07/25/2023 0546   NA 131 (L) 11/28/2021 0942   K 3.8 07/25/2023 0546   CL 103 07/25/2023 0546   CO2 24 07/25/2023 0546   GLUCOSE 105 (H) 07/25/2023 0546   BUN 14 07/25/2023 0546   BUN 22 11/28/2021 0942   CREATININE 0.79 07/25/2023 0546   CALCIUM 9.0 07/25/2023 0546   GFRNONAA >60 07/25/2023 0546   GFRAA 107 03/19/2019 1335    INR No results found for: "INR"   Intake/Output Summary (Last 24 hours) at 07/25/2023 0810 Last data filed at 07/25/2023 0431 Gross per 24 hour  Intake 1038.41 ml  Output --  Net 1038.41 ml     Assessment/Plan:  68 y.o. female with symptomatic left carotid artery stenosis  No new neurological symptoms Continue Aspirin, Statin, Plavix Plan is for right CEA on Monday with Dr. Chestine Spore. She is agreeable to proceed Reviewed surgery with her and answered her  questions She will be NPO after midnight on Sunday and consent order placed  Graceann Congress, PA-C [facility] [phone] 07/25/2023 8:10 AM

## 2023-07-25 NOTE — Plan of Care (Signed)
   Problem: Education: Goal: Knowledge of disease or condition will improve Outcome: Progressing   Problem: Ischemic Stroke/TIA Tissue Perfusion: Goal: Complications of ischemic stroke/TIA will be minimized Outcome: Progressing   Problem: Coping: Goal: Will verbalize positive feelings about self Outcome: Progressing Goal: Will identify appropriate support needs Outcome: Progressing   Problem: Health Behavior/Discharge Planning: Goal: Ability to manage health-related needs will improve Outcome: Progressing Goal: Goals will be collaboratively established with patient/family Outcome: Progressing

## 2023-07-25 NOTE — Progress Notes (Signed)
 PROGRESS NOTE  Kristin Schmidt ZOX:[id number] DOB: 14-May-1955   PCP: Eather Colas, FNP  Patient is from: Home.  Lives with daughter.  Independently ambulates at baseline.  DOA: 07/23/2023 LOS: 1  Chief complaints Chief Complaint  Patient presents with   Transient Ischemic Attack     Brief Narrative / Interim history: 68 year old F with PMH of HTN, HLD, tachycardia, EtOH use, anxiety, insomnia and tobacco use disorder presenting with right-sided weakness and left facial droop noted by patient's daughter, and admitted for CVA workup.  In ED, stable vitals.  Neurology consulted.  Noncontrast head CT showed small area of decreased density and loss of gray-white differentiation in the posterior right frontal lobe may represent an area of subacute ischemia.  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  No associated hemorrhage or mass effect.  Patient was started on Plavix and aspirin  The next day, vascular surgery consulted.  Plan for CEA on 3/24.   Subjective: Seen and examined earlier this morning.  No major events overnight of this morning.  No complaints.  Patient's daughter at bedside.  Objective: Vitals:   07/24/23 2354 07/25/23 0354 07/25/23 0829 07/25/23 1239  BP: (!) 149/61 (!) 150/67 138/66 (!) 138/56  Pulse: (!) 109 96 88 86  Resp: 16 18 16 16   Temp: 98 F (36.7 C) 98 F (36.7 C) 98 F (36.7 C) 97.7 F (36.5 C)  TempSrc: Oral  Oral Oral  SpO2: 99% 96% 96% 100%  Weight:      Height:        Examination:  GENERAL: No apparent distress.   Nontoxic. HEENT: MMM.  Vision and hearing grossly intact.  NECK: Supple.  No apparent JVD.  RESP:  No IWOB.  Fair aeration bilaterally. CVS:  RRR. Heart sounds normal.  ABD/GI/GU: BS+. Abd soft, NTND.  MSK/EXT:  Moves extremities. No apparent deformity. No edema.  SKIN: no apparent skin lesion or wound NEURO: Awake, alert and oriented appropriately.  Speech clear.  PERRL.  No facial asymmetry.  Motor 4/5 in RLE and 5/5 elsewhere.  Light sensation intact.  No pronator drift.  Finger-to-nose intact. PSYCH: Calm. Normal affect.   Consultants:  Neurology Vascular surgery  Procedures: None  Microbiology summarized: None  Assessment and plan: Acute ischemic CVA: Presented with right-sided weakness, left facial droop.  MRI revealed 2.3cm acute left ACA distribution infarct involving the parasagittal left frontal lobe/cingulate gyrus.  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  TTE without significant finding.  LDL 126.  A1c 5.3%. -Appreciate input by neurology-continue Plavix and aspirin. -Continue Plavix and aspirin. -Started on Lipitor on admission. -Encouraged smoking cessation. -Plan for CEA by vascular surgery -PT/OT/SLP eval.  Bilateral carotid artery stenosis/left subclavian artery stenosis/left MCA aneurysm -Antiplatelets as above -Vascular surgery on board.  Plan for CEA on 3/24.  Essential hypertension: BP slightly elevated -Continue holding home Cardizem for some permissive hypertension until CVA.   Alcohol use disorder: EtOH level 41.  No withdrawal symptoms. -Encouraged  moderation. -Continue CIWA with as needed Ativan -Continue multivitamin, thiamine and folic acid   Anxiety/insomnia -Continue home regimen   Hyperlipidemia: LDL 126.  Reports statin intolerance.  Stop taking pravastatin -Needs referral to  cardiology for PCSK9 inhibitors  Tobacco use disorder: Reports smoking half a pack a day -Encourage cessation -Continue nicotine patch   Right foot pain after fall: X-ray of right foot negative for fracture or dislocation but hallux valgus with degenerative changes of the first metatarsophalangeal joint.  Exam reassuring. -Tylenol as needed   Body mass index is 26.89 kg/m.           DVT prophylaxis:  enoxaparin (LOVENOX) injection 40 mg Start: 07/23/23 2345  Code Status: Full code Family Communication: Updated patient's daughter at bedside Level of care: Telemetry Medical Status is: Inpatient Remains inpatient appropriate because: Acute stroke and carotid artery stenosis   Final disposition: Likely home once medically stable  55 minutes with more than 50% spent in reviewing records, counseling patient/family and coordinating care.  Sch Meds:  Scheduled Meds:  aspirin EC  81 mg Oral Daily   atorvastatin  80 mg Oral Daily   clopidogrel  75 mg Oral Daily   enoxaparin (LOVENOX) injection  40 mg Subcutaneous Q24H   folic acid  1 mg Oral Daily   multivitamin with minerals  1 tablet Oral Daily   nicotine  14 mg Transdermal Daily   thiamine  100 mg Oral Daily   Or   thiamine  100 mg Intravenous Daily   Continuous Infusions:   PRN Meds:.labetalol, LORazepam **OR** LORazepam, traZODone  Antimicrobials: Anti-infectives (From admission, onward)    None        I have personally reviewed the following labs and images: CBC: Recent Labs  Lab 07/23/23 1930 07/24/23 0030 07/25/23 0546  WBC 8.4 9.1 6.4  HGB 14.0 13.3 12.7  HCT 42.5 40.3 38.3  MCV 102.2* 101.5* 99.5  PLT 291 266 244   BMP &GFR Recent Labs  Lab 07/23/23 1930 07/24/23 0030 07/25/23 0546  NA 136  --  140  K 3.7  --  3.8  CL 103  --  103  CO2 22  --  24  GLUCOSE 130*  --  105*  BUN 10  --  14  CREATININE 0.91 0.96 0.79  CALCIUM 9.0  --  9.0  MG  --   --  1.8  PHOS  --   --  4.3    Estimated Creatinine Clearance: 59.4 mL/min (by C-G formula based on SCr of 0.79 mg/dL). Liver & Pancreas: Recent Labs  Lab 07/23/23 1930 07/25/23 0546  AST 20  --   ALT 19  --   ALKPHOS 57  --   BILITOT 0.4  --   PROT 6.6  --   ALBUMIN 3.6 3.1*   No results for input(s): "LIPASE", "AMYLASE" in the last 168 hours. No results for input(s): "AMMONIA" in the last 168 hours. Diabetic: Recent Labs    07/24/23 0030  HGBA1C 5.3   No results for input(s): "GLUCAP" in the last 168 hours. Cardiac Enzymes: No results for input(s): "CKTOTAL", "CKMB", "CKMBINDEX", "TROPONINI" in the last 168 hours. No results for input(s): "PROBNP" in the last 8760 hours. Coagulation Profile: No results for input(s): "INR", "PROTIME" in the last 168 hours. Thyroid Function Tests: No results for input(s): "TSH", "T4TOTAL", "FREET4", "T3FREE", "THYROIDAB" in the last 72 hours. Lipid Profile: Recent Labs    07/24/23 0030  CHOL 227*  HDL 46  LDLCALC 126*  TRIG 274*  CHOLHDL 4.9   Anemia Panel: No results for input(s): "VITAMINB12", "FOLATE", "FERRITIN", "TIBC", "IRON", "RETICCTPCT" in the last 72 hours. Urine analysis:    Component Value Date/Time   COLORURINE YELLOW 10/25/2013 1128   APPEARANCEUR CLEAR 10/25/2013 1128   LABSPEC 1.002 (L) 10/25/2013 1128   PHURINE 5.5 10/25/2013 1128   GLUCOSEU NEGATIVE 10/25/2013 1128   HGBUR NEGATIVE 10/25/2013 1128   BILIRUBINUR NEGATIVE 10/25/2013 1128   KETONESUR NEGATIVE 10/25/2013 1128   PROTEINUR NEGATIVE 10/25/2013 1128   UROBILINOGEN 0.2 10/25/2013 1128   NITRITE NEGATIVE 10/25/2013 1128   LEUKOCYTESUR NEGATIVE 10/25/2013 1128   Sepsis Labs: Invalid input(s): "PROCALCITONIN", "LACTICIDVEN"  Microbiology: No results found for this or any previous visit (from the past 240 hours).  Radiology Studies: No results found.     Shene Maxfield T. Canda Podgorski [facility]  If 7PM-7AM, please contact night-coverage [email or web address] 07/25/2023, 2:25 PM

## 2023-07-26 DIAGNOSIS — I639 Cerebral infarction, unspecified: Secondary | ICD-10-CM | POA: Diagnosis not present

## 2023-07-26 DIAGNOSIS — Z87891 Personal history of nicotine dependence: Secondary | ICD-10-CM | POA: Diagnosis not present

## 2023-07-26 DIAGNOSIS — I4719 Other supraventricular tachycardia: Secondary | ICD-10-CM | POA: Diagnosis not present

## 2023-07-26 DIAGNOSIS — I671 Cerebral aneurysm, nonruptured: Secondary | ICD-10-CM | POA: Diagnosis not present

## 2023-07-26 NOTE — Progress Notes (Signed)
 PROGRESS NOTE  Kristin Schmidt NGE:[id number] DOB: 27-Nov-1955   PCP: Eather Colas, FNP  Patient is from: Home.  Lives with daughter.  Independently ambulates at baseline.  DOA: 07/23/2023 LOS: 2  Chief complaints Chief Complaint  Patient presents with   Transient Ischemic Attack     Brief Narrative / Interim history: 68 year old F with PMH of HTN, HLD, tachycardia, EtOH use, anxiety, insomnia and tobacco use disorder presenting with right-sided weakness and left facial droop noted by patient's daughter, and admitted for CVA workup.  In ED, stable vitals.  Neurology consulted.  Noncontrast head CT showed small area of decreased density and loss of gray-white differentiation in the posterior right frontal lobe may represent an area of subacute ischemia.  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  No associated hemorrhage or mass effect.  Patient was started on Plavix and aspirin  The next day, vascular surgery consulted.  Plan for CEA on 3/24.   Subjective: Seen and examined earlier this morning.  No major events overnight of this morning.  No complaints.   Objective: Vitals:   07/25/23 2352 07/26/23 0346 07/26/23 0754 07/26/23 1152  BP: 134/70 (!) 147/55 130/72 (!) 151/76  Pulse: 87 93 85 89  Resp:  18 18 17   Temp: 98.2 F (36.8 C) 97.9 F (36.6 C) 97.7 F (36.5 C) 98.2 F (36.8 C)  TempSrc: Axillary Oral Oral Oral  SpO2: 96% 97% 97% 98%  Weight:      Height:        Examination:  GENERAL: No apparent distress.  Nontoxic. HEENT: MMM.  Vision  and hearing grossly intact.  NECK: Supple.  No apparent JVD.  RESP:  No IWOB.  Fair aeration bilaterally. CVS:  RRR. Heart sounds normal.  ABD/GI/GU: BS+. Abd soft, NTND.  MSK/EXT:  Moves extremities. No apparent deformity. No edema.  SKIN: no apparent skin lesion or wound NEURO: Awake, alert and oriented appropriately.  Speech clear.  PERRL.  No facial asymmetry.  Motor 4/5 in RLE and 5/5 elsewhere.  Light sensation intact.  No pronator drift.  Finger-to-nose intact. PSYCH: Calm. Normal affect.   Consultants:  Neurology Vascular surgery  Procedures: None  Microbiology summarized: None  Assessment and plan: Acute ischemic CVA: Presented with right-sided weakness, left facial droop.  MRI revealed 2.3cm acute left ACA distribution infarct involving the parasagittal left frontal lobe/cingulate gyrus.  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  TTE without significant finding.  LDL 126.  A1c 5.3%. -Appreciate input by neurology-continue Plavix and aspirin. -Continue Plavix, aspirin and Lipitor. -Encouraged smoking cessation. -Plan for CEA by vascular surgery -PT/OT/SLP eval.  Bilateral carotid artery stenosis/left subclavian artery stenosis/left MCA aneurysm -Antiplatelets as above -Vascular surgery on board.  Plan for CEA on 3/24.  Essential hypertension: BP slightly elevated -Continue holding home Cardizem for some permissive hypertension until CVA.   Alcohol use disorder: EtOH level 41.  No withdrawal symptoms. -Encouraged moderation. -Continue CIWA with as needed Ativan -Continue multivitamin, thiamine  and folic acid   Anxiety/insomnia -Continue home regimen   Hyperlipidemia: LDL 126.  Reports statin intolerance.  Stop taking pravastatin -Needs referral to cardiology for PCSK9 inhibitors  Tobacco use disorder:  Reports smoking half a pack a day -Encourage cessation -Continue nicotine patch   Right foot pain after fall: X-ray of right foot negative for fracture or dislocation but hallux valgus with degenerative changes of the first metatarsophalangeal joint.  Exam reassuring. -Tylenol as needed   Body mass index is 26.89 kg/m.           DVT prophylaxis:  enoxaparin (LOVENOX) injection 40 mg Start: 07/23/23 2345  Code Status: Full code Family Communication: None at bedside today. Level of care: Telemetry Medical Status is: Inpatient Remains inpatient appropriate because: Acute stroke and carotid artery stenosis   Final disposition: Likely home once medically stable  35 minutes with more than 50% spent in reviewing records, counseling patient/family and coordinating care.  Sch Meds:  Scheduled Meds:  aspirin EC  81 mg Oral Daily   atorvastatin  80 mg Oral Daily   clopidogrel  75 mg Oral Daily   enoxaparin (LOVENOX) injection  40 mg Subcutaneous Q24H   folic acid  1 mg Oral Daily   multivitamin with minerals  1 tablet Oral Daily   nicotine  14 mg Transdermal Daily   thiamine  100 mg Oral Daily   Or   thiamine  100 mg Intravenous Daily   Continuous Infusions:   PRN Meds:.labetalol, LORazepam **OR** LORazepam, traZODone  Antimicrobials: Anti-infectives (From admission, onward)    None        I have personally reviewed the following labs and images: CBC: Recent Labs  Lab 07/23/23 1930 07/24/23 0030 07/25/23 0546  WBC 8.4 9.1 6.4  HGB 14.0 13.3 12.7  HCT 42.5 40.3 38.3  MCV 102.2* 101.5* 99.5  PLT 291 266 244   BMP &GFR Recent Labs  Lab 07/23/23 1930 07/24/23 0030 07/25/23 0546  NA 136  --  140  K 3.7  --  3.8  CL 103  --  103  CO2 22  --  24  GLUCOSE 130*  --  105*  BUN 10  --  14  CREATININE 0.91 0.96 0.79  CALCIUM 9.0  --  9.0  MG  --   --  1.8  PHOS  --   --  4.3   Estimated Creatinine Clearance: 59.4 mL/min (by C-G formula based on SCr of  0.79 mg/dL). Liver & Pancreas: Recent Labs  Lab 07/23/23 1930 07/25/23 0546  AST 20  --   ALT 19  --   ALKPHOS 57  --   BILITOT 0.4  --   PROT 6.6  --   ALBUMIN 3.6 3.1*   No results for input(s): "LIPASE", "AMYLASE" in the last 168 hours. No results for input(s): "AMMONIA" in the last 168 hours. Diabetic: Recent Labs    07/24/23 0030  HGBA1C 5.3   No results for input(s): "GLUCAP" in the last 168 hours. Cardiac Enzymes: No results for input(s): "CKTOTAL", "CKMB", "CKMBINDEX", "TROPONINI" in the last 168 hours. No results for input(s): "PROBNP" in the last 8760 hours. Coagulation Profile: No results for input(s): "INR", "PROTIME" in the last 168 hours. Thyroid Function Tests: No results for input(s): "TSH", "T4TOTAL", "FREET4", "T3FREE", "THYROIDAB" in the last 72 hours. Lipid Profile: Recent Labs    07/24/23 0030  CHOL 227*  HDL 46  LDLCALC 126*  TRIG 274*  CHOLHDL 4.9   Anemia Panel: No  results for input(s): "VITAMINB12", "FOLATE", "FERRITIN", "TIBC", "IRON", "RETICCTPCT" in the last 72 hours. Urine analysis:    Component Value Date/Time   COLORURINE YELLOW 10/25/2013 1128   APPEARANCEUR CLEAR 10/25/2013 1128   LABSPEC 1.002 (L) 10/25/2013 1128   PHURINE 5.5 10/25/2013 1128   GLUCOSEU NEGATIVE 10/25/2013 1128   HGBUR NEGATIVE 10/25/2013 1128   BILIRUBINUR NEGATIVE 10/25/2013 1128   KETONESUR NEGATIVE 10/25/2013 1128   PROTEINUR NEGATIVE 10/25/2013 1128   UROBILINOGEN 0.2 10/25/2013 1128   NITRITE NEGATIVE 10/25/2013 1128   LEUKOCYTESUR NEGATIVE 10/25/2013 1128   Sepsis Labs: Invalid input(s): "PROCALCITONIN", "LACTICIDVEN"  Microbiology: No results found for this or any previous visit (from the past 240 hours).  Radiology Studies: No results found.     Mak Bonny T. Dianca Owensby [facility]  If 7PM-7AM, please contact night-coverage [email or web address] 07/26/2023, 3:59 PM

## 2023-07-26 NOTE — Plan of Care (Signed)

## 2023-07-26 NOTE — Plan of Care (Signed)
   Problem: Education: Goal: Knowledge of disease or condition will improve Outcome: Progressing Goal: Knowledge of secondary prevention will improve (MUST DOCUMENT ALL) Outcome: Progressing Goal: Knowledge of patient specific risk factors will improve (DELETE if not current risk factor) Outcome: Progressing

## 2023-07-27 DIAGNOSIS — I671 Cerebral aneurysm, nonruptured: Secondary | ICD-10-CM | POA: Diagnosis not present

## 2023-07-27 DIAGNOSIS — I639 Cerebral infarction, unspecified: Secondary | ICD-10-CM | POA: Diagnosis not present

## 2023-07-27 DIAGNOSIS — Z87891 Personal history of nicotine dependence: Secondary | ICD-10-CM | POA: Diagnosis not present

## 2023-07-27 DIAGNOSIS — I4719 Other supraventricular tachycardia: Secondary | ICD-10-CM | POA: Diagnosis not present

## 2023-07-27 LAB — MRSA NEXT GEN BY PCR, NASAL: MRSA by PCR Next Gen: NOT DETECTED

## 2023-07-27 MED ORDER — LORAZEPAM 0.5 MG PO TABS
0.5000 mg | ORAL_TABLET | Freq: Three times a day (TID) | ORAL | Status: DC | PRN
  Administered 2023-07-27 – 2023-07-29 (×3): 0.5 mg via ORAL
  Filled 2023-07-27 (×3): qty 1

## 2023-07-27 NOTE — Plan of Care (Signed)
  Problem: Education: Goal: Knowledge of disease or condition will improve 07/27/2023 0635 by Dahlia Bailiff, RN Outcome: Progressing 07/26/2023 2142 by Dahlia Bailiff, RN Outcome: Progressing Goal: Knowledge of secondary prevention will improve (MUST DOCUMENT ALL) 07/27/2023 0635 by Dahlia Bailiff, RN Outcome: Progressing 07/26/2023 2142 by Dahlia Bailiff, RN Outcome: Progressing Goal: Knowledge of patient specific risk factors will improve (DELETE if not current risk factor) 07/27/2023 0635 by Dahlia Bailiff, RN Outcome: Progressing 07/26/2023 2142 by Dahlia Bailiff, RN Outcome: Progressing

## 2023-07-27 NOTE — Plan of Care (Signed)
 Patient asked questions regarding her Lipitor medication. We talked about the importance of walking at least 3 times a day and the importance of maintaining a low sodium, heart healthy diet and low cholesterol diet, as well as striving towards a more Mediterranean diet. Patient acknowledged and looked items up. Patient observed ambulating with family.   Problem: Nutrition: Goal: Risk of aspiration will decrease Outcome: Progressing Goal: Dietary intake will improve Outcome: Progressing   Problem: Education: Goal: Knowledge of General Education information will improve Description: Including pain rating scale, medication(s)/side effects and non-pharmacologic comfort measures Outcome: Progressing   Problem: Activity: Goal: Risk for activity intolerance will decrease Outcome: Progressing   Problem: Coping: Goal: Level of anxiety will decrease Outcome: Progressing

## 2023-07-27 NOTE — Progress Notes (Addendum)
  Progress Note    07/27/2023 9:00 AM * No surgery found *  Subjective: No complaints.  Denies any new or worsening neurological symptoms   Vitals:   07/27/23 0431 07/27/23 0731  BP: (!) 105/53 131/63  Pulse: 88 97  Resp: 18   Temp: 98 F (36.7 C) 97.7 F (36.5 C)  SpO2: 96% 95%    Physical Exam: General: Resting comfortably, alert and oriented x 4 Cardiac: Regular Lungs: Nonlabored Neuro: Neurologically intact without facial droop, slurred speech, or sensory or motor deficits  CBC    Component Value Date/Time   WBC 6.4 07/25/2023 0546   RBC 3.85 (L) 07/25/2023 0546   HGB 12.7 07/25/2023 0546   HGB 13.6 03/19/2019 1335   HCT 38.3 07/25/2023 0546   HCT 39.1 03/19/2019 1335   PLT 244 07/25/2023 0546   PLT 323 03/19/2019 1335   MCV 99.5 07/25/2023 0546   MCV 94 03/19/2019 1335   MCH 33.0 07/25/2023 0546   MCHC 33.2 07/25/2023 0546   RDW 12.6 07/25/2023 0546   RDW 13.3 03/19/2019 1335   LYMPHSABS 2.2 10/25/2013 1121   MONOABS 0.7 10/25/2013 1121   EOSABS 0.0 10/25/2013 1121   BASOSABS 0.1 10/25/2013 1121    BMET    Component Value Date/Time   NA 140 07/25/2023 0546   NA 131 (L) 11/28/2021 0942   K 3.8 07/25/2023 0546   CL 103 07/25/2023 0546   CO2 24 07/25/2023 0546   GLUCOSE 105 (H) 07/25/2023 0546   BUN 14 07/25/2023 0546   BUN 22 11/28/2021 0942   CREATININE 0.79 07/25/2023 0546   CALCIUM 9.0 07/25/2023 0546   GFRNONAA >60 07/25/2023 0546   GFRAA 107 03/19/2019 1335    INR No results found for: "INR"  No intake or output data in the 24 hours ending 07/27/23 0900    Assessment/Plan:  68 y.o. female with symptomatic left carotid artery stenosis   -No new or worsening neurological symptoms.  Sensory and motor intact in bilateral upper and lower extremities -Plan is for left carotid endarterectomy tomorrow with Dr. Chestine Spore.  Patient is agreeable and consent has been signed -All questions have been answered -Will make n.p.o. at  midnight   Loel Dubonnet, PA-C [facility] [phone] 07/27/2023 9:00 AM   I have independently interviewed and examined patient and agree with PA assessment and plan above.  Plan as above.  Again reiterated risk benefits alternatives and she demonstrates good understanding.  Anis Degidio C. Randie Heinz, MD [facility] [place] Office: [phone] Pager: [phone]

## 2023-07-27 NOTE — Progress Notes (Addendum)
 PROGRESS NOTE  Kristin Schmidt EAV:[id number] DOB: [birth year]   PCP: Eather Colas, FNP  Patient is from: Home.  Lives with daughter.  Independently ambulates at baseline.  DOA: 07/23/2023 LOS: 3  Chief complaints Chief Complaint  Patient presents with   Transient Ischemic Attack     Brief Narrative / Interim history: 68 year old F with PMH of HTN, HLD, tachycardia, EtOH use, anxiety, insomnia and tobacco use disorder presenting with right-sided weakness and left facial droop noted by patient's daughter, and admitted for CVA workup.  In ED, stable vitals.  Neurology consulted.  Noncontrast head CT showed small area of decreased density and loss of gray-white differentiation in the posterior right frontal lobe may represent an area of subacute ischemia.  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  No associated hemorrhage or mass effect.  Patient was started on Plavix and aspirin  The next day, vascular surgery consulted.  Plan for CEA on 3/24.   Subjective: Seen and examined earlier this morning.  No major events overnight of this morning.  Feels anxious about the upcoming procedure.  Previously used Xanax without problem.  Reports improvement in leg weakness.  Patient's daughter and other family member at bedside.  Objective: Vitals:   07/26/23 2346 07/27/23 0431 07/27/23 0731 07/27/23 1135  BP: (!) 132/50 (!) 105/53 131/63 (!) 155/61  Pulse: 85 88 97 81  Resp: 18 18  16   Temp: 98 F (36.7 C) 98 F (36.7 C) 97.7 F (36.5 C) 98.2 F (36.8 C)   TempSrc: Oral Oral Oral Oral  SpO2: 97% 96% 95% 98%  Weight:      Height:        Examination:  GENERAL: No apparent distress.  Nontoxic. HEENT: MMM.  Vision and hearing grossly intact.  NECK: Supple.  No apparent JVD.  RESP:  No IWOB.  Fair aeration bilaterally. CVS:  RRR. Heart sounds normal.  ABD/GI/GU: BS+. Abd soft, NTND.  MSK/EXT:  Moves extremities. No apparent deformity. No edema.  SKIN: no apparent skin lesion or wound NEURO: Awake, alert and oriented appropriately.  Speech clear.  PERRL.  No facial asymmetry.  Motor 4+/5 in RLE and 5/5 elsewhere.  Light sensation intact.  No pronator drift.  Finger-to-nose intact. PSYCH: Calm. Normal affect  Consultants:  Neurology Vascular surgery  Procedures: None  Microbiology summarized: None  Assessment and plan: Acute ischemic CVA: Presented with right-sided weakness, left facial droop.  MRI revealed 2.3cm acute left ACA distribution infarct involving the parasagittal left frontal lobe/cingulate gyrus.  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  TTE without significant finding.  LDL 126.  A1c 5.3%. -Appreciate input by neurology-continue Plavix and aspirin. -Continue Plavix, aspirin and Lipitor. -Encouraged smoking cessation. -Plan for CEA by vascular surgery -PT/OT/SLP eval.  Bilateral carotid artery stenosis/left subclavian artery stenosis/left MCA aneurysm -Antiplatelets as above -Vascular surgery on board.  Plan for CEA on 3/24.  Essential hypertension: BP slightly elevated -Continue holding home Cardizem for some permissive hypertension until  CVA.   Alcohol use disorder: EtOH level 41.  No withdrawal symptoms. -Encouraged moderation. -Continue CIWA with as needed Ativan -Continue multivitamin, thiamine and folic acid   Anxiety/insomnia -Ativan 0.5 mg every  8 hours as needed.  Discussed risk of delirium. -Continue home trazodone.  Hyperlipidemia: LDL 126.  Reports statin intolerance but tolerating Lipitor here. -Continue Lipitor  Tobacco use disorder: Reports smoking half a pack a day -Encouraged cessation -Continue nicotine patch   Right foot pain after fall: X-ray of right foot negative for fracture or dislocation but hallux valgus with degenerative changes of the first metatarsophalangeal joint.  Exam reassuring. -Tylenol as needed   Body mass index is 26.89 kg/m.           DVT prophylaxis:  enoxaparin (LOVENOX) injection 40 mg Start: 07/23/23 2345  Code Status: Full code Family Communication: None at bedside today. Level of care: Telemetry Medical Status is: Inpatient Remains inpatient appropriate because: Acute stroke and carotid artery stenosis   Final disposition: Likely home once medically stable  35 minutes with more than 50% spent in reviewing records, counseling patient/family and coordinating care.  Sch Meds:  Scheduled Meds:  aspirin EC  81 mg Oral Daily   atorvastatin  80 mg Oral Daily   clopidogrel  75 mg Oral Daily   enoxaparin (LOVENOX) injection  40 mg Subcutaneous Q24H   folic acid  1 mg Oral Daily   multivitamin with minerals  1 tablet Oral Daily   nicotine  14 mg Transdermal Daily   thiamine  100 mg Oral Daily   Or   thiamine  100 mg Intravenous Daily   Continuous Infusions:   PRN Meds:.labetalol, LORazepam, traZODone  Antimicrobials: Anti-infectives (From admission, onward)    None        I have personally reviewed the following labs and images: CBC: Recent Labs  Lab 07/23/23 1930 07/24/23 0030 07/25/23 0546  WBC 8.4 9.1 6.4  HGB 14.0 13.3 12.7  HCT 42.5 40.3 38.3  MCV 102.2* 101.5* 99.5  PLT 291 266 244   BMP &GFR Recent Labs  Lab 07/23/23 1930 07/24/23 0030 07/25/23 0546  NA 136  --  140  K 3.7  --  3.8  CL 103  --  103  CO2 22  --  24  GLUCOSE 130*  --  105*   BUN 10  --  14  CREATININE 0.91 0.96 0.79  CALCIUM 9.0  --  9.0  MG  --   --  1.8  PHOS  --   --  4.3   Estimated Creatinine Clearance: 59.4 mL/min (by C-G formula based on SCr of 0.79 mg/dL). Liver & Pancreas: Recent Labs  Lab 07/23/23 1930 07/25/23 0546  AST 20  --   ALT 19  --   ALKPHOS 57  --   BILITOT 0.4  --   PROT 6.6  --   ALBUMIN 3.6 3.1*   No results for input(s): "LIPASE", "AMYLASE" in the last 168 hours. No results for input(s): "AMMONIA" in the last 168 hours. Diabetic: No results for input(s): "HGBA1C" in the last 72 hours.  No results for input(s): "GLUCAP" in the last 168 hours. Cardiac Enzymes: No results for input(s): "CKTOTAL", "CKMB", "CKMBINDEX", "TROPONINI" in the last 168 hours. No results for input(s): "PROBNP" in the last 8760 hours. Coagulation Profile: No results for input(s): "INR", "PROTIME" in the last 168 hours. Thyroid Function Tests: No results for input(s): "TSH", "T4TOTAL", "FREET4", "T3FREE", "THYROIDAB" in the last 72 hours. Lipid  Profile: No results for input(s): "CHOL", "HDL", "LDLCALC", "TRIG", "CHOLHDL", "LDLDIRECT" in the last 72 hours.  Anemia Panel: No results for input(s): "VITAMINB12", "FOLATE", "FERRITIN", "TIBC", "IRON", "RETICCTPCT" in the last 72 hours. Urine analysis:    Component Value Date/Time   COLORURINE YELLOW 10/25/2013 1128   APPEARANCEUR CLEAR 10/25/2013 1128   LABSPEC 1.002 (L) 10/25/2013 1128   PHURINE 5.5 10/25/2013 1128   GLUCOSEU NEGATIVE 10/25/2013 1128   HGBUR NEGATIVE 10/25/2013 1128   BILIRUBINUR NEGATIVE 10/25/2013 1128   KETONESUR NEGATIVE 10/25/2013 1128   PROTEINUR NEGATIVE 10/25/2013 1128   UROBILINOGEN 0.2 10/25/2013 1128   NITRITE NEGATIVE 10/25/2013 1128   LEUKOCYTESUR NEGATIVE 10/25/2013 1128   Sepsis Labs: Invalid input(s): "PROCALCITONIN", "LACTICIDVEN"  Microbiology: No results found for this or any previous visit (from the past 240 hours).  Radiology Studies: No results  found.     Quetzalli Clos T. Sabella Traore [facility]  If 7PM-7AM, please contact night-coverage [email or web address] 07/27/2023, 3:11 PM

## 2023-07-27 NOTE — Plan of Care (Signed)
   Problem: Education: Goal: Knowledge of disease or condition will improve Outcome: Progressing Goal: Knowledge of secondary prevention will improve (MUST DOCUMENT ALL) Outcome: Progressing Goal: Knowledge of patient specific risk factors will improve (DELETE if not current risk factor) Outcome: Progressing

## 2023-07-27 NOTE — Progress Notes (Signed)
 Patient will be NPO midnight for a procedure tomorrow 07/28/23. CHG done x1, MRSA PCR collected and sent, consent signed and place in patient chart, no concern at this time, family (daughter was at the bedside, will continue to monitor patient.

## 2023-07-28 ENCOUNTER — Encounter (HOSPITAL_COMMUNITY): Payer: Self-pay | Admitting: Student

## 2023-07-28 ENCOUNTER — Inpatient Hospital Stay (HOSPITAL_COMMUNITY): Admitting: Certified Registered"

## 2023-07-28 ENCOUNTER — Encounter (HOSPITAL_COMMUNITY)
Admission: EM | Disposition: A | Discharge status: Home / Self Care | Payer: Self-pay | Source: Home / Self Care | Attending: Internal Medicine

## 2023-07-28 ENCOUNTER — Other Ambulatory Visit: Payer: Self-pay

## 2023-07-28 DIAGNOSIS — I4719 Other supraventricular tachycardia: Secondary | ICD-10-CM | POA: Diagnosis not present

## 2023-07-28 DIAGNOSIS — I639 Cerebral infarction, unspecified: Secondary | ICD-10-CM

## 2023-07-28 DIAGNOSIS — I671 Cerebral aneurysm, nonruptured: Secondary | ICD-10-CM | POA: Diagnosis not present

## 2023-07-28 DIAGNOSIS — I6522 Occlusion and stenosis of left carotid artery: Secondary | ICD-10-CM

## 2023-07-28 DIAGNOSIS — Z87891 Personal history of nicotine dependence: Secondary | ICD-10-CM | POA: Diagnosis not present

## 2023-07-28 HISTORY — PX: PATCH ANGIOPLASTY: SHX6230

## 2023-07-28 HISTORY — PX: ENDARTERECTOMY: SHX5162

## 2023-07-28 LAB — CBC
HCT: 28.6 % — ABNORMAL LOW (ref 36.0–46.0)
Hemoglobin: 9.5 g/dL — ABNORMAL LOW (ref 12.0–15.0)
MCH: 33.8 pg (ref 26.0–34.0)
MCHC: 33.2 g/dL (ref 30.0–36.0)
MCV: 101.8 fL — ABNORMAL HIGH (ref 80.0–100.0)
Platelets: 192 10*3/uL (ref 150–400)
RBC: 2.81 MIL/uL — ABNORMAL LOW (ref 3.87–5.11)
RDW: 12.3 % (ref 11.5–15.5)
WBC: 10.9 10*3/uL — ABNORMAL HIGH (ref 4.0–10.5)
nRBC: 0 % (ref 0.0–0.2)

## 2023-07-28 LAB — TYPE AND SCREEN
ABO/RH(D): A POS
Antibody Screen: NEGATIVE

## 2023-07-28 LAB — CREATININE, SERUM
Creatinine, Ser: 0.8 mg/dL (ref 0.44–1.00)
GFR, Estimated: >60 mL/min (ref >60)

## 2023-07-28 SURGERY — ENDARTERECTOMY, CAROTID
Anesthesia: General | Laterality: Left

## 2023-07-28 MED ORDER — DEXAMETHASONE SODIUM PHOSPHATE 10 MG/ML IJ SOLN
INTRAMUSCULAR | Status: AC
  Filled 2023-07-28: qty 1

## 2023-07-28 MED ORDER — POTASSIUM CHLORIDE CRYS ER 20 MEQ PO TBCR: 20.0000 meq | EXTENDED_RELEASE_TABLET | Freq: Every day | ORAL | Status: DC | PRN

## 2023-07-28 MED ORDER — PROPOFOL 10 MG/ML IV BOLUS
INTRAVENOUS | Status: DC | PRN
Start: 2023-07-28 — End: 2023-07-28
  Administered 2023-07-28: 110 mg via INTRAVENOUS
  Administered 2023-07-28: 60 mg via INTRAVENOUS

## 2023-07-28 MED ORDER — DOCUSATE SODIUM 100 MG PO CAPS
100.0000 mg | ORAL_CAPSULE | Freq: Every day | ORAL | Status: DC
  Filled 2023-07-28: qty 1

## 2023-07-28 MED ORDER — OXYCODONE-ACETAMINOPHEN 5-325 MG PO TABS
ORAL_TABLET | ORAL | Status: AC
  Filled 2023-07-28: qty 1

## 2023-07-28 MED ORDER — HEPARIN 6000 UNIT IRRIGATION SOLUTION
Status: AC
  Filled 2023-07-28: qty 500

## 2023-07-28 MED ORDER — AMISULPRIDE (ANTIEMETIC) 5 MG/2ML IV SOLN
10.0000 mg | Freq: Once | INTRAVENOUS | Status: AC | PRN
  Administered 2023-07-28: 10 mg via INTRAVENOUS

## 2023-07-28 MED ORDER — ONDANSETRON HCL 4 MG/2ML IJ SOLN
INTRAMUSCULAR | Status: AC
  Filled 2023-07-28: qty 2

## 2023-07-28 MED ORDER — ORAL CARE MOUTH RINSE: 15.0000 mL | Freq: Once | OROMUCOSAL | Status: AC

## 2023-07-28 MED ORDER — FENTANYL CITRATE (PF) 250 MCG/5ML IJ SOLN
INTRAMUSCULAR | Status: AC
  Filled 2023-07-28: qty 5

## 2023-07-28 MED ORDER — PROPOFOL 1000 MG/100ML IV EMUL
INTRAVENOUS | Status: AC
  Filled 2023-07-28: qty 100

## 2023-07-28 MED ORDER — VASOPRESSIN 20 UNIT/ML IV SOLN
INTRAVENOUS | Status: AC
  Filled 2023-07-28: qty 1

## 2023-07-28 MED ORDER — PHENYLEPHRINE HCL-NACL 20-0.9 MG/250ML-% IV SOLN
INTRAVENOUS | Status: DC | PRN
  Administered 2023-07-28: 50 ug/min via INTRAVENOUS

## 2023-07-28 MED ORDER — ROSUVASTATIN CALCIUM 20 MG PO TABS
40.0000 mg | ORAL_TABLET | Freq: Every day | ORAL | Status: DC
  Administered 2023-07-28: 40 mg via ORAL
  Filled 2023-07-28: qty 2

## 2023-07-28 MED ORDER — HEPARIN SODIUM (PORCINE) 1000 UNIT/ML IJ SOLN
INTRAMUSCULAR | Status: DC | PRN
  Administered 2023-07-28: 7000 [IU] via INTRAVENOUS

## 2023-07-28 MED ORDER — ACETAMINOPHEN 650 MG RE SUPP: 325.0000 mg | RECTAL | Status: DC | PRN

## 2023-07-28 MED ORDER — CLEVIDIPINE BUTYRATE 0.5 MG/ML IV EMUL
INTRAVENOUS | Status: AC
  Filled 2023-07-28: qty 50

## 2023-07-28 MED ORDER — ACETAMINOPHEN 325 MG PO TABS: 325.0000 mg | ORAL_TABLET | ORAL | Status: DC | PRN

## 2023-07-28 MED ORDER — LIDOCAINE 2% (20 MG/ML) 5 ML SYRINGE
INTRAMUSCULAR | Status: AC
  Filled 2023-07-28: qty 5

## 2023-07-28 MED ORDER — METOPROLOL TARTRATE 5 MG/5ML IV SOLN: 2.0000 mg | INTRAVENOUS | Status: DC | PRN

## 2023-07-28 MED ORDER — HYDROMORPHONE HCL 1 MG/ML IJ SOLN
0.5000 mg | INTRAMUSCULAR | Status: DC | PRN
  Administered 2023-07-28: 0.5 mg via INTRAVENOUS
  Filled 2023-07-28: qty 0.5

## 2023-07-28 MED ORDER — ONDANSETRON HCL 4 MG/2ML IJ SOLN
INTRAMUSCULAR | Status: DC | PRN
  Administered 2023-07-28: 4 mg via INTRAVENOUS

## 2023-07-28 MED ORDER — PHENOL 1.4 % MT LIQD: 1.0000 | OROMUCOSAL | Status: DC | PRN

## 2023-07-28 MED ORDER — ALBUMIN HUMAN 5 % IV SOLN
INTRAVENOUS | Status: AC
  Filled 2023-07-28: qty 250

## 2023-07-28 MED ORDER — HEPARIN 6000 UNIT IRRIGATION SOLUTION
Status: DC | PRN
  Administered 2023-07-28: 1

## 2023-07-28 MED ORDER — HEPARIN SODIUM (PORCINE) 1000 UNIT/ML IJ SOLN
INTRAMUSCULAR | Status: AC
  Filled 2023-07-28: qty 10

## 2023-07-28 MED ORDER — CEFAZOLIN SODIUM-DEXTROSE 2-4 GM/100ML-% IV SOLN
2.0000 g | Freq: Three times a day (TID) | INTRAVENOUS | Status: AC
  Administered 2023-07-28 (×2): 2 g via INTRAVENOUS
  Filled 2023-07-28 (×2): qty 100

## 2023-07-28 MED ORDER — DEXAMETHASONE SODIUM PHOSPHATE 10 MG/ML IJ SOLN
INTRAMUSCULAR | Status: DC | PRN
  Administered 2023-07-28: 10 mg via INTRAVENOUS

## 2023-07-28 MED ORDER — OXYCODONE HCL 5 MG/5ML PO SOLN: 5.0000 mg | Freq: Once | ORAL | Status: DC | PRN

## 2023-07-28 MED ORDER — OXYCODONE HCL 5 MG PO TABS: 5.0000 mg | ORAL_TABLET | Freq: Once | ORAL | Status: DC | PRN

## 2023-07-28 MED ORDER — LACTATED RINGERS IV SOLN: INTRAVENOUS | Status: DC

## 2023-07-28 MED ORDER — BSS IO SOLN
15.0000 mL | Freq: Once | INTRAOCULAR | Status: AC
  Administered 2023-07-28: 15 mL
  Filled 2023-07-28: qty 15

## 2023-07-28 MED ORDER — EPHEDRINE 5 MG/ML INJ
INTRAVENOUS | Status: AC
  Filled 2023-07-28: qty 5

## 2023-07-28 MED ORDER — ACETAMINOPHEN 160 MG/5ML PO SOLN: 1000.0000 mg | Freq: Once | ORAL | Status: DC | PRN

## 2023-07-28 MED ORDER — PHENYLEPHRINE 80 MCG/ML (10ML) SYRINGE FOR IV PUSH (FOR BLOOD PRESSURE SUPPORT)
PREFILLED_SYRINGE | INTRAVENOUS | Status: AC
  Filled 2023-07-28: qty 10

## 2023-07-28 MED ORDER — EPHEDRINE SULFATE-NACL 50-0.9 MG/10ML-% IV SOSY
PREFILLED_SYRINGE | INTRAVENOUS | Status: DC | PRN
  Administered 2023-07-28 (×3): 5 mg via INTRAVENOUS

## 2023-07-28 MED ORDER — SODIUM CHLORIDE 0.9 % IV SOLN: 0.1500 ug/kg/min | INTRAVENOUS | Status: DC

## 2023-07-28 MED ORDER — HEPARIN SODIUM (PORCINE) 5000 UNIT/ML IJ SOLN
5000.0000 [IU] | Freq: Three times a day (TID) | INTRAMUSCULAR | Status: DC
  Administered 2023-07-29: 5000 [IU] via SUBCUTANEOUS
  Filled 2023-07-28: qty 1

## 2023-07-28 MED ORDER — LIDOCAINE 2% (20 MG/ML) 5 ML SYRINGE
INTRAMUSCULAR | Status: DC | PRN
  Administered 2023-07-28: 60 mg via INTRAVENOUS

## 2023-07-28 MED ORDER — FENTANYL CITRATE (PF) 250 MCG/5ML IJ SOLN
INTRAMUSCULAR | Status: DC | PRN
  Administered 2023-07-28: 50 ug via INTRAVENOUS

## 2023-07-28 MED ORDER — ROCURONIUM BROMIDE 10 MG/ML (PF) SYRINGE
PREFILLED_SYRINGE | INTRAVENOUS | Status: AC
  Filled 2023-07-28: qty 10

## 2023-07-28 MED ORDER — SODIUM CHLORIDE 0.9 % IV SOLN: INTRAVENOUS | Status: DC

## 2023-07-28 MED ORDER — KETOROLAC TROMETHAMINE 0.5 % OP SOLN
1.0000 [drp] | Freq: Four times a day (QID) | OPHTHALMIC | Status: AC
  Administered 2023-07-28 – 2023-07-29 (×4): 1 [drp] via OPHTHALMIC

## 2023-07-28 MED ORDER — ESMOLOL HCL 100 MG/10ML IV SOLN
INTRAVENOUS | Status: DC | PRN
  Administered 2023-07-28 (×2): 50 mg via INTRAVENOUS

## 2023-07-28 MED ORDER — ACETAMINOPHEN 500 MG PO TABS: 1000.0000 mg | ORAL_TABLET | Freq: Once | ORAL | Status: DC | PRN

## 2023-07-28 MED ORDER — PROTAMINE SULFATE 10 MG/ML IV SOLN
INTRAVENOUS | Status: DC | PRN
  Administered 2023-07-28: 50 mg via INTRAVENOUS

## 2023-07-28 MED ORDER — OXYCODONE-ACETAMINOPHEN 5-325 MG PO TABS
1.0000 | ORAL_TABLET | ORAL | Status: DC | PRN
  Administered 2023-07-28 – 2023-07-29 (×5): 1 via ORAL
  Filled 2023-07-28 (×4): qty 1

## 2023-07-28 MED ORDER — SODIUM CHLORIDE 0.9 % IV SOLN
0.1500 ug/kg/min | Freq: Once | INTRAVENOUS | Status: AC
  Administered 2023-07-28: .2 ug/kg/min via INTRAVENOUS
  Filled 2023-07-28 (×2): qty 2000

## 2023-07-28 MED ORDER — MAGNESIUM SULFATE 2 GM/50ML IV SOLN: 2.0000 g | Freq: Every day | INTRAVENOUS | Status: DC | PRN

## 2023-07-28 MED ORDER — ROCURONIUM BROMIDE 10 MG/ML (PF) SYRINGE
PREFILLED_SYRINGE | INTRAVENOUS | Status: DC | PRN
  Administered 2023-07-28: 10 mg via INTRAVENOUS
  Administered 2023-07-28: 70 mg via INTRAVENOUS

## 2023-07-28 MED ORDER — GUAIFENESIN-DM 100-10 MG/5ML PO SYRP: 15.0000 mL | ORAL_SOLUTION | ORAL | Status: DC | PRN

## 2023-07-28 MED ORDER — ACETAMINOPHEN 10 MG/ML IV SOLN: 1000.0000 mg | Freq: Once | INTRAVENOUS | Status: DC | PRN

## 2023-07-28 MED ORDER — VASOPRESSIN 20 UNIT/ML IV SOLN
INTRAVENOUS | Status: DC | PRN
  Administered 2023-07-28: 1 [IU] via INTRAVENOUS

## 2023-07-28 MED ORDER — CEFAZOLIN SODIUM-DEXTROSE 2-3 GM-%(50ML) IV SOLR
INTRAVENOUS | Status: DC | PRN
Start: 2023-07-28 — End: 2023-07-28
  Administered 2023-07-28: 2 g via INTRAVENOUS

## 2023-07-28 MED ORDER — PROPOFOL 10 MG/ML IV BOLUS
INTRAVENOUS | Status: AC
  Filled 2023-07-28: qty 20

## 2023-07-28 MED ORDER — PROTAMINE SULFATE 10 MG/ML IV SOLN
INTRAVENOUS | Status: AC
  Filled 2023-07-28: qty 5

## 2023-07-28 MED ORDER — CHLORHEXIDINE GLUCONATE 0.12 % MT SOLN
15.0000 mL | Freq: Once | OROMUCOSAL | Status: AC
  Administered 2023-07-28: 15 mL via OROMUCOSAL
  Filled 2023-07-28: qty 15

## 2023-07-28 MED ORDER — PANTOPRAZOLE SODIUM 40 MG PO TBEC
40.0000 mg | DELAYED_RELEASE_TABLET | Freq: Every day | ORAL | Status: DC
  Administered 2023-07-29: 40 mg via ORAL
  Filled 2023-07-28: qty 1

## 2023-07-28 MED ORDER — KETOROLAC TROMETHAMINE 0.5 % OP SOLN
OPHTHALMIC | Status: AC
  Filled 2023-07-28: qty 5

## 2023-07-28 MED ORDER — SUGAMMADEX SODIUM 200 MG/2ML IV SOLN
INTRAVENOUS | Status: DC | PRN
  Administered 2023-07-28: 150 mg via INTRAVENOUS

## 2023-07-28 MED ORDER — LIDOCAINE HCL (PF) 1 % IJ SOLN
INTRAMUSCULAR | Status: AC
  Filled 2023-07-28: qty 5

## 2023-07-28 MED ORDER — ALBUMIN HUMAN 5 % IV SOLN
12.5000 g | Freq: Once | INTRAVENOUS | Status: AC
  Administered 2023-07-28: 12.5 g via INTRAVENOUS

## 2023-07-28 MED ORDER — ALBUMIN HUMAN 5 % IV SOLN: INTRAVENOUS | Status: DC | PRN

## 2023-07-28 MED ORDER — AMISULPRIDE (ANTIEMETIC) 5 MG/2ML IV SOLN
INTRAVENOUS | Status: AC
  Filled 2023-07-28: qty 4

## 2023-07-28 MED ORDER — HEMOSTATIC AGENTS (NO CHARGE) OPTIME
TOPICAL | Status: DC | PRN
  Administered 2023-07-28 (×4): 1 via TOPICAL

## 2023-07-28 MED ORDER — PHENYLEPHRINE 80 MCG/ML (10ML) SYRINGE FOR IV PUSH (FOR BLOOD PRESSURE SUPPORT)
PREFILLED_SYRINGE | INTRAVENOUS | Status: DC | PRN
  Administered 2023-07-28: 160 ug via INTRAVENOUS
  Administered 2023-07-28: 80 ug via INTRAVENOUS
  Administered 2023-07-28 (×2): 160 ug via INTRAVENOUS
  Administered 2023-07-28: 80 ug via INTRAVENOUS

## 2023-07-28 MED ORDER — ALUM & MAG HYDROXIDE-SIMETH 200-200-20 MG/5ML PO SUSP: 15.0000 mL | ORAL | Status: DC | PRN

## 2023-07-28 MED ORDER — HYDRALAZINE HCL 20 MG/ML IJ SOLN: 5.0000 mg | INTRAMUSCULAR | Status: DC | PRN

## 2023-07-28 MED ORDER — LABETALOL HCL 5 MG/ML IV SOLN: 10.0000 mg | INTRAVENOUS | Status: DC | PRN

## 2023-07-28 MED ORDER — FENTANYL CITRATE (PF) 100 MCG/2ML IJ SOLN: 25.0000 ug | INTRAMUSCULAR | Status: DC | PRN

## 2023-07-28 MED ORDER — SODIUM CHLORIDE 0.9 % IV SOLN: 500.0000 mL | Freq: Once | INTRAVENOUS | Status: DC | PRN

## 2023-07-28 MED ORDER — ONDANSETRON HCL 4 MG/2ML IJ SOLN
4.0000 mg | Freq: Four times a day (QID) | INTRAMUSCULAR | Status: DC | PRN
  Administered 2023-07-28: 4 mg via INTRAVENOUS
  Filled 2023-07-28: qty 2

## 2023-07-28 MED ORDER — 0.9 % SODIUM CHLORIDE (POUR BTL) OPTIME
TOPICAL | Status: DC | PRN
  Administered 2023-07-28 (×2): 1000 mL

## 2023-07-28 SURGICAL SUPPLY — 40 items
BAG COUNTER SPONGE SURGICOUNT (BAG) ×1 IMPLANT
CANISTER SUCT 3000ML PPV (MISCELLANEOUS) ×1 IMPLANT
CATH ROBINSON RED A/P 18FR (CATHETERS) ×1 IMPLANT
CLIP TI MEDIUM 24 (CLIP) ×1 IMPLANT
CLIP TI WIDE RED SMALL 24 (CLIP) ×1 IMPLANT
COVER PROBE W GEL 5X96 (DRAPES) IMPLANT
COVER TRANSDUCER ULTRASND GEL (DISPOSABLE) ×1 IMPLANT
DERMABOND ADVANCED .7 DNX12 (GAUZE/BANDAGES/DRESSINGS) ×1 IMPLANT
ELECT REM PT RETURN 9FT ADLT (ELECTROSURGICAL) ×1 IMPLANT
ELECTRODE REM PT RTRN 9FT ADLT (ELECTROSURGICAL) ×1 IMPLANT
GLOVE BIO SURGEON STRL SZ7.5 (GLOVE) ×1 IMPLANT
GLOVE BIOGEL PI IND STRL 8 (GLOVE) ×1 IMPLANT
GOWN STRL REUS W/ TWL LRG LVL3 (GOWN DISPOSABLE) ×2 IMPLANT
GOWN STRL REUS W/ TWL XL LVL3 (GOWN DISPOSABLE) ×2 IMPLANT
GRAFT VASC PATCH XENOSURE 1X14 (Vascular Products) IMPLANT
HEMOSTAT HEMOBLAST BELLOWS (HEMOSTASIS) IMPLANT
HEMOSTAT SNOW SURGICEL 2X4 (HEMOSTASIS) IMPLANT
KIT BASIN OR (CUSTOM PROCEDURE TRAY) ×1 IMPLANT
KIT SHUNT ARGYLE CAROTID ART 6 (VASCULAR PRODUCTS) IMPLANT
KIT TURNOVER KIT B (KITS) ×1 IMPLANT
NDL HYPO 25GX1X1/2 BEV (NEEDLE) IMPLANT
NDL SPNL 20GX3.5 QUINCKE YW (NEEDLE) IMPLANT
NEEDLE HYPO 25GX1X1/2 BEV (NEEDLE) IMPLANT
NEEDLE SPNL 20GX3.5 QUINCKE YW (NEEDLE) IMPLANT
NS IRRIG 1000ML POUR BTL (IV SOLUTION) ×3 IMPLANT
PACK CAROTID (CUSTOM PROCEDURE TRAY) ×1 IMPLANT
PAD ARMBOARD POSITIONER FOAM (MISCELLANEOUS) ×2 IMPLANT
POSITIONER HEAD DONUT 9IN (MISCELLANEOUS) ×1 IMPLANT
SHUNT CAROTID BYPASS 10 (VASCULAR PRODUCTS) IMPLANT
SHUNT CAROTID BYPASS 12FRX15.5 (VASCULAR PRODUCTS) IMPLANT
SUT MNCRL AB 4-0 PS2 18 (SUTURE) ×1 IMPLANT
SUT PROLENE 5 0 C 1 24 (SUTURE) ×1 IMPLANT
SUT PROLENE 6 0 BV (SUTURE) ×1 IMPLANT
SUT PROLENE 7 0 BV1 MDA (SUTURE) IMPLANT
SUT SILK 3-0 18XBRD TIE 12 (SUTURE) IMPLANT
SUT VIC AB 3-0 SH 27X BRD (SUTURE) ×1 IMPLANT
SYR CONTROL 10ML LL (SYRINGE) IMPLANT
TOWEL GREEN STERILE (TOWEL DISPOSABLE) ×1 IMPLANT
VASCULAR TIE MINI RED 18IN STL (MISCELLANEOUS) IMPLANT
WATER STERILE IRR 1000ML POUR (IV SOLUTION) ×1 IMPLANT

## 2023-07-28 NOTE — Transfer of Care (Signed)
 Immediate Anesthesia Transfer of Care Note  Patient: Kristin Schmidt  Procedure(s) Performed: ENDARTERECTOMY, CAROTID LEFT (Left) ANGIOPLASTY, USING PATCH GRAFT LEFT CAROTID (Left)  Patient Location: PACU  Anesthesia Type:General  Level of Consciousness: awake, alert , and oriented  Airway & Oxygen Therapy: Patient Spontanous Breathing and Patient connected to nasal cannula oxygen  Post-op Assessment: Report given to RN and Post -op Vital signs reviewed and stable  Post vital signs: Reviewed and stable  Last Vitals:  Vitals Value Taken Time  BP 104/73 07/28/23 1115  Temp    Pulse 105 07/28/23 1118  Resp 10 07/28/23 1118  SpO2 96 % 07/28/23 1118  Vitals shown include unfiled device data.  Last Pain:  Vitals:   07/28/23 0652  TempSrc:   PainSc: 0-No pain         Complications: No notable events documented.

## 2023-07-28 NOTE — Anesthesia Procedure Notes (Signed)
 Arterial Line Insertion Start/End3/24/2025 7:00 AM, 07/28/2023 7:12 AM Performed by: Val Eagle, MD, Alwyn Ren, CRNA, CRNA  Patient location: Pre-op. Preanesthetic checklist: patient identified, IV checked, site marked, risks and benefits discussed, surgical consent, monitors and equipment checked, pre-op evaluation, timeout performed and anesthesia consent Lidocaine 1% used for infiltration Left, radial was placed Catheter size: 20 G Hand hygiene performed  and maximum sterile barriers used   Attempts: 1 Procedure performed using ultrasound guided technique. Ultrasound Notes:anatomy identified, needle tip was noted to be adjacent to the nerve/plexus identified and no ultrasound evidence of intravascular and/or intraneural injection Following insertion, dressing applied. Post procedure assessment: normal and unchanged

## 2023-07-28 NOTE — Progress Notes (Signed)
 PHARMACIST LIPID MONITORING   Kristin Schmidt is a 68 y.o. female admitted on 07/23/2023 with R sided weakness and L facial droop.  Pharmacy has been consulted to optimize lipid-lowering therapy with the indication of secondary prevention for clinical ASCVD.  Recent Labs:  Lipid Panel (last 6 months):   Lab Results  Component Value Date   CHOL 227 (H) 07/24/2023   TRIG 274 (H) 07/24/2023   HDL 46 07/24/2023   CHOLHDL 4.9 07/24/2023   VLDL 55 (H) 07/24/2023   LDLCALC 126 (H) 07/24/2023    Hepatic function panel (last 6 months):   Lab Results  Component Value Date   AST 20 07/23/2023   ALT 19 07/23/2023   ALKPHOS 57 07/23/2023   BILITOT 0.4 07/23/2023    SCr (since admission):   Serum creatinine: 0.79 mg/dL 40/98/11 9147 Estimated creatinine clearance: 59.4 mL/min  Current therapy and lipid therapy tolerance Current lipid-lowering therapy: in hospital atorvastatin 80 mg (on pravastatin 10 mg PTA) Previous lipid-lowering therapies (if applicable): atorvastatin 20 mg  Documented or reported allergies or intolerances to lipid-lowering therapies (if applicable): notes reference muscle aches with atorvastatin previously   Assessment:   Had been changed to pravastatin as outpatient due to muscle pains with atorvastatin previously. Discussed with patient and daughter at bedside, will trial using crestor instead of atorvastatin at high-intensity dosing. If muscle aches would reoccur then could consider lipid clinic referral.   Plan:    1.Statin intensity (high intensity recommended for all patients regardless of the LDL):  Add or increase statin to high intensity.  2.Add ezetimibe (if any one of the following):   Not indicated at this time.  3.Refer to lipid clinic:   No  4.Follow-up with:  Primary care provider - Kristin Colas, Kristin Schmidt  5.Follow-up labs after discharge:  Changes in lipid therapy were made. Check a lipid panel in 8-12 weeks then annually.      Thank you  for allowing pharmacy to participate in this patient's care,  Sherron Monday, PharmD, BCCCP Clinical Pharmacist  Phone: [phone] 07/28/2023 1:03 PM  Please check AMION for all [facility] phone numbers After 10:00 PM, call Main Pharmacy [phone]

## 2023-07-28 NOTE — Plan of Care (Signed)
  Problem: Health Behavior/Discharge Planning: Goal: Ability to manage health-related needs will improve Outcome: Progressing   Problem: Clinical Measurements: Goal: Will remain free from infection Outcome: Progressing Goal: Respiratory complications will improve Outcome: Progressing Goal: Cardiovascular complication will be avoided Outcome: Progressing   

## 2023-07-28 NOTE — Progress Notes (Signed)
  Progress Note    07/28/2023 4:13 PM * Day of Surgery *  Subjective:  right eye " feels like something is flipped over inside of it". Otherwise no complaints   Vitals:   07/28/23 1500 07/28/23 1600  BP: 117/60 (!) 103/55  Pulse: 88 91  Resp: 13 15  Temp:    SpO2: 93% 95%   Physical Exam: Cardiac:  regular Lungs:  non labored Incisions:  left neck incision is intact, without swelling or hematoma Extremities:  Moving all extremities without deficits  Neurologic: alert and oriented. Speech coherent. Smile symmetric. Tongue midline  CBC    Component Value Date/Time   WBC 10.9 (H) 07/28/2023 1426   RBC 2.81 (L) 07/28/2023 1426   HGB 9.5 (L) 07/28/2023 1426   HGB 13.6 03/19/2019 1335   HCT 28.6 (L) 07/28/2023 1426   HCT 39.1 03/19/2019 1335   PLT 192 07/28/2023 1426   PLT 323 03/19/2019 1335   MCV 101.8 (H) 07/28/2023 1426   MCV 94 03/19/2019 1335   MCH 33.8 07/28/2023 1426   MCHC 33.2 07/28/2023 1426   RDW 12.3 07/28/2023 1426   RDW 13.3 03/19/2019 1335   LYMPHSABS 2.2 10/25/2013 1121   MONOABS 0.7 10/25/2013 1121   EOSABS 0.0 10/25/2013 1121   BASOSABS 0.1 10/25/2013 1121    BMET    Component Value Date/Time   NA 140 07/25/2023 0546   NA 131 (L) 11/28/2021 0942   K 3.8 07/25/2023 0546   CL 103 07/25/2023 0546   CO2 24 07/25/2023 0546   GLUCOSE 105 (H) 07/25/2023 0546   BUN 14 07/25/2023 0546   BUN 22 11/28/2021 0942   CREATININE 0.80 07/28/2023 1426   CALCIUM 9.0 07/25/2023 0546   GFRNONAA >60 07/28/2023 1426   GFRAA 107 03/19/2019 1335    INR No results found for: "INR"   Intake/Output Summary (Last 24 hours) at 07/28/2023 1613 Last data filed at 07/28/2023 1122 Gross per 24 hour  Intake 1250 ml  Output 25 ml  Net 1225 ml     Assessment/Plan:  68 y.o. female is s/p Left CEA   Neurologically intact Right eye is bothering her but this has been ongoing, not new post operatively Left neck incision is clean, dry and intact without swelling or  hematoma Hemodynamically stable If she progresses well anticipate from our standpoint she will be stable for discharge tomorrow   Dory Horn [facility] [phone] 07/28/2023 4:13 PM

## 2023-07-28 NOTE — Anesthesia Procedure Notes (Signed)
 Procedure Name: Intubation Date/Time: 07/28/2023 8:01 AM  Performed by: Alwyn Ren, CRNAPre-anesthesia Checklist: Patient identified, Emergency Drugs available, Suction available and Patient being monitored Patient Re-evaluated:Patient Re-evaluated prior to induction Oxygen Delivery Method: Circle system utilized Preoxygenation: Pre-oxygenation with 100% oxygen Induction Type: IV induction Ventilation: Mask ventilation without difficulty Laryngoscope Size: Miller and 2 Grade View: Grade I Tube type: Oral Tube size: 7.0 mm Number of attempts: 1 Airway Equipment and Method: Stylet and Oral airway Placement Confirmation: ETT inserted through vocal cords under direct vision, positive ETCO2 and breath sounds checked- equal and bilateral Secured at: 22 cm Tube secured with: Tape Dental Injury: Teeth and Oropharynx as per pre-operative assessment

## 2023-07-28 NOTE — Op Note (Signed)
 OPERATIVE NOTE DATE: July 28, 2023   PROCEDURE:   1.  left carotid endarterectomy with bovine patch angioplasty 2.  left intraoperative carotid ultrasound  PRE-OPERATIVE DIAGNOSIS: left symptomatic high grade carotid stenosis >80%  POST-OPERATIVE DIAGNOSIS: same as above   SURGEON: Cephus Shelling, MD  ASSISTANT(S): Mosetta Pigeon, PA  ANESTHESIA: General  ESTIMATED BLOOD LOSS: 400 mL  FINDING(S): Extensive endarterectomy was carried from the common carotid artery below the clavicle all the way up through the carotid bifurcation into the ICA.  Extensive endarterectomy was performed with good distal endpoint in the internal carotid artery.  Bovine pericardial patch was sewn.  No evidence of intimal flap on intraoperative duplex at completion.  Good flow Doppler signals in both the internal and external carotid artery.  SPECIMEN(S):  Carotid plaque (sent to Pathology)  INDICATIONS:   Kristin Schmidt is a 68 y.o. female who presents with left symptomatic high grade carotid stenosis >80%.  I discussed with the patient the risks, benefits, and alternatives to carotid endarterectomy.  I discussed the procedural details of carotid endarterectomy with the patient.  The patient is aware that the risks of carotid endarterectomy include but are not limited to: bleeding, infection, stroke, myocardial infarction, death, cranial nerve injuries both temporary and permanent, neck hematoma, possible airway compromise, labile blood pressure post-operatively, cerebral hyperperfusion syndrome, and possible need for additional interventions in the future. The patient is aware of the risks and agrees to proceed forward with the procedure.  An assistant was needed given the complexity of the case and also for endarterectomy and sewing the patch angioplasty.  DESCRIPTION: After full informed written consent was obtained from the patient, the patient was brought back to the operating room  and placed supine upon the operating table.  Prior to induction, the patient received IV antibiotics.  After obtaining adequate anesthesia, the patient was placed into semi-Fowler position with a shoulder roll in place and the patient's neck slightly hyperextended and rotated away from the surgical site.  The patient was prepped in the standard fashion for a left carotid endarterectomy.  I made an incision anterior to the sternocleidomastoid muscle and dissected down through the subcutaneous tissue.  The platysma was opened with electrocautery.  I then used Bovie cautery and blunt dissection to dissect through the underlying platysma and to mobilize the anterior border of the sternocleidomastoid as well as the internal jugular vein laterally.  The facial vein was ligated with 3-0 silk and divided.  After identifying the carotid artery I used Metzenbaum scissors to bluntly dissect the common carotid artery and then controlled this with a umbilical tape.  At this point in time the patient was given 100 units/kg of IV heparin and we checked an ACT to ensure it was greater than 250.  I then carried my dissection cephalad and mobilized the external carotid artery and superior thyroid artery and controlled each of these with a vessel loop.  I then dissected out the internal carotid artery well past the distal plaque.  The internal carotid artery was then controlled with a umbilical tape as well. I was careful to identify the vagus nerve between the internal jugular and common carotid and this was presereved.  I was also careful to identify and preserve the hypoglossal nerve and this was preserved.    Once our ACT was confirmed, I proceeded by clamping the internal carotid artery with a angled bulldog clamp first.  The proximal common carotid artery was controlled with a baby  profunda and I had to clamp the common carotid artery where it was soft below the clavicle.  The external carotid was controlled with a vessel loop.   I subsequently opened the common carotid artery with an 11 blade scalpel in longitudinal fashion and extended the arteriotomy with Potts scissors onto the ICA past the distal plaque.  I also extended this proximally given extensive disease in the common carotid artery down to the level of the clavicle.  Given the extent of arteriotomy,  I did not have enough room to place a shunt.  I then used a Runner, broadcasting/film/video and performed a endarterectomy starting in the common carotid artery.  The external carotid artery was endarterectomized with an eversion technique and I was careful to feather the distal ICA plaque.  The specimen was passed off the field.  The endarterectomy site was then flushed with heparinized saline and I was careful to ensure there were no flaps in the endarterectomy site.  The distal end of the arteriotomy site was tacked down with multiple 7-0 Prolene.  I then brought a bovine carotid patch on the field and this was sewn in place with a running anastomosis using a 6-0 Prolene distally and a 5-0 proximal with the help of my assistant.  The bovine patch was trimmed accordingly.  The artery was flushed antegrade and retrograde prior to completion of the patch.  Once the patch was complete, I flushed up the external carotid artery first prior to releasing the internal carotid artery clamp.  An intraoperative duplex was performed  and there was no evidence of any flaps.  Once I was happy with the intraoperative ultrasound the patient was given protamine for reversal.  I used surgicel snow to get hemostasis around the patch.  The placed multiple repair sutures in the patch.  The patient was very oozy.  I ended up having to use Hemoblast for additional hemostasis.  Finally once I was happy with hemostasis the platysma was closed in running fashion with 3-0 Vicryl.  The skin was closed with a running 4-0 Monocryl.  Dermabond was applied with a dry sterile dressing.  The patient was awakened from anesthesia  with no new neurological deficit and taken to PACU in stable condition.    COMPLICATIONS: None  CONDITION: Stable  Cephus Shelling, MD [facility] Office: [phone]  Cephus Shelling   07/28/2023, 11:06 AM

## 2023-07-28 NOTE — Progress Notes (Signed)
 [facility] [place]  Subjective  - no complaints   Objective (!) 168/59 87 97.6 F (36.4 C) (Oral) 18 96% No intake or output data in the 24 hours ending 07/28/23 0728  Neuro intact  Laboratory Lab Results: No results for input(s): "WBC", "HGB", "HCT", "PLT" in the last 72 hours. BMET No results for input(s): "NA", "K", "CL", "CO2", "GLUCOSE", "BUN", "CREATININE", "CALCIUM" in the last 72 hours.  COAG No results found for: "INR", "PROTIME" No results found for: "PTT"  Assessment/Planning:  Plan left CEA for high grade symptomatic stenosis.  Risks and benefits again discussed including 1% perioperative stroke risk.  Cephus Shelling 07/28/2023 7:28 AM --

## 2023-07-28 NOTE — Progress Notes (Signed)
 PROGRESS NOTE  Kristin Schmidt QMV:[id number] DOB: July 31, 1955   PCP: Eather Colas, FNP  Patient is from: Home.  Lives with daughter.  Independently ambulates at baseline.  DOA: 07/23/2023 LOS: 4  Chief complaints Chief Complaint  Patient presents with   Transient Ischemic Attack     Brief Narrative / Interim history: 68 year old F with PMH of HTN, HLD, tachycardia, EtOH use, anxiety, insomnia and tobacco use disorder presenting with right-sided weakness and left facial droop noted by patient's daughter, and admitted for CVA workup.  In ED, stable vitals.  Neurology consulted.  Noncontrast head CT showed small area of decreased density and loss of gray-white differentiation in the posterior right frontal lobe may represent an area of subacute ischemia.  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  No associated hemorrhage or mass effect.  Patient was started on Plavix and aspirin  Patient underwent left CEA with bovine patch angioplasty by Dr. Chestine Spore on 07/28/2023.   Subjective: Seen and examined earlier this afternoon after she returned from CTA.  She reports right eye pain that started yesterday.  Daughter has been flushing it with saline solution.  Does not recall trauma.  Vision intact.  Denies itching.  Currently keeping it closed due to pain.  She was started on ketorolac eyedrop in PACU.  Objective: Vitals:   07/28/23 1215 07/28/23 1253 07/28/23 1300 07/28/23 1400  BP: 108/67 (!) 108/53 (!) 101/51 (!) 100/42  Pulse: (!) 109 (!) 106  96  Resp: 12 15  16    Temp:  (!) 97.3 F (36.3 C)    TempSrc:  Oral    SpO2: 95% 95%  95%  Weight:      Height:        Examination:  GENERAL: No apparent distress.  Nontoxic. HEENT: MMM.  Vision grossly intact.  Some conjunctival injection.  Cornea clear.  PERRL. NECK: Supple.  No apparent JVD.  RESP:  No IWOB.  Fair aeration bilaterally. CVS:  RRR. Heart sounds normal.  ABD/GI/GU: BS+. Abd soft, NTND.  MSK/EXT:  Moves extremities. No apparent deformity. No edema.  SKIN: no apparent skin lesion or wound NEURO: Awake, alert and oriented appropriately.  Speech clear.  PERRL.  No facial asymmetry.  Motor 4+/5 in RLE and 5/5 elsewhere.  Light sensation intact.  No pronator drift.  Finger-to-nose intact. PSYCH: Calm. Normal affect  Consultants:  Neurology Vascular surgery  Procedures: 3/24-left carotid endarterectomy with bovine patch angioplasty by Dr. Chestine Spore  Microbiology summarized: MRSA PCR screen nonreactive.  Assessment and plan: Acute ischemic CVA: Presented with right-sided weakness, left facial droop.  MRI revealed 2.3cm acute left ACA distribution infarct involving the parasagittal left frontal lobe/cingulate gyrus.  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  TTE without significant finding.  LDL 126.  A1c 5.3%. -S/p  left carotid endarterectomy with bovine patch angioplasty by Dr. Chestine Spore on 3/24 -Appreciate input by neurology-continue Plavix and aspirin. -Continue Plavix, aspirin and Lipitor. -Encouraged smoking cessation. -PT/OT/SLP eval.  Bilateral  carotid artery stenosis/left subclavian artery stenosis/left MCA aneurysm -S/p  left carotid endarterectomy with bovine patch angioplasty by Dr. Chestine Spore on 3/24 -Antiplatelets as above  Essential hypertension: Soft blood pressure this afternoon.  Slightly tachycardic.  EBL about 400  cc. -Continue holding home Cardizem for some permissive hypertension until CVA. -Check CBC   Alcohol use disorder: EtOH level 41.  No withdrawal symptoms. -Encouraged moderation. -Continue CIWA with as needed Ativan -Continue multivitamin, thiamine and folic acid   Anxiety/insomnia -Ativan 0.5 mg every 8 hours as needed.  Discussed risk of delirium. -Continue home trazodone.  Hyperlipidemia: LDL 126.  Reports statin intolerance but tolerating Lipitor here. -Continue Lipitor  Tobacco use disorder: Reports smoking half a pack a day -Encouraged cessation -Continue nicotine patch   Right foot pain after fall: X-ray of right foot negative for fracture or dislocation but hallux valgus with degenerative changes of the first metatarsophalangeal joint.  Exam reassuring. -Tylenol as needed  Right eye pain: Traumatic?  Exam with some conjunctival injection.  Cornea clear.  No visual impairment.  PERRL. -Continue ketorolac eyedrop   Body mass index is 26.89 kg/m.           DVT prophylaxis:  heparin injection 5,000 Units Start: 07/29/23 0600 SCD's Start: 07/28/23 1247  Code Status: Full code Family Communication: Updated patient's daughter at bedside. Level of care: Progressive Status is: Inpatient Remains inpatient appropriate because: Acute stroke and carotid artery stenosis   Final disposition: Likely home once medically stable  35 minutes with more than 50% spent in reviewing records, counseling patient/family and coordinating care.  Sch Meds:  Scheduled Meds:  aspirin EC  81 mg Oral Daily   balanced salts  15 mL Irrigation Once   clopidogrel  75 mg Oral Daily   [START ON 07/29/2023] docusate sodium  100 mg Oral Daily   folic acid  1 mg Oral Daily   [START ON 07/29/2023] heparin  5,000 Units Subcutaneous Q8H   ketorolac  1 drop Right Eye Q6H   multivitamin with minerals  1 tablet Oral Daily   nicotine  14 mg Transdermal Daily   pantoprazole  40 mg Oral Daily    rosuvastatin  40 mg Oral QHS   thiamine  100 mg Oral Daily   Or   thiamine  100 mg Intravenous Daily   Continuous Infusions:  sodium chloride     sodium chloride 100 mL/hr at 07/28/23 1300    ceFAZolin (ANCEF) IV 2 g (07/28/23 1302)   magnesium sulfate bolus IVPB      PRN Meds:.sodium chloride, acetaminophen **OR** acetaminophen, alum & mag hydroxide-simeth, guaiFENesin-dextromethorphan, hydrALAZINE, HYDROmorphone (DILAUDID) injection, labetalol, LORazepam, magnesium sulfate bolus IVPB, metoprolol tartrate, ondansetron, oxyCODONE-acetaminophen, phenol, potassium chloride, traZODone  Antimicrobials: Anti-infectives (From admission, onward)    Start     Dose/Rate Route Frequency Ordered Stop   07/28/23 1345  ceFAZolin (ANCEF) IVPB 2g/100 mL premix        2 g 200 mL/hr over 30 Minutes Intravenous Every 8 hours 07/28/23 1246 07/29/23 0544        I have personally reviewed the following labs and images: CBC: Recent Labs  Lab 07/23/23 1930 07/24/23 0030 07/25/23 0546  WBC 8.4 9.1 6.4  HGB 14.0 13.3 12.7  HCT 42.5 40.3 38.3  MCV 102.2* 101.5* 99.5  PLT 291 266 244   BMP &GFR Recent Labs  Lab 07/23/23 1930 07/24/23 0030 07/25/23 0546  NA 136  --  140  K 3.7  --  3.8  CL 103  --  103  CO2 22  --  24  GLUCOSE 130*  --  105*  BUN 10  --  14  CREATININE 0.91 0.96 0.79  CALCIUM 9.0  --  9.0  MG  --   --  1.8  PHOS  --   --  4.3   Estimated Creatinine Clearance: 59.4 mL/min (by C-G formula based on SCr of 0.79 mg/dL). Liver & Pancreas: Recent Labs  Lab 07/23/23 1930 07/25/23 0546  AST 20  --   ALT 19  --   ALKPHOS 57  --   BILITOT 0.4  --   PROT 6.6  --   ALBUMIN 3.6 3.1*   No results for input(s): "LIPASE", "AMYLASE" in the last 168 hours. No results for input(s): "AMMONIA" in the last 168 hours. Diabetic: No results for input(s): "HGBA1C" in the last 72 hours.  No results for input(s): "GLUCAP" in the last 168 hours. Cardiac Enzymes: No results for  input(s): "CKTOTAL", "CKMB", "CKMBINDEX", "TROPONINI" in the last 168 hours. No results for input(s): "PROBNP" in the last 8760 hours. Coagulation Profile: No results for input(s): "INR", "PROTIME" in the last 168 hours. Thyroid Function Tests: No results for input(s): "TSH", "T4TOTAL", "FREET4", "T3FREE", "THYROIDAB" in the last 72 hours. Lipid Profile: No results for input(s): "CHOL", "HDL", "LDLCALC", "TRIG", "CHOLHDL", "LDLDIRECT" in the last 72 hours.  Anemia Panel: No results for input(s): "VITAMINB12", "FOLATE", "FERRITIN", "TIBC", "IRON", "RETICCTPCT" in the last 72 hours. Urine analysis:    Component Value Date/Time   COLORURINE YELLOW 10/25/2013 1128   APPEARANCEUR CLEAR 10/25/2013 1128   LABSPEC 1.002 (L) 10/25/2013 1128   PHURINE 5.5 10/25/2013 1128   GLUCOSEU NEGATIVE 10/25/2013 1128   HGBUR NEGATIVE 10/25/2013 1128   BILIRUBINUR NEGATIVE 10/25/2013 1128   KETONESUR NEGATIVE 10/25/2013 1128   PROTEINUR NEGATIVE 10/25/2013 1128   UROBILINOGEN 0.2 10/25/2013 1128   NITRITE NEGATIVE 10/25/2013 1128   LEUKOCYTESUR NEGATIVE 10/25/2013 1128   Sepsis Labs: Invalid input(s): "PROCALCITONIN", "LACTICIDVEN"  Microbiology: Recent Results (from the past 240 hours)  MRSA Next Gen by PCR, Nasal     Status: None   Collection Time: 07/27/23 10:03 PM   Specimen: Nasal Mucosa; Nasal Swab  Result Value Ref Range Status   MRSA by PCR Next Gen NOT DETECTED NOT DETECTED Final    Comment: (NOTE) The GeneXpert MRSA Assay (FDA approved for NASAL specimens only), is one component of a comprehensive MRSA colonization surveillance program. It is not intended to diagnose MRSA infection nor to guide or monitor treatment for MRSA infections. Test performance is not FDA approved in patients less than 50 years old. Performed at [facility], [address], [place], [place] [address]     Radiology Studies: No results found.     Taelyr Jantz T. Roy Snuffer [facility]  If 7PM-7AM,  please contact night-coverage [email or web address] 07/28/2023, 2:34 PM

## 2023-07-28 NOTE — Anesthesia Preprocedure Evaluation (Signed)
 Anesthesia Evaluation  Patient identified by MRN, date of birth, ID band Patient awake    Reviewed: Allergy & Precautions, NPO status , Patient's Chart, lab work & pertinent test results  History of Anesthesia Complications Negative for: history of anesthetic complications  Airway Mallampati: III  TM Distance: >3 FB Neck ROM: Full    Dental  (+) Teeth Intact, Dental Advisory Given   Pulmonary neg shortness of breath, neg sleep apnea, neg COPD, neg recent URI, Patient abstained from smoking., former smoker   breath sounds clear to auscultation       Cardiovascular hypertension, Pt. on medications and Pt. on home beta blockers + CAD and + Peripheral Vascular Disease   Rhythm:Regular  1. Left ventricular ejection fraction, by estimation, is 60 to 65%. The  left ventricle has normal function. The left ventricle has no regional  wall motion abnormalities. Diastolic function could not be evaluated due  to suboptimal images.   2. Right ventricular systolic function is normal. The right ventricular  size is normal. Mildly increased right ventricular wall thickness.   3. The mitral valve is normal in structure. Trivial mitral valve  regurgitation. No evidence of mitral stenosis.   4. The aortic valve was not well visualized. Aortic valve regurgitation  is not visualized.   5. The inferior vena cava is normal in size with greater than 50%  respiratory variability, suggesting right atrial pressure of 3 mmHg.      Neuro/Psych neg Seizures CVA, Residual Symptoms    GI/Hepatic negative GI ROS, Neg liver ROS,,,  Endo/Other    Renal/GU negative Renal ROSLab Results      Component                Value               Date                      NA                       140                 07/25/2023                K                        3.8                 07/25/2023                CO2                      24                  07/25/2023                 GLUCOSE                  105 (H)             07/25/2023                BUN                      14                  07/25/2023  CREATININE               0.79                07/25/2023                CALCIUM                  9.0                 07/25/2023                EGFR                     59 (L)              11/28/2021                GFRNONAA                 >60                 07/25/2023                Musculoskeletal negative musculoskeletal ROS (+)    Abdominal   Peds  Hematology negative hematology ROS (+) Lab Results      Component                Value               Date                      WBC                      6.4                 07/25/2023                HGB                      12.7                07/25/2023                HCT                      38.3                07/25/2023                MCV                      99.5                07/25/2023                PLT                      244                 07/25/2023              Anesthesia Other Findings   Reproductive/Obstetrics                              Anesthesia Physical Anesthesia Plan  ASA: 3  Anesthesia Plan: General   Post-op Pain Management: Ofirmev  IV (intra-op)*   Induction: Intravenous  PONV Risk Score and Plan: 4 or greater and Ondansetron and Dexamethasone  Airway Management Planned: Oral ETT  Additional Equipment: Arterial line  Intra-op Plan:   Post-operative Plan: Extubation in OR  Informed Consent: I have reviewed the patients History and Physical, chart, labs and discussed the procedure including the risks, benefits and alternatives for the proposed anesthesia with the patient or authorized representative who has indicated his/her understanding and acceptance.     Dental advisory given  Plan Discussed with: CRNA  Anesthesia Plan Comments:          Anesthesia Quick Evaluation

## 2023-07-29 ENCOUNTER — Encounter (HOSPITAL_COMMUNITY): Payer: Self-pay | Admitting: Vascular Surgery

## 2023-07-29 DIAGNOSIS — Z87891 Personal history of nicotine dependence: Secondary | ICD-10-CM | POA: Diagnosis not present

## 2023-07-29 DIAGNOSIS — I771 Stricture of artery: Secondary | ICD-10-CM | POA: Diagnosis not present

## 2023-07-29 DIAGNOSIS — I639 Cerebral infarction, unspecified: Secondary | ICD-10-CM | POA: Diagnosis not present

## 2023-07-29 DIAGNOSIS — I10 Essential (primary) hypertension: Secondary | ICD-10-CM | POA: Diagnosis not present

## 2023-07-29 LAB — CBC
HCT: 26.2 % — ABNORMAL LOW (ref 36.0–46.0)
Hemoglobin: 8.8 g/dL — ABNORMAL LOW (ref 12.0–15.0)
MCH: 34 pg (ref 26.0–34.0)
MCHC: 33.6 g/dL (ref 30.0–36.0)
MCV: 101.2 fL — ABNORMAL HIGH (ref 80.0–100.0)
Platelets: 190 10*3/uL (ref 150–400)
RBC: 2.59 MIL/uL — ABNORMAL LOW (ref 3.87–5.11)
RDW: 12.4 % (ref 11.5–15.5)
WBC: 12.3 10*3/uL — ABNORMAL HIGH (ref 4.0–10.5)
nRBC: 0 % (ref 0.0–0.2)

## 2023-07-29 LAB — BASIC METABOLIC PANEL
Anion gap: 8 (ref 5–15)
BUN: 12 mg/dL (ref 8–23)
CO2: 24 mmol/L (ref 22–32)
Calcium: 8.6 mg/dL — ABNORMAL LOW (ref 8.9–10.3)
Chloride: 109 mmol/L (ref 98–111)
Creatinine, Ser: 0.7 mg/dL (ref 0.44–1.00)
GFR, Estimated: >60 mL/min (ref >60)
Glucose, Bld: 113 mg/dL — ABNORMAL HIGH (ref 70–99)
Potassium: 4 mmol/L (ref 3.5–5.1)
Sodium: 141 mmol/L (ref 135–145)

## 2023-07-29 MED ORDER — OXYCODONE-ACETAMINOPHEN 5-325 MG PO TABS: 1.0000 | ORAL_TABLET | Freq: Four times a day (QID) | ORAL | 0 refills | Status: DC | PRN

## 2023-07-29 MED ORDER — DILTIAZEM HCL ER COATED BEADS 120 MG PO CP24: 120.0000 mg | ORAL_CAPSULE | Freq: Every day | ORAL | 1 refills | Status: AC

## 2023-07-29 MED ORDER — ROSUVASTATIN CALCIUM 40 MG PO TABS: 40.0000 mg | ORAL_TABLET | Freq: Every day | ORAL | 1 refills | Status: DC

## 2023-07-29 MED ORDER — CLOPIDOGREL BISULFATE 75 MG PO TABS: 75.0000 mg | ORAL_TABLET | Freq: Every day | ORAL | 1 refills | Status: DC

## 2023-07-29 NOTE — TOC Transition Note (Signed)
 Transition of Care [facility]) - Discharge Note Donn Pierini RN, BSN Transitions of Care Unit 4E- RN Case Manager See Treatment Team for direct phone #   Patient Details  Name: Kristin Schmidt MRN: [id number] Date of Birth: Jul 15, 1955  [facility] of [facility]) CM/SW Contact:  Darrold Span, RN Phone Number: 07/29/2023, 11:29 AM   Clinical Narrative:    Pt s/p CEA, stable for transition home today w/ daughter. Family to transport home.  No HH or DME needs noted. Pt voiced she has RW at home.   TOC to sign off as no further interventions noted.    Final next level of care: Home/Self Care Barriers to Discharge: No Barriers Identified   Patient Goals and CMS Choice    To return home and get better        Discharge Placement               Home        Discharge Plan and Services Additional resources added to the After Visit Summary for                                       Social Drivers of Health (SDOH) Interventions SDOH Screenings   Food Insecurity: No Food Insecurity (07/24/2023)  Housing: Low Risk  (07/24/2023)  Transportation Needs: No Transportation Needs (07/24/2023)  Utilities: Not At Risk (07/24/2023)  Social Connections: Moderately Isolated (07/24/2023)  Tobacco Use: Medium Risk (07/28/2023)     Readmission Risk Interventions    07/29/2023   11:29 AM  Readmission Risk Prevention Plan  Post Dischage Appt Complete  Medication Screening Complete  Transportation Screening Complete

## 2023-07-29 NOTE — Progress Notes (Signed)
 Discharge instructions (including medications) discussed with and copy provided to patient/caregiver. Patient verbalized understanding. PIV x 3 removed and patient already dressed.

## 2023-07-29 NOTE — Discharge Summary (Signed)
 Physician Discharge Summary  Kristin Schmidt NWG:[id number] DOB: 17-Nov-1955 DOA: 07/23/2023  PCP: Eather Colas, FNP  Admit date: 07/23/2023 Discharge date: 07/29/23  Admitted From: Home Disposition: Home Recommendations for Outpatient Follow-up:  Follow up with PCP in 1 to 2 weeks Outpatient follow-up with vascular surgery in 2 to 3 weeks Outpatient follow-up with neurology as below Check blood pressure, HR, CMP and CBC in 1 week Please follow up on the following pending results: None  Home Health: No need identified Equipment/Devices: No need identified  Discharge Condition: Stable CODE STATUS: Full code  Follow-up Information     [facility]. Schedule an appointment as soon as possible for a visit in 1 month(s).   Specialty: Neurology Why: stroke clinic Contact information: [address] [place] [address] [phone]        [facility] Follow up in 3 week(s).   Specialty: Vascular Surgery Why: Office will call to arrange your appt(s) Contact information: [address] [place] [address] [phone]        Eather Colas, FNP. Schedule an appointment as soon as possible for a visit in 1 week(s).   Specialty: Nurse Practitioner Contact information: [address] [place] [address] [phone]                 Hospital course 68 year old F with PMH of HTN, HLD, tachycardia, EtOH use, anxiety, insomnia and tobacco use disorder presenting with right-sided weakness and left facial droop noted by patient's daughter, and admitted for CVA workup.   In ED, stable vitals.  Neurology consulted.  Noncontrast head CT showed small area of decreased density and loss of gray-white differentiation in the posterior right frontal lobe may represent an area of subacute ischemia.  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  No associated hemorrhage or mass effect.  Patient was started on Plavix and aspirin   Patient underwent left CEA with bovine patch angioplasty by Dr. Chestine Spore on 07/28/2023.  She has postoperative ABLA with hemoglobin dropping from 12.7-8.8.  Estimated EBL about 400 cc.  On the day of discharge, patient felt well and ready to go home.  She is cleared for discharge by vascular surgery and neurology.  She is discharged on aspirin, Plavix and Crestor.  Home Cardizem CD resumed at a lower dose.   See individual problem list below for more.   Problems addressed during this hospitalization Acute ischemic CVA: Presented with right-sided weakness, left facial droop.  MRI revealed 2.3cm acute left ACA distribution infarct involving the parasagittal left frontal lobe/cingulate gyrus.  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  TTE without significant finding.  LDL 126.  A1c 5.3%.  Symptoms resolved. -S/p  left carotid endarterectomy with bovine patch angioplasty by Dr. Chestine Spore on 3/24 -Cleared for discharge by neurology and cardiology on Plavix, aspirin and Crestor. -Encouraged smoking cessation. 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 Chestine Spore on 3/24 -Antiplatelets as above   Essential  hypertension: Soft blood pressure this afternoon.  Slightly tachycardic.  EBL about 400 cc. -Resume home Cardizem at reduced dose. -Continue metoprolol as needed   Alcohol use disorder: EtOH level 41.  No withdrawal symptoms. -Encouraged moderation.   Anxiety/insomnia: Stable -Continue home meds   Hyperlipidemia: LDL 126.  Reports statin intolerance but tolerating Lipitor here. -Continue Crestor.   Tobacco use disorder: Reports smoking half a pack a day -Encouraged cessation   Right foot pain after fall: X-ray of right foot negative for fracture or dislocation but hallux valgus with degenerative changes of the first metatarsophalangeal joint.  Exam reassuring. -Tylenol as needed   Right eye pain: Traumatic?  Exam with some conjunctival injection.  Cornea clear.  No visual impairment.  PERRL.  Erythema and pain almost resolved after ketorolac eyedrops            Time spent 35 minutes  Vital signs Vitals:   07/29/23 0253 07/29/23 0347 07/29/23 0814 07/29/23 0815  BP: (!) 99/57 110/65 (!) 120/55 (!) 120/55  Pulse: 94 96 (!) 112 (!) 105  Temp: 97.6 F (36.4 C) 97.6 F (36.4 C) (!) 97.5 F (36.4 C) (!) 97.5 F (36.4 C)  Resp: 12 14 19 15   Height:      Weight:      SpO2: 91% 93% 98% 97%  TempSrc: Oral  Oral   BMI (Calculated):         Discharge exam  GENERAL: No apparent distress.  Nontoxic. HEENT: MMM.  Vision and hearing grossly intact.  NECK: Supple.  No apparent JVD. S/p left DA.  Wound DCI. RESP:  No IWOB.  Fair aeration bilaterally. CVS: Slightly tachycardic.  Regular rhythm. Heart sounds normal.  ABD/GI/GU: BS+. Abd soft, NTND.  MSK/EXT:  Moves extremities. No apparent deformity. No edema.  SKIN: Left neck surgical wound DCI. NEURO: Awake and alert. Oriented appropriately.  No apparent focal neuro deficit. PSYCH: Calm. Normal affect.  Discharge Instructions Discharge Instructions     Ambulatory referral to Neurology   Complete by: As directed     Follow up with stroke clinic NP at [facility] in about 4-6 weeks. Thanks.   Diet - low sodium heart healthy   Complete by: As directed    Discharge instructions   Complete by: As directed    It has been a pleasure taking care of you!  You were hospitalized due to stroke and carotid artery stenosis (narrowing of blood vessels in your neck) for which you have been treated surgically medically.  Your symptoms improved.  We are discharging you on Plavix, aspirin and Crestor.  He has been important that you take these medications as prescribed.  Please review your new medication list and the directions on your medications before you take them.  Avoid any over-the-counter pain medication other than plain Tylenol.  Follow-up with vascular surgery per their recommendation.  Follow-up with your primary care doctor in 1 to 2 weeks or sooner if needed.  It is important that you quit smoking cigarettes.  You may use nicotine patch to help you quit smoking.  Nicotine patch is available over-the-counter.  You may also discuss other options to help you quit smoking with your primary care doctor. You can also talk to professional counselors at 1-800-QUIT-NOW 618-857-7113) for free smoking cessation counseling.     Take care,  Increase activity slowly   Complete by: As directed       Allergies as of 07/29/2023       Reactions   Dilaudid [hydromorphone Hcl] Other (See Comments)   Family request. Patient becomes heavily sedated and becomes unresponsive. Hypersensitive to dilaudid.   Penicillins Itching   HIVES 05/24/22 tolerated Cefazolin        Medication List     STOP taking these medications    ibuprofen 200 MG tablet Commonly known as: ADVIL   Magnesium Oxide 250 MG Tabs   pravastatin 10 MG tablet Commonly known as: PRAVACHOL       TAKE these medications    acetaminophen 325 MG tablet Commonly known as: TYLENOL Take 650 mg by mouth every 6 (six) hours as needed.   aspirin EC 81 MG  tablet Take 81 mg by mouth daily. Swallow whole.   clopidogrel 75 MG tablet Commonly known as: PLAVIX Take 1 tablet (75 mg total) by mouth daily. Start taking on: July 30, 2023   diltiazem 120 MG 24 hr capsule Commonly known as: CARDIZEM CD Take 1 capsule (120 mg total) by mouth daily. Start taking on: July 31, 2023 What changed:  medication strength how much to take when to take this These instructions start on July 31, 2023. If you are unsure what to do until then, ask your doctor or other care provider.   escitalopram 20 MG tablet Commonly known as: LEXAPRO Take 20 mg by mouth daily.   metoprolol tartrate 25 MG tablet Commonly known as: LOPRESSOR TAKE 1 TABLET BY MOUTH 2 (TWO) TIMES DAILY AS NEEDED (IF TOP BLOOD PRESSURE IS OVER 160).   Milk Thistle Caps Take 1 capsule by mouth 2 (two) times daily.   nicotine 21 mg/24hr patch Commonly known as: NICODERM CQ - dosed in mg/24 hours Place 21 mg onto the skin daily as needed (for smoking).   oxyCODONE-acetaminophen 5-325 MG tablet Commonly known as: PERCOCET/ROXICET Take 1 tablet by mouth every 6 (six) hours as needed (sever 4-8 pain).   rosuvastatin 40 MG tablet Commonly known as: CRESTOR Take 1 tablet (40 mg total) by mouth at bedtime.   traZODone 150 MG tablet Commonly known as: DESYREL Take 150 mg by mouth at bedtime.   TURMERIC PO Take 1 tablet by mouth daily.   VITA-C PO Take 2 capsules by mouth daily.   VITAMIN D3 PO Take 1 capsule by mouth daily.   zinc gluconate 50 MG tablet Take 50 mg by mouth daily.        Consultations: Neurology Vascular surgery  Procedures/Studies: 3/24-left carotid endarterectomy with bovine patch angioplasty by Dr. Chestine Spore    ECHOCARDIOGRAM COMPLETE Result Date: 07/24/2023    ECHOCARDIOGRAM REPORT   Patient Name:   LAZETTE ESTALA Date of Exam: 07/24/2023 Medical Rec #:  [id number]              Height:       62.0 in Accession #:    [id number]              Weight:       147.0 lb Date of Birth:  Feb 22, 1956               BSA:          1.677 m Patient Age:    67 years               BP:           162/82 mmHg Patient Gender:  F                      HR:           91 bpm. Exam Location:  Inpatient Procedure: 2D Echo, Color Doppler, Cardiac Doppler and Intracardiac            Opacification Agent (Both Spectral and Color Flow Doppler were            utilized during procedure). Indications:    TIA G45.9  History:        Patient has prior history of Echocardiogram examinations, most                 recent 04/16/2019.  Sonographer:    Harriette Bouillon RDCS Referring Phys: [id number] Oliver Pila HALL  Sonographer Comments: Technically difficult study due to poor echo windows. IMPRESSIONS  1. Left ventricular ejection fraction, by estimation, is 60 to 65%. The left ventricle has normal function. The left ventricle has no regional wall motion abnormalities. Diastolic function could not be evaluated due to suboptimal images.  2. Right ventricular systolic function is normal. The right ventricular size is normal. Mildly increased right ventricular wall thickness.  3. The mitral valve is normal in structure. Trivial mitral valve regurgitation. No evidence of mitral stenosis.  4. The aortic valve was not well visualized. Aortic valve regurgitation is not visualized.  5. The inferior vena cava is normal in size with greater than 50% respiratory variability, suggesting right atrial pressure of 3 mmHg. Conclusion(s)/Recommendation(s): No intracardiac source of embolism detected on this transthoracic study. Consider a transesophageal echocardiogram to exclude cardiac source of embolism if clinically indicated. FINDINGS  Left Ventricle: Left ventricular ejection fraction, by estimation, is 60 to 65%. The left ventricle has normal function. The left ventricle has no regional wall motion abnormalities. The left ventricular internal cavity size was normal in size. There is  no left ventricular  hypertrophy. Diastolic function could not be evaluated due to suboptimal images. Right Ventricle: The right ventricular size is normal. Mildly increased right ventricular wall thickness. Right ventricular systolic function is normal. Left Atrium: Left atrial size was normal in size. Right Atrium: Right atrial size was normal in size. Pericardium: There is no evidence of pericardial effusion. Mitral Valve: The mitral valve is normal in structure. Trivial mitral valve regurgitation. No evidence of mitral valve stenosis. Tricuspid Valve: The tricuspid valve is grossly normal. Tricuspid valve regurgitation is not demonstrated. No evidence of tricuspid stenosis. Aortic Valve: The aortic valve was not well visualized. Aortic valve regurgitation is not visualized. Pulmonic Valve: The pulmonic valve was not well visualized. Pulmonic valve regurgitation is not visualized. No evidence of pulmonic stenosis. Aorta: The aortic root and ascending aorta are structurally normal, with no evidence of dilitation. Venous: The inferior vena cava is normal in size with greater than 50% respiratory variability, suggesting right atrial pressure of 3 mmHg. IAS/Shunts: The atrial septum is grossly normal.  LEFT VENTRICLE PLAX 2D LVIDd:         4.40 cm   Diastology LVIDs:         2.90 cm   LV e' medial:  7.29 cm/s LV PW:         0.90 cm   LV e' lateral: 6.74 cm/s LV IVS:        0.90 cm LVOT diam:     2.00 cm LV SV:         41 LV SV Index:   24 LVOT  Area:     3.14 cm  IVC IVC diam: 1.50 cm LEFT ATRIUM           Index LA diam:      3.40 cm 2.03 cm/m LA Vol (A4C): 25.5 ml 15.20 ml/m  AORTIC VALVE LVOT Vmax:   63.90 cm/s LVOT Vmean:  39.300 cm/s LVOT VTI:    0.130 m  AORTA Ao Root diam: 2.20 cm Ao Asc diam:  2.50 cm  SHUNTS Systemic VTI:  0.13 m Systemic Diam: 2.00 cm Sunit Tolia Electronically signed by Tessa Lerner Signature Date/Time: 07/24/2023/4:15:21 PM    Final    MR BRAIN WO CONTRAST Result Date: 07/24/2023 CLINICAL DATA:  Follow-up  examination for acute stroke. EXAM: MRI HEAD WITHOUT CONTRAST TECHNIQUE: Multiplanar, multiecho pulse sequences of the brain and surrounding structures were obtained without intravenous contrast. COMPARISON:  Comparison made with prior CTA from earlier same day as well as prior CT from 07/23/2023. FINDINGS: Brain: Cerebral volume within normal limits. Patchy T2/FLAIR hyperintensity involving the periventricular and deep white matter both cerebral hemispheres, consistent with chronic small vessel ischemic disease, mild in nature. Few scattered remote lacunar infarcts present about the hemispheric cerebral white matter and thalami. 2.3 cm focus of restricted diffusion involving the parasagittal left frontal lobe/cingulate gyrus, consistent with an of acute left ACA distribution infarct (series 5, image 90). No associated hemorrhage or mass effect. No other evidence for acute or subacute ischemia. Gray-white matter differentiation otherwise maintained. No acute or chronic intracranial blood products. No mass lesion, midline shift or mass effect. No hydrocephalus or extra-axial fluid collection. Pituitary gland within normal limits. Vascular: Major intracranial vascular flow voids are maintained. Skull and upper cervical spine: Craniocervical junction within normal limits. Bone marrow signal intensity normal. No scalp soft tissue abnormality. Sinuses/Orbits: Globes normal soft tissues within normal limits. Paranasal sinuses are largely clear. No mastoid effusion. Other: None. IMPRESSION: 1. 2.3 cm acute left ACA distribution infarct involving the parasagittal left frontal lobe/cingulate gyrus. No associated hemorrhage or mass effect. 2. Underlying mild chronic microvascular ischemic disease with a few scattered remote lacunar infarcts about the hemispheric cerebral white matter and thalami. Electronically Signed   By: Rise Mu M.D.   On: 07/24/2023 03:34   CT ANGIO HEAD NECK W WO CM Result Date:  07/24/2023 CLINICAL DATA:  Initial evaluation for acute neuro deficit, stroke suspected. EXAM: CT ANGIOGRAPHY HEAD AND NECK WITH AND WITHOUT CONTRAST TECHNIQUE: Multidetector CT imaging of the head and neck was performed using the standard protocol during bolus administration of intravenous contrast. Multiplanar CT image reconstructions and MIPs were obtained to evaluate the vascular anatomy. Carotid stenosis measurements (when applicable) are obtained utilizing NASCET criteria, using the distal internal carotid diameter as the denominator. RADIATION DOSE REDUCTION: This exam was performed according to the departmental dose-optimization program which includes automated exposure control, adjustment of the mA and/or kV according to patient size and/or use of iterative reconstruction technique. CONTRAST:  75mL OMNIPAQUE IOHEXOL 350 MG/ML SOLN COMPARISON:  Prior CT from 07/23/2023. FINDINGS: CTA NECK FINDINGS Aortic arch: Visualized arch within normal limits for caliber with standard 3 vessel morphology. Advanced atheromatous change about the arch and origin of the great vessels. Associated 60% stenosis involving the proximal left subclavian artery (series 6, image 10). Right carotid system: Right common and internal carotid arteries are patent without dissection. Atheromatous change about the right carotid bulb with associated stenosis of up to 65% by NASCET criteria. Left carotid system: Left common and internal carotid arteries are patent  without dissection. Bulky calcified plaque about the left carotid bulb with resultant severe near occlusive stenosis (series 7, image 138). A radiographic string sign is present. Vertebral arteries: Both vertebral arteries arise from subclavian arteries. Atheromatous change at the origin of the right vertebral artery with mild stenosis. Vertebral arteries otherwise patent without stenosis or dissection. Skeleton: No discrete or worrisome osseous lesions. Other neck: No other acute  finding. Upper chest: No other acute finding. Review of the MIP images confirms the above findings CTA HEAD FINDINGS Anterior circulation: Atheromatous change about the carotid siphons with no more than mild multifocal narrowing bilaterally. Left A1 segment patent. Normal anterior communicating artery complex. Anterior cerebral arteries patent without stenosis. No M1 stenosis or occlusion. 3 mm saccular aneurysm seen extending inferiorly from the left MCA bifurcation. Distal MCA branches perfused and fairly symmetric. Posterior circulation: Right vertebral artery slightly dominant. Atheromatous plaque within the mid right V4 segment with short-segment mild stenosis. Left V4 segment widely patent. Left PICA patent. Right PICA not well seen. Basilar widely patent without stenosis. Superior cerebellar and posterior cerebral arteries patent bilaterally. Venous sinuses: Patent allowing for timing the contrast bolus. Anatomic variants: As above. Review of the MIP images confirms the above findings IMPRESSION: 1. Negative CTA for acute large vessel occlusion or other emergent finding. 2. Bulky calcified plaque about the left carotid bulb with resultant severe near occlusive stenosis. 3. Atheromatous change about the right carotid bulb with associated stenosis of up to 65% by NASCET criteria. 4. 60% stenosis involving the proximal left subclavian artery. 5. 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 6.  Aortic Atherosclerosis (ICD10-I70.0). Electronically Signed   By: Rise Mu M.D.   On: 07/24/2023 01:53   CT HEAD WO CONTRAST Result Date: 07/23/2023 CLINICAL DATA:  Transient ischemic attack. Left facial droop and right-sided weakness, now resolved. EXAM: CT HEAD WITHOUT CONTRAST TECHNIQUE: Contiguous axial images were obtained from the base of the skull through the vertex without intravenous contrast. RADIATION DOSE REDUCTION: This exam was performed according to the  departmental dose-optimization program which includes automated exposure control, adjustment of the mA and/or kV according to patient size and/or use of iterative reconstruction technique. COMPARISON:  None Available. FINDINGS: Brain: Small area of decreased density and loss of gray-white differentiation in the posterior right frontal lobe, series 3, image 22. This may represent an area of subacute ischemia. No mass effect. No intracranial hemorrhage. No hydrocephalus. Brain volume is normal for age. No subdural or extra-axial collection. No midline shift or evidence of mass lesion. Vascular: Atherosclerosis of skullbase vasculature without hyperdense vessel or abnormal calcification. Skull: No fracture or focal lesion. Sinuses/Orbits: Paranasal sinuses and mastoid air cells are clear. The visualized orbits are unremarkable. Other: None. IMPRESSION: Small area of decreased density and loss of gray-white differentiation in the posterior right frontal lobe, may represent an area of subacute ischemia. Recommend MRI for further assessment Electronically Signed   By: Narda Rutherford M.D.   On: 07/23/2023 21:09   DG Foot Complete Right Result Date: 07/23/2023 CLINICAL DATA:  Right foot pain after fall. EXAM: RIGHT FOOT COMPLETE - 3+ VIEW COMPARISON:  None Available. FINDINGS: There is no evidence of fracture or dislocation. Hallux valgus with degenerative change of the first metatarsal phalangeal joint. Soft tissues are unremarkable. IMPRESSION: 1. No fracture or dislocation of the right foot. 2. Hallux valgus with degenerative change of the first metatarsophalangeal joint. Electronically Signed   By: Narda Rutherford M.D.   On: 07/23/2023  21:02   DG Hip Unilat W or Wo Pelvis 2-3 Views Right Result Date: 07/23/2023 CLINICAL DATA:  Pain after fall. EXAM: DG HIP (WITH OR WITHOUT PELVIS) 2-3V RIGHT COMPARISON:  05/24/2022 FINDINGS: ORIF of right proximal femur fracture. No periprosthetic lucency. No evidence of acute  fracture of the pelvis or right hip. Pubic rami are intact. Pubic symphysis and sacroiliac joints are congruent. IMPRESSION: 1. No acute fracture of the pelvis or right hip. 2. ORIF of remote right proximal femur fracture. No hardware complication. Electronically Signed   By: Narda Rutherford M.D.   On: 07/23/2023 21:00       The results of significant diagnostics from this hospitalization (including imaging, microbiology, ancillary and laboratory) are listed below for reference.     Microbiology: Recent Results (from the past 240 hours)  MRSA Next Gen by PCR, Nasal     Status: None   Collection Time: 07/27/23 10:03 PM   Specimen: Nasal Mucosa; Nasal Swab  Result Value Ref Range Status   MRSA by PCR Next Gen NOT DETECTED NOT DETECTED Final    Comment: (NOTE) The GeneXpert MRSA Assay (FDA approved for NASAL specimens only), is one component of a comprehensive MRSA colonization surveillance program. It is not intended to diagnose MRSA infection nor to guide or monitor treatment for MRSA infections. Test performance is not FDA approved in patients less than 35 years old. Performed at [facility], [address], [place], [place] [address]      Labs:  CBC: Recent Labs  Lab 07/23/23 1930 07/24/23 0030 07/25/23 0546 07/28/23 1426 07/29/23 0430  WBC 8.4 9.1 6.4 10.9* 12.3*  HGB 14.0 13.3 12.7 9.5* 8.8*  HCT 42.5 40.3 38.3 28.6* 26.2*  MCV 102.2* 101.5* 99.5 101.8* 101.2*  PLT 291 266 244 192 190   BMP &GFR Recent Labs  Lab 07/23/23 1930 07/24/23 0030 07/25/23 0546 07/28/23 1426 07/29/23 0430  NA 136  --  140  --  141  K 3.7  --  3.8  --  4.0  CL 103  --  103  --  109  CO2 22  --  24  --  24  GLUCOSE 130*  --  105*  --  113*  BUN 10  --  14  --  12  CREATININE 0.91 0.96 0.79 0.80 0.70  CALCIUM 9.0  --  9.0  --  8.6*  MG  --   --  1.8  --   --   PHOS  --   --  4.3  --   --    Estimated Creatinine Clearance: 59.4 mL/min (by C-G formula based on SCr of 0.7  mg/dL). Liver & Pancreas: Recent Labs  Lab 07/23/23 1930 07/25/23 0546  AST 20  --   ALT 19  --   ALKPHOS 57  --   BILITOT 0.4  --   PROT 6.6  --   ALBUMIN 3.6 3.1*   No results for input(s): "LIPASE", "AMYLASE" in the last 168 hours. No results for input(s): "AMMONIA" in the last 168 hours. Diabetic: No results for input(s): "HGBA1C" in the last 72 hours. No results for input(s): "GLUCAP" in the last 168 hours. Cardiac Enzymes: No results for input(s): "CKTOTAL", "CKMB", "CKMBINDEX", "TROPONINI" in the last 168 hours. No results for input(s): "PROBNP" in the last 8760 hours. Coagulation Profile: No results for input(s): "INR", "PROTIME" in the last 168 hours. Thyroid Function Tests: No results for input(s): "TSH", "T4TOTAL", "FREET4", "T3FREE", "THYROIDAB" in the  last 72 hours. Lipid Profile: No results for input(s): "CHOL", "HDL", "LDLCALC", "TRIG", "CHOLHDL", "LDLDIRECT" in the last 72 hours. Anemia Panel: No results for input(s): "VITAMINB12", "FOLATE", "FERRITIN", "TIBC", "IRON", "RETICCTPCT" in the last 72 hours. Urine analysis:    Component Value Date/Time   COLORURINE YELLOW 10/25/2013 1128   APPEARANCEUR CLEAR 10/25/2013 1128   LABSPEC 1.002 (L) 10/25/2013 1128   PHURINE 5.5 10/25/2013 1128   GLUCOSEU NEGATIVE 10/25/2013 1128   HGBUR NEGATIVE 10/25/2013 1128   BILIRUBINUR NEGATIVE 10/25/2013 1128   KETONESUR NEGATIVE 10/25/2013 1128   PROTEINUR NEGATIVE 10/25/2013 1128   UROBILINOGEN 0.2 10/25/2013 1128   NITRITE NEGATIVE 10/25/2013 1128   LEUKOCYTESUR NEGATIVE 10/25/2013 1128   Sepsis Labs: Invalid input(s): "PROCALCITONIN", "LACTICIDVEN"   SIGNED:  Almon Hercules, MD  [facility] 07/29/2023, 2:19 PM

## 2023-07-29 NOTE — Progress Notes (Addendum)
  Progress Note    07/29/2023 7:56 AM 1 Day Post-Op  Subjective:  no new neurological symptoms. Says her right eye feels about the same    Vitals:   07/29/23 0253 07/29/23 0347  BP: (!) 99/57 110/65  Pulse: 94 96  Resp: 12 14  Temp: 97.6 F (36.4 C) 97.6 F (36.4 C)  SpO2: 91% 93%    Physical Exam: General:  resting comfortably, NAD Cardiac:  regular rhythm, tachy Lungs:  nonlabored Incisions:  left sided neck incision c/d/l without hematoma Extremities:  moving all extremities without deficit Neuro: alert and oriented x4. No slurred speech, facial droop, or tongue deviation  CBC    Component Value Date/Time   WBC 12.3 (H) 07/29/2023 0430   RBC 2.59 (L) 07/29/2023 0430   HGB 8.8 (L) 07/29/2023 0430   HGB 13.6 03/19/2019 1335   HCT 26.2 (L) 07/29/2023 0430   HCT 39.1 03/19/2019 1335   PLT 190 07/29/2023 0430   PLT 323 03/19/2019 1335   MCV 101.2 (H) 07/29/2023 0430   MCV 94 03/19/2019 1335   MCH 34.0 07/29/2023 0430   MCHC 33.6 07/29/2023 0430   RDW 12.4 07/29/2023 0430   RDW 13.3 03/19/2019 1335   LYMPHSABS 2.2 10/25/2013 1121   MONOABS 0.7 10/25/2013 1121   EOSABS 0.0 10/25/2013 1121   BASOSABS 0.1 10/25/2013 1121    BMET    Component Value Date/Time   NA 141 07/29/2023 0430   NA 131 (L) 11/28/2021 0942   K 4.0 07/29/2023 0430   CL 109 07/29/2023 0430   CO2 24 07/29/2023 0430   GLUCOSE 113 (H) 07/29/2023 0430   BUN 12 07/29/2023 0430   BUN 22 11/28/2021 0942   CREATININE 0.70 07/29/2023 0430   CALCIUM 8.6 (L) 07/29/2023 0430   GFRNONAA >60 07/29/2023 0430   GFRAA 107 03/19/2019 1335    INR No results found for: "INR"   Intake/Output Summary (Last 24 hours) at 07/29/2023 0756 Last data filed at 07/29/2023 1610 Gross per 24 hour  Intake 2274.56 ml  Output 25 ml  Net 2249.56 ml      Assessment/Plan:  68 y.o. female is 1 day post op, s/p: L CEA   -Doing well this morning without any complaints.  She remains neurologically intact.  She  says that her right eye is still somewhat bothering her, however this is stable compared to preop -Left-sided neck incision dry and intact without hematoma -Hemoglobin with slight decrease to 8.8. Likely due to intra-op blood loss. No signs of further bleeding or indications for transfusion -Hemodynamically stable. HR slightly tachy but this was present pre-op -She is tolerating a normal diet without swallowing difficulty -Okay to mobilize as tolerated. Stable for discharge from vascular perspective. Will arrange follow up in 2-3 wks for incision check. Continue asa, plavix, and statin   Loel Dubonnet, PA-C [facility] [phone] 07/29/2023 7:56 AM  I have seen and evaluated the patient. I agree with the PA note as documented above.  Postop day 1 status post left carotid endarterectomy for symptomatic high-grade stenosis.  Neck looks good.  Grossly neurologically intact.  Hemoglobin 8.8, acute blood loss anemia.  Overall looks good.  Continue aspirin Plavix statin.  Will arrange follow-up in 2 to 3 weeks for incision check.  If she mobilizes well today and her blood pressures are okay should be okay for discharge pending hospitalist evaluation  Cephus Shelling, MD [facility] [place] Office: [phone]

## 2023-07-30 LAB — POCT ACTIVATED CLOTTING TIME
Activated Clotting Time: 262 s
Activated Clotting Time: 297 s
Activated Clotting Time: 141 s

## 2023-07-31 NOTE — Anesthesia Postprocedure Evaluation (Signed)
 Anesthesia Post Note  Patient: Kristin Schmidt  Procedure(s) Performed: ENDARTERECTOMY, CAROTID LEFT (Left) ANGIOPLASTY, USING PATCH GRAFT LEFT CAROTID (Left)     Patient location during evaluation: PACU Anesthesia Type: General Level of consciousness: awake and alert Pain management: pain level controlled Vital Signs Assessment: post-procedure vital signs reviewed and stable Respiratory status: spontaneous breathing, nonlabored ventilation and respiratory function stable Cardiovascular status: blood pressure returned to baseline and stable Postop Assessment: no apparent nausea or vomiting Anesthetic complications: no   No notable events documented.                 Garnell Begeman

## 2023-08-09 ENCOUNTER — Other Ambulatory Visit: Payer: Self-pay | Admitting: Cardiology

## 2023-08-14 NOTE — Patient Instructions (Signed)
 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 Continue aspirin 81 mg daily and clopidogrel 75 mg daily and atorvastatin 80mg  daily for secondary stroke prevention.  Discussed secondary stroke prevention measures and importance of close PCP follow up for aggressive stroke risk factor management. I have gone over the pathophysiology of stroke, warning signs and symptoms, risk factors and their management in some detail with instructions to go to the closest emergency room for symptoms of concern. HTN: BP goal <130/90.  Stable. Continue diltiazem and metoprolol per PCP HLD: LDL goal <70. Recent LDL 126. Continue rosuvastatin 40mg  daily per PCP. Follow up closely if muscle aches do not improve in the next two weeks. Well balanced diet and regular exercise advised.  DMII: A1c goal<7.0. Recent A1c 5.3. Not diabetic. Continue healthy well balanced diet.  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 Consider referral to neurosurgery with any increased growth.  Carotid stenosis: s/p CEA 07/29/2023. Continue close follow up with vascular surgery. Continue rosuvastatin  Tobacco use: continue cessation. Continue nicotine patches.  ETOH: continue in moderation. I recommend 1 glass of wine or less per day.   Goals:  1) Maintain strict control of hypertension with blood pressure goal below 130/90 2) Maintain good control of diabetes with hemoglobin A1c goal below 7%  3) Maintain good control of lipids with LDL cholesterol goal below 70 mg/dL.  4) Eat a healthy diet with plenty of whole grains, cereals, fruits and vegetables, exercise regularly and maintain ideal body weight   Resources: https://[email or web address].biz/  Please make sure you are staying well hydrated. I recommend 50-60 ounces daily. Well balanced diet and regular exercise encouraged. Consistent sleep  schedule with 6-8 hours recommended.   Please continue follow up with care team as directed.   Follow up with me in 6 months   You may receive a survey regarding today's visit. I encourage you to leave honest feed back as I do use this information to improve patient care. Thank you for seeing me today!

## 2023-08-14 NOTE — Progress Notes (Signed)
 [facility] [address] [place]. [place] [address] [phone]       HOSPITAL FOLLOW UP NOTE  [name] Date of Birth:  June 20, 1955 Medical Record Number:  [id number]   Reason for Referral:  hospital stroke follow up    SUBJECTIVE:   CHIEF COMPLAINT:  Chief Complaint  Patient presents with   Follow-up    Pt in 1 with daughter Pt here for stroke f/u Pt states  increased headaches and some weakness in right hand     HPI:   Kristin Schmidt is a 68 y.o. who  has a past medical history of Complication of anesthesia, Coronary artery disease (2007), Hypertension, Pneumonia, Renal disorder, and Tachycardia.  Patient presented on 07/24/2023 for transient episode of right sided weakness, right facial droop lasting 10 minutes. CT showed questionable left frontal infarct. MCA showed left ACA infarct. CTA showed left ICA bulb severe stenosis and right ICA bulb 65% stenosis. Also noted to have 3mm left cerebral aneurysm. Vascular consulted and CEA planned for 07/28/23. LDL 126, TG 274, A1C 5.3. TNK not given due to resolution of symptoms. She was started on atorvastatin 80mg , asa 81 and Plavix. Personally reviewed hospitalization pertinent progress notes, lab work and imaging.  Evaluated by Dr Christiane Cowing.    Since discharge, she reports doing fairly well. She did note significant fatigue and sleepiness following discharge but this seems to be improving. Mild word finding difficulty that is also improving.   Previously on pravastatin a couple weeks prior to stroke. She reported significant muscle cramps and was switched to atorvastatin in the hospital. She had taken atorvastatin years ago and it caused significant muscle spasms and pain. Atorvastatin was switched to rosuvastatin by PCP about 2 weeks ago. She reports tolerating it fairly well but does note more stiffness in the mornings. She reports stiffness improves once she gets moving. She was previously very  active. She likes to use stationary peddler and hoping to resume soon.   CEA performed 3/25. She is recovering well. Follow up with vascular tomorrow, 4/15. She has continued asa and Plavix and tolerating well. No unusual bleeding. Aneurysm being monitored. No intervention advised at this time. Sister had ruptured aneurysm, managed by Dr Michale Age.   BP has been lower than usual. It was 140/60 this morning but daughter reports multiple low readings less than 120 sys at home. Cardizem was reduced from 180 to 120mg  daily. Metoprolol used only as needed. She has taken once in the past 2 months.   She is no longer smoking. Using nicotine patches. She does drink a glass of wine at bedtime.    PERTINENT IMAGING/LABS  CT questionable left frontal infarct CTA head and neck left ICA bulb severe stenosis, right ICA bulb 65% stenosis.  Bilateral siphon atherosclerosis left more than right.  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 MRI left ACA infarct 2D Echo EF 60 to 65%   A1C Lab Results  Component Value Date   HGBA1C 5.3 07/24/2023    Lipid Panel     Component Value Date/Time   CHOL 227 (H) 07/24/2023 0030   TRIG 274 (H) 07/24/2023 0030   HDL 46 07/24/2023 0030   CHOLHDL 4.9 07/24/2023 0030   VLDL 55 (H) 07/24/2023 0030   LDLCALC 126 (H) 07/24/2023 0030      ROS:   14 system review of systems performed and negative with exception of those listed in HPI  PMH:  Past Medical History:  Diagnosis Date   Complication of anesthesia    Pt wakes up confused   Coronary artery disease 2007   renal artery stent and subclavian artery stent - [place]    Hypertension    Pneumonia    "walking pneumonia"   Renal disorder    Tachycardia    pt was treated with Carvedilol, taken off of it in May, 2018 due to feeling fatigued.    PSH:  Past Surgical History:  Procedure Laterality Date   CESAREAN SECTION     ENDARTERECTOMY Left 07/28/2023   Procedure:  ENDARTERECTOMY, CAROTID LEFT;  Surgeon: Young Hensen, MD;  Location: [facility] OR;  Service: Vascular;  Laterality: Left;   FRACTURE SURGERY Left    leg   INTRAMEDULLARY (IM) NAIL INTERTROCHANTERIC Right 05/24/2022   Procedure: INTRAMEDULLARY NAILING OF RIGHT FEMUR;  Surgeon: Laneta Pintos, MD;  Location: [facility] OR;  Service: Orthopedics;  Laterality: Right;   ORIF HUMERUS FRACTURE Left 02/20/2017   Procedure: OPEN REDUCTION INTERNAL FIXATION (ORIF) LEFT PROXIMAL HUMERUS FRACTURE;  Surgeon: Ellard Gunning, MD;  Location: [facility] OR;  Service: Orthopedics;  Laterality: Left;   PATCH ANGIOPLASTY Left 07/28/2023   Procedure: ANGIOPLASTY, USING PATCH GRAFT LEFT CAROTID;  Surgeon: Young Hensen, MD;  Location: [facility] OR;  Service: Vascular;  Laterality: Left;   stent placement right kidney       Social History:  Social History   Socioeconomic History   Marital status: Single    Spouse name: Not on file   Number of children: Not on file   Years of education: Not on file   Highest education level: Not on file  Occupational History   Not on file  Tobacco Use   Smoking status: Former    Current packs/day: 0.00    Types: Cigarettes    Quit date: 11/20/2015    Years since quitting: 7.7   Smokeless tobacco: Never  Vaping Use   Vaping status: Never Used  Substance and Sexual Activity   Alcohol use: Yes    Alcohol/week: 7.0 standard drinks of alcohol    Types: 7 Glasses of wine per week    Comment: wine   Drug use: No   Sexual activity: Not on file  Other Topics Concern   Not on file  Social History Narrative   Pt lives with daughter   Retired    Social Drivers of Corporate investment banker Strain: Not on file  Food Insecurity: No Food Insecurity (07/24/2023)   Hunger Vital Sign    Worried About Running Out of Food in the Last Year: Never true    Ran Out of Food in the Last Year: Never true  Transportation Needs: No Transportation Needs (07/24/2023)   PRAPARE - Doctor, general practice (Medical): No    Lack of Transportation (Non-Medical): No  Physical Activity: Not on file  Stress: Not on file  Social Connections: Moderately Isolated (07/24/2023)   Social Connection and Isolation Panel [NHANES]    Frequency of Communication with Friends and Family: More than three times a week    Frequency of Social Gatherings with Friends and Family: Never    Attends Religious Services: 1 to 4 times per year    Active Member of [employer] or Organizations: No    Attends Banker Meetings: Never    Marital Status: Divorced  Catering manager Violence: Not At Risk (07/24/2023)   Humiliation, Afraid, Rape, and Kick questionnaire    Fear of Current or Ex-Partner:  No    Emotionally Abused: No    Physically Abused: No    Sexually Abused: No    Family History:  Family History  Problem Relation Age of Onset   COPD Mother    COPD Father    Cancer Sister    Heart attack Maternal Grandfather    Stroke Neg Hx     Medications:   Current Outpatient Medications on File Prior to Visit  Medication Sig Dispense Refill   acetaminophen (TYLENOL) 325 MG tablet Take 650 mg by mouth every 6 (six) hours as needed.     Ascorbic Acid (VITA-C PO) Take 2 capsules by mouth daily.     aspirin EC 81 MG tablet Take 81 mg by mouth daily. Swallow whole.     Cholecalciferol (VITAMIN D3 PO) Take 1 capsule by mouth daily.     clopidogrel (PLAVIX) 75 MG tablet Take 1 tablet (75 mg total) by mouth daily. 90 tablet 1   diltiazem (CARDIZEM CD) 120 MG 24 hr capsule Take 1 capsule (120 mg total) by mouth daily. 90 capsule 1   escitalopram (LEXAPRO) 20 MG tablet Take 20 mg by mouth daily.     metoprolol tartrate (LOPRESSOR) 25 MG tablet Take 1 tablet (25 mg total) by mouth 2 (two) times daily as needed. (IF TOP BLOOD PRESSURE IS OVER 160) 60 tablet 0   Misc Natural Products (MILK THISTLE) CAPS Take 1 capsule by mouth 2 (two) times daily.     nicotine (NICODERM CQ - DOSED IN MG/24 HOURS) 21  mg/24hr patch Place 21 mg onto the skin daily as needed (for smoking).     oxyCODONE-acetaminophen (PERCOCET/ROXICET) 5-325 MG tablet Take 1 tablet by mouth every 6 (six) hours as needed (sever 4-8 pain). 15 tablet 0   rosuvastatin (CRESTOR) 40 MG tablet Take 1 tablet (40 mg total) by mouth at bedtime. 90 tablet 1   traZODone (DESYREL) 150 MG tablet Take 150 mg by mouth at bedtime.     TURMERIC PO Take 1 tablet by mouth daily.     zinc gluconate 50 MG tablet Take 50 mg by mouth daily.     No current facility-administered medications on file prior to visit.    Allergies:   Allergies  Allergen Reactions   Dilaudid [Hydromorphone Hcl] Other (See Comments)    Family request. Patient becomes heavily sedated and becomes unresponsive. Hypersensitive to dilaudid.   Penicillins Itching    HIVES 05/24/22 tolerated Cefazolin      OBJECTIVE:  Physical Exam  Vitals:   08/18/23 0843  BP: 110/70  Pulse: 80  SpO2: 99%  Weight: 140 lb (63.5 kg)  Height: 5\' 1"  (1.549 m)   Body mass index is 26.45 kg/m. No results found.      No data to display           General: well developed, well nourished, seated, in no evident distress Head: head normocephalic and atraumatic.   Neck: supple with no carotid or supraclavicular bruits Cardiovascular: regular rate and rhythm, no murmurs Musculoskeletal: no deformity Skin:  no rash/petichiae Vascular:  Normal pulses all extremities   Neurologic Exam Mental Status: Awake and fully alert.  Fluent speech and language.  Oriented to place and time. Recent and remote memory intact. Attention span, concentration and fund of knowledge appropriate. Mood and affect appropriate.  Cranial Nerves: Fundoscopic exam reveals sharp disc margins. Pupils equal, briskly reactive to light. Extraocular movements full without nystagmus. Visual fields full to confrontation. Hearing intact. Facial sensation intact.  Face, tongue, palate moves normally and symmetrically.   Motor: Normal bulk and tone. Normal strength in all tested extremity muscles Sensory.: intact to touch  Coordination: Finger-to-nose and heel-to-shin performed accurately bilaterally. Gait and Station: Arises from chair without difficulty. Stance is normal. Gait demonstrates short arthritic stride length and balance with no assistive device. Tandem walk and heel toe unsteady.  Reflexes: 1+ and symmetric.    NIHSS  0 Modified Rankin  0    ASSESSMENT: Kristin Schmidt is a 68 y.o. year old female presenting 07/24/2023 with transient episode of right sided weakness, right facial droop lasting 10 minutes. Vascular risk factors include HTN, HLD, tobacco use, ETOH, carotic stenosis.      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 Continue aspirin 81 mg daily and clopidogrel 75 mg daily and rosuvastatin 40mg  daily for secondary stroke prevention. Discuss length of Plavix therapy with vascular surgery at follow up 08/19/2023. Discussed secondary stroke prevention measures and importance of close PCP follow up for aggressive stroke risk factor management. I have gone over the pathophysiology of stroke, warning signs and symptoms, risk factors and their management in some detail with instructions to go to the closest emergency room for symptoms of concern. HTN: BP goal <130/90.  Stable. Continue diltiazem and metoprolol per PCP HLD: LDL goal <70. Recent LDL 126. Continue rosuvastatin 40mg  daily per PCP. Follow up closely if muscle aches do not improve in the next two weeks. Well balanced diet and regular exercise advised.  DMII: A1c goal<7.0. Recent A1c 5.3. Not diabetic. Continue healthy well balanced diet.  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 Consider referral to neurosurgery with any increased growth.  Carotid stenosis: s/p CEA 07/29/2023. Continue close follow up with vascular surgery. Discuss length of  treatment with Plavix with surgeon. Continue rosuvastatin 40mg  daily.  Tobacco use: continue cessation. Continue nicotine patches.  ETOH: continue in moderation. I recommend 1 glass of wine or less per day.    Follow up in 6 months or call earlier if needed   CC:  [facility] provider: Dr. Janett Medin PCP: Jeanene Milder, FNP    I spent 45 minutes of face-to-face and non-face-to-face time with patient.  This included previsit chart review including review of recent hospitalization, lab review, study review, order entry, electronic health record documentation, patient education regarding recent stroke including etiology, secondary stroke prevention measures and importance of managing stroke risk factors, residual deficits and typical recovery time and answered all other questions to patient satisfaction   Terrilyn Fick, [facility]  [facility] [address] [place], [place] [address]  Phone [phone] Fax [phone] Note: This document was prepared with digital dictation and possible smart phrase technology. Any transcriptional errors that result from this process are unintentional.

## 2023-08-18 ENCOUNTER — Encounter: Payer: Self-pay | Admitting: Family Medicine

## 2023-08-18 ENCOUNTER — Ambulatory Visit: Payer: Self-pay | Admitting: Family Medicine

## 2023-08-18 VITALS — BP 110/70 | HR 80 | Ht 61.0 in | Wt 140.0 lb

## 2023-08-18 DIAGNOSIS — I6523 Occlusion and stenosis of bilateral carotid arteries: Secondary | ICD-10-CM | POA: Diagnosis not present

## 2023-08-18 DIAGNOSIS — I671 Cerebral aneurysm, nonruptured: Secondary | ICD-10-CM | POA: Diagnosis not present

## 2023-08-18 DIAGNOSIS — I639 Cerebral infarction, unspecified: Secondary | ICD-10-CM

## 2023-08-19 ENCOUNTER — Ambulatory Visit (INDEPENDENT_AMBULATORY_CARE_PROVIDER_SITE_OTHER): Admitting: Physician Assistant

## 2023-08-19 VITALS — BP 139/83 | HR 109 | Temp 98.2°F | Resp 18 | Ht 61.0 in | Wt 141.3 lb

## 2023-08-19 DIAGNOSIS — I6522 Occlusion and stenosis of left carotid artery: Secondary | ICD-10-CM

## 2023-08-19 NOTE — Progress Notes (Deleted)
 Follow up 3 months carotid Stop plavix after 1 month

## 2023-08-19 NOTE — Progress Notes (Signed)
 POST OPERATIVE OFFICE NOTE    CC:  F/u for surgery  HPI:  Kristin Schmidt is a 68 y.o. female who is here for postop visit.  She recently underwent left carotid endarterectomy on 07/28/2023 by Dr. Fulton Job.  This was done for symptomatic left carotid artery stenosis.  She returns today for follow-up.  She says that she is doing much better since her hospitalization.  She denies any new or worsening strokelike symptoms such as slurred speech, facial droop, sudden weakness/numbness, or sudden visual changes.  Her word finding ability is improving.  She is taking a daily aspirin , Plavix , and statin.   Allergies  Allergen Reactions   Dilaudid  [Hydromorphone  Hcl] Other (See Comments)    Family request. Patient becomes heavily sedated and becomes unresponsive. Hypersensitive to dilaudid .   Penicillins Itching    HIVES 05/24/22 tolerated Cefazolin     Current Outpatient Medications  Medication Sig Dispense Refill   acetaminophen  (TYLENOL ) 325 MG tablet Take 650 mg by mouth every 6 (six) hours as needed.     Ascorbic Acid (VITA-C PO) Take 2 capsules by mouth daily.     aspirin  EC 81 MG tablet Take 81 mg by mouth daily. Swallow whole.     Cholecalciferol (VITAMIN D3 PO) Take 1 capsule by mouth daily.     clopidogrel  (PLAVIX ) 75 MG tablet Take 1 tablet (75 mg total) by mouth daily. 90 tablet 1   diltiazem  (CARDIZEM  CD) 120 MG 24 hr capsule Take 1 capsule (120 mg total) by mouth daily. (Patient taking differently: Take 120 mg by mouth daily. Takes when SBP > 120) 90 capsule 1   metoprolol  tartrate (LOPRESSOR ) 25 MG tablet Take 1 tablet (25 mg total) by mouth 2 (two) times daily as needed. (IF TOP BLOOD PRESSURE IS OVER 160) 60 tablet 0   Misc Natural Products (MILK THISTLE) CAPS Take 1 capsule by mouth 2 (two) times daily.     nicotine  (NICODERM CQ  - DOSED IN MG/24 HOURS) 21 mg/24hr patch Place 21 mg onto the skin daily as needed (for smoking).     rosuvastatin  (CRESTOR ) 40 MG tablet Take 1  tablet (40 mg total) by mouth at bedtime. 90 tablet 1   traZODone  (DESYREL ) 150 MG tablet Take 150 mg by mouth at bedtime.     zinc gluconate 50 MG tablet Take 50 mg by mouth daily.     escitalopram  (LEXAPRO ) 20 MG tablet Take 20 mg by mouth daily. (Patient not taking: Reported on 08/19/2023)     oxyCODONE -acetaminophen  (PERCOCET/ROXICET) 5-325 MG tablet Take 1 tablet by mouth every 6 (six) hours as needed (sever 4-8 pain). 15 tablet 0   TURMERIC PO Take 1 tablet by mouth daily. (Patient not taking: Reported on 08/19/2023)     No current facility-administered medications for this visit.     ROS:  See HPI  Physical Exam:  Incision: Left-sided neck incision healing appropriately without signs of infection or hematoma Extremities: Palpable and equal radial pulses bilaterally Neuro: Motor and sensation of bilateral upper and lower extremities.  No lip droop or tongue deviation    Assessment/Plan:  This is a 68 y.o. female who is here for postop visit  - The patient recently underwent left carotid endarterectomy for symptomatic stenosis -Her left-sided neck incision is healing well without any signs of infection or bleeding -Her word finding ability is improving.  She denies any new or worsening strokelike symptoms -On exam she is moving all of her extremities equally.  She has intact sensation of  bilateral upper and lower extremities -I have recommended that she continue her daily aspirin , plavix , and statin.  She can discontinue her Plavix  at 1 month postop -She can follow-up with our office in 3 months with carotid duplex   Deneise Finlay, PA-C [facility] [phone]   Clinic MD:  Fulton Job

## 2023-08-20 ENCOUNTER — Other Ambulatory Visit: Payer: Self-pay

## 2023-08-20 DIAGNOSIS — I6523 Occlusion and stenosis of bilateral carotid arteries: Secondary | ICD-10-CM

## 2023-09-04 ENCOUNTER — Other Ambulatory Visit: Payer: Self-pay | Admitting: Cardiology

## 2023-10-01 ENCOUNTER — Other Ambulatory Visit: Payer: Self-pay | Admitting: Cardiology

## 2023-11-18 ENCOUNTER — Ambulatory Visit (HOSPITAL_COMMUNITY)

## 2023-11-18 ENCOUNTER — Ambulatory Visit

## 2024-01-13 ENCOUNTER — Ambulatory Visit

## 2024-01-13 ENCOUNTER — Encounter (HOSPITAL_COMMUNITY)

## 2024-02-17 ENCOUNTER — Ambulatory Visit: Attending: Vascular Surgery | Admitting: Physician Assistant

## 2024-02-17 ENCOUNTER — Ambulatory Visit (HOSPITAL_COMMUNITY)
Admission: RE | Admit: 2024-02-17 | Discharge: 2024-02-17 | Disposition: A | Discharge status: Home / Self Care | Source: Ambulatory Visit | Attending: Vascular Surgery | Admitting: Vascular Surgery

## 2024-02-17 VITALS — BP 164/75 | HR 71 | Resp 18 | Ht 61.0 in | Wt 140.4 lb

## 2024-02-17 DIAGNOSIS — E785 Hyperlipidemia, unspecified: Secondary | ICD-10-CM

## 2024-02-17 DIAGNOSIS — I6523 Occlusion and stenosis of bilateral carotid arteries: Secondary | ICD-10-CM | POA: Diagnosis not present

## 2024-02-17 NOTE — Progress Notes (Signed)
 Office Note     CC:  follow up Requesting Provider:  Katrinka Duwaine LABOR, FNP  HPI: Kristin Schmidt is a 68 y.o. (16-Jan-1956) female who presents for surveillance of carotid artery stenosis.  She underwent left carotid endarterectomy on 07/24/2023 by Dr. Gretta due to symptomatic left carotid artery stenosis.  She has not had any further strokelike symptoms since surgery.  She  believes her word finding has improved drastically since her CVA.  She denies any one-sided weakness or vision changes.  She is on aspirin  daily.  She is having trouble tolerating the side effect profile of statins.  She is here today with her daughter who is a Engineer, civil (consulting).  Her daughter is wondering if her mother can be referred to the lipid clinic.   Past Medical History:  Diagnosis Date   Complication of anesthesia    Pt wakes up confused   Coronary artery disease 2007   renal artery stent and subclavian artery stent - [place]    Hypertension    Pneumonia    walking pneumonia   Renal disorder    Tachycardia    pt was treated with Carvedilol , taken off of it in May, 2018 due to feeling fatigued.    Past Surgical History:  Procedure Laterality Date   CESAREAN SECTION     ENDARTERECTOMY Left 07/28/2023   Procedure: ENDARTERECTOMY, CAROTID LEFT;  Surgeon: Gretta Lonni PARAS, MD;  Location: [facility] OR;  Service: Vascular;  Laterality: Left;   FRACTURE SURGERY Left    leg   INTRAMEDULLARY (IM) NAIL INTERTROCHANTERIC Right 05/24/2022   Procedure: INTRAMEDULLARY NAILING OF RIGHT FEMUR;  Surgeon: Kendal Franky SQUIBB, MD;  Location: [facility] OR;  Service: Orthopedics;  Laterality: Right;   ORIF HUMERUS FRACTURE Left 02/20/2017   Procedure: OPEN REDUCTION INTERNAL FIXATION (ORIF) LEFT PROXIMAL HUMERUS FRACTURE;  Surgeon: Melita Franky, MD;  Location: [facility] OR;  Service: Orthopedics;  Laterality: Left;   PATCH ANGIOPLASTY Left 07/28/2023   Procedure: ANGIOPLASTY, USING PATCH GRAFT LEFT CAROTID;  Surgeon: Gretta Lonni PARAS, MD;  Location:  [facility] OR;  Service: Vascular;  Laterality: Left;   stent placement right kidney       Social History   Socioeconomic History   Marital status: Single    Spouse name: Not on file   Number of children: Not on file   Years of education: Not on file   Highest education level: Not on file  Occupational History   Not on file  Tobacco Use   Smoking status: Former    Current packs/day: 0.00    Types: Cigarettes    Quit date: 11/20/2015    Years since quitting: 8.2   Smokeless tobacco: Never  Vaping Use   Vaping status: Never Used  Substance and Sexual Activity   Alcohol use: Yes    Alcohol/week: 7.0 standard drinks of alcohol    Types: 7 Glasses of wine per week    Comment: wine   Drug use: No   Sexual activity: Not on file  Other Topics Concern   Not on file  Social History Narrative   Pt lives with daughter   Retired    Social Drivers of Corporate investment banker Strain: Not on file  Food Insecurity: No Food Insecurity (07/24/2023)   Hunger Vital Sign    Worried About Running Out of Food in the Last Year: Never true    Ran Out of Food in the Last Year: Never true  Transportation Needs: No Transportation Needs (07/24/2023)   PRAPARE -  Administrator, Civil Service (Medical): No    Lack of Transportation (Non-Medical): No  Physical Activity: Not on file  Stress: Not on file  Social Connections: Moderately Isolated (07/24/2023)   Social Connection and Isolation Panel    Frequency of Communication with Friends and Family: More than three times a week    Frequency of Social Gatherings with Friends and Family: Never    Attends Religious Services: 1 to 4 times per year    Active Member of [employer] or Organizations: No    Attends Banker Meetings: Never    Marital Status: Divorced  Catering manager Violence: Not At Risk (07/24/2023)   Humiliation, Afraid, Rape, and Kick questionnaire    Fear of Current or Ex-Partner: No    Emotionally Abused: No     Physically Abused: No    Sexually Abused: No    Family History  Problem Relation Age of Onset   COPD Mother    COPD Father    Cancer Sister    Heart attack Maternal Grandfather    Stroke Neg Hx     Current Outpatient Medications  Medication Sig Dispense Refill   acetaminophen  (TYLENOL ) 325 MG tablet Take 650 mg by mouth every 6 (six) hours as needed.     Ascorbic Acid (VITA-C PO) Take 2 capsules by mouth daily.     aspirin  EC 81 MG tablet Take 81 mg by mouth daily. Swallow whole.     Cholecalciferol (VITAMIN D3 PO) Take 1 capsule by mouth daily.     diltiazem  (CARDIZEM  CD) 120 MG 24 hr capsule Take 1 capsule (120 mg total) by mouth daily. (Patient taking differently: Take 120 mg by mouth daily. Takes when SBP > 120) 90 capsule 1   metoprolol  tartrate (LOPRESSOR ) 25 MG tablet TAKE 1 TABLET BY MOUTH 2 (TWO) TIMES DAILY AS NEEDED. (IF TOP BLOOD PRESSURE IS OVER 160) 30 tablet 0   Misc Natural Products (MILK THISTLE) CAPS Take 1 capsule by mouth 2 (two) times daily.     nicotine  (NICODERM CQ  - DOSED IN MG/24 HOURS) 21 mg/24hr patch Place 21 mg onto the skin daily as needed (for smoking).     rosuvastatin  (CRESTOR ) 40 MG tablet Take 1 tablet (40 mg total) by mouth at bedtime. (Patient taking differently: Take 40 mg by mouth at bedtime. Taking half tablet every other day due to severe cramping.) 90 tablet 1   traZODone  (DESYREL ) 150 MG tablet Take 150 mg by mouth at bedtime.     TURMERIC PO Take 1 tablet by mouth daily.     zinc gluconate 50 MG tablet Take 50 mg by mouth daily.     clopidogrel  (PLAVIX ) 75 MG tablet Take 1 tablet (75 mg total) by mouth daily. 90 tablet 1   escitalopram  (LEXAPRO ) 20 MG tablet Take 20 mg by mouth daily. (Patient not taking: Reported on 08/19/2023)     oxyCODONE -acetaminophen  (PERCOCET/ROXICET) 5-325 MG tablet Take 1 tablet by mouth every 6 (six) hours as needed (sever 4-8 pain). 15 tablet 0   No current facility-administered medications for this visit.     Allergies  Allergen Reactions   Dilaudid  [Hydromorphone  Hcl] Other (See Comments)    Family request. Patient becomes heavily sedated and becomes unresponsive. Hypersensitive to dilaudid .   Penicillins Itching    HIVES 05/24/22 tolerated Cefazolin      REVIEW OF SYSTEMS:  Negative unless noted in HPI [X]  denotes positive finding, [ ]  denotes negative finding Cardiac  Comments:  Chest  pain or chest pressure:    Shortness of breath upon exertion:    Short of breath when lying flat:    Irregular heart rhythm:        Vascular    Pain in calf, thigh, or hip brought on by ambulation:    Pain in feet at night that wakes you up from your sleep:     Blood clot in your veins:    Leg swelling:         Pulmonary    Oxygen at home:    Productive cough:     Wheezing:         Neurologic    Sudden weakness in arms or legs:     Sudden numbness in arms or legs:     Sudden onset of difficulty speaking or slurred speech:    Temporary loss of vision in one eye:     Problems with dizziness:         Gastrointestinal    Blood in stool:     Vomited blood:         Genitourinary    Burning when urinating:     Blood in urine:        Psychiatric    Major depression:         Hematologic    Bleeding problems:    Problems with blood clotting too easily:        Skin    Rashes or ulcers:        Constitutional    Fever or chills:      PHYSICAL EXAMINATION:  Vitals:   02/17/24 0934 02/17/24 0935  BP: (!) 178/84 (!) 164/75  Pulse:  71  Resp:  18  Weight:  140 lb 6.4 oz (63.7 kg)  Height:  5' 1 (1.549 m)    General:  WDWN in NAD; vital signs documented above Gait: Not observed HENT: WNL, normocephalic Pulmonary: normal non-labored breathing Cardiac: regular HR Abdomen: soft, NT, no masses Skin: without rashes Vascular Exam/Pulses: symmetrical radial pulses Extremities: without ischemic changes, without Gangrene , without cellulitis; without open wounds;  Musculoskeletal:  no muscle wasting or atrophy  Neurologic: A&O X 3; CN grossly intact Psychiatric:  The pt has Normal affect.   Non-Invasive Vascular Imaging:   Right ICA 40 to 59% stenosis Left ICA endarterectomy site widely patent    ASSESSMENT/PLAN:: 68 y.o. female here for follow up for surveillance of carotid artery stenosis after left carotid endarterectomy in March of this year  Subjectively, the patient has been doing well since surgery.  Carotid duplex demonstrates a widely patent endarterectomy site in the left ICA.  The right ICA stenosis is estimated to be between 40 and 59%.  No indication for intervention at this time.  She will continue her aspirin  daily.  We will refer her to the lipid clinic with Lum Herald due to her statin intolerance.  We will repeat carotid duplex in 1 year.   Donnice Sender, PA-C [facility] [phone]  Clinic MD:   [place]

## 2024-02-26 ENCOUNTER — Other Ambulatory Visit (HOSPITAL_COMMUNITY): Payer: Self-pay

## 2024-02-26 NOTE — Progress Notes (Deleted)
 [facility] Pharmacist Note  Name: Floree Zuniga  MRN: [id number]  DOB: [birth year]  Sex: female PCP: Katrinka Duwaine LABOR, FNP CPP Referral Provider: Dr. Zipporah Sender, PA-C  HISTORY OF PRESENT ILLNESS: Kristin Schmidt is a 68 y.o. female with PMH symptomatic left carotid artery stenosis, CVA, HTN, HLD, CKD, who presents for medication management for cardiovascular risk reduction. Patient is s/p left carotid endarterectomy on 07/28/23 by Dr. Gretta. Reports history of statin intolerance.   Dyslipidemia/ASCVD  Current lipid-lowering medications: ***  Previously tried medications/intolerances: rosuvastatin  40 mg (***), pravastatin 10 mg (***)  Rx affordability and access: ***   Current antiplatelets/antithrombotics: aspirin  81 mg daily + clopidogrel  75 mg daily Rx affordability and access: ***   Current dietary habits:   Breakfast: ***  Lunch: ***  Supper: ***  Snacks: ***  Drinks: ***   Current physical activity: ***   Patient {ACTION; IS/IS NOT:21021397} up to date on annual influenza vaccine.  Patient {ACTION; IS/IS NOT:21021397} up to date on COVID vaccines.   Past Medical History:  Diagnosis Date   Complication of anesthesia    Pt wakes up confused   Coronary artery disease 2007   renal artery stent and subclavian artery stent - [place]    Hypertension    Pneumonia    walking pneumonia   Renal disorder    Tachycardia    pt was treated with Carvedilol , taken off of it in May, 2018 due to feeling fatigued.   Past Surgical History:  Procedure Laterality Date   CESAREAN SECTION     ENDARTERECTOMY Left 07/28/2023   Procedure: ENDARTERECTOMY, CAROTID LEFT;  Surgeon: Gretta Lonni PARAS, MD;  Location: [facility] OR;  Service: Vascular;  Laterality: Left;   FRACTURE SURGERY Left    leg   INTRAMEDULLARY (IM) NAIL INTERTROCHANTERIC Right 05/24/2022   Procedure: INTRAMEDULLARY NAILING OF RIGHT FEMUR;  Surgeon: Kendal Franky SQUIBB, MD;  Location: [facility] OR;  Service: Orthopedics;   Laterality: Right;   ORIF HUMERUS FRACTURE Left 02/20/2017   Procedure: OPEN REDUCTION INTERNAL FIXATION (ORIF) LEFT PROXIMAL HUMERUS FRACTURE;  Surgeon: Melita Franky, MD;  Location: [facility] OR;  Service: Orthopedics;  Laterality: Left;   PATCH ANGIOPLASTY Left 07/28/2023   Procedure: ANGIOPLASTY, USING PATCH GRAFT LEFT CAROTID;  Surgeon: Gretta Lonni PARAS, MD;  Location: [facility] OR;  Service: Vascular;  Laterality: Left;   stent placement right kidney      Family History  Problem Relation Age of Onset   COPD Mother    COPD Father    Cancer Sister    Heart attack Maternal Grandfather    Stroke Neg Hx    LABS: Lab Results  Component Value Date   CHOL 227 (H) 07/24/2023   HDL 46 07/24/2023   LDLCALC 126 (H) 07/24/2023   TRIG 274 (H) 07/24/2023   CHOLHDL 4.9 07/24/2023    Lab Results  Component Value Date   CREATININE 0.70 07/29/2023   BUN 12 07/29/2023   NA 141 07/29/2023   K 4.0 07/29/2023   CL 109 07/29/2023   CO2 24 07/29/2023   CrCl cannot be calculated (Patient's most recent lab result is older than the maximum 21 days allowed.).      Component Value Date/Time   PROT 6.6 07/23/2023 1930   ALBUMIN  3.1 (L) 07/25/2023 0546   AST 20 07/23/2023 1930   ALT 19 07/23/2023 1930   ALKPHOS 57 07/23/2023 1930   BILITOT 0.4 07/23/2023 1930    Lab Results  Component Value Date   HGBA1C 5.3 07/24/2023  ASSESSMENT & PLAN:  Dyslipidemia LDL {Desc; above/below:16086} goal <55 mg/dL.   ***  Counseled patient on treatment, including efficacy, dosing, administration, possible adverse effects, and anticipated cost.  Reviewed long-term complications of uncontrolled cholesterol.  Reviewed goals for cholesterol readings with patient.  Reviewed dietary and lifestyle modifications to improve cholesterol.  Repeat lipid panel in 4-12 weeks.   Recommend annual influenza and COVID vaccines.   Follow up: ***  Lum Herald, PharmD, BCACP, CPP [facility] [phone]

## 2024-03-02 ENCOUNTER — Ambulatory Visit: Admitting: Student-PharmD

## 2024-03-03 NOTE — Progress Notes (Deleted)
 [facility] [address] [place]. [place] [address] [phone]       HOSPITAL FOLLOW UP NOTE  [name] Date of Birth:  [birth year] Medical Record Number:  [id number]   Reason for Referral:  hospital stroke follow up    SUBJECTIVE:   CHIEF COMPLAINT:  No chief complaint on file.   HPI:   Kristin Schmidt is a 68 y.o. who  has a past medical history of Complication of anesthesia, Coronary artery disease (2007), Hypertension, Pneumonia, Renal disorder, and Tachycardia.  Patient presented on 07/24/2023 for transient episode of right sided weakness, right facial droop lasting 10 minutes. CT showed questionable left frontal infarct. MCA showed left ACA infarct. CTA showed left ICA bulb severe stenosis and right ICA bulb 65% stenosis. Also noted to have 3mm left cerebral aneurysm. Vascular consulted and CEA planned for 07/28/23. LDL 126, TG 274, A1C 5.3. TNK not given due to resolution of symptoms. She was started on atorvastatin  80mg , asa 81 and Plavix . Personally reviewed hospitalization pertinent progress notes, lab work and imaging.  Evaluated by Dr Jerri.    Since discharge, she reports doing fairly well. She did note significant fatigue and sleepiness following discharge but this seems to be improving. Mild word finding difficulty that is also improving.   Previously on pravastatin a couple weeks prior to stroke. She reported significant muscle cramps and was switched to atorvastatin  in the hospital. She had taken atorvastatin  years ago and it caused significant muscle spasms and pain. Atorvastatin  was switched to rosuvastatin  by PCP about 2 weeks ago. She reports tolerating it fairly well but does note more stiffness in the mornings. She reports stiffness improves once she gets moving. She was previously very active. She likes to use stationary peddler and hoping to resume soon.   CEA performed 3/25. She is recovering well. Follow up with vascular  tomorrow, 4/15. She has continued asa and Plavix  and tolerating well. No unusual bleeding. Aneurysm being monitored. No intervention advised at this time. Sister had ruptured aneurysm, managed by Dr Gillie.   BP has been lower than usual. It was 140/60 this morning but daughter reports multiple low readings less than 120 sys at home. Cardizem  was reduced from 180 to 120mg  daily. Metoprolol  used only as needed. She has taken once in the past 2 months.   She is no longer smoking. Using nicotine  patches. She does drink a glass of wine at bedtime.    UPDATE 03/08/2024 ALL:  Kristin Schmidt returns for follow up post CVA.   She was seen by Dr Bethanie, vascular, 02/17/2024. Carotid duplex showed a widely patent endarterectomy site in the left ICA. The right ICA stenosis is estimated to be between 40 and 59%. NO intervention needed at this time. She was advised to continue aspirin . Referral placed to lipid clinic as she could not tolerate statin.   She is followed closely by PCP. BP has been   Dr Gillie?  PERTINENT IMAGING/LABS  CT questionable left frontal infarct CTA head and neck left ICA bulb severe stenosis, right ICA bulb 65% stenosis.  Bilateral siphon atherosclerosis left more than right.  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 MRI left ACA infarct 2D Echo EF 60 to 65%   A1C Lab Results  Component Value Date   HGBA1C 5.3 07/24/2023    Lipid Panel     Component Value Date/Time   CHOL 227 (H) 07/24/2023 0030   TRIG 274 (H) 07/24/2023 0030  HDL 46 07/24/2023 0030   CHOLHDL 4.9 07/24/2023 0030   VLDL 55 (H) 07/24/2023 0030   LDLCALC 126 (H) 07/24/2023 0030      ROS:   14 system review of systems performed and negative with exception of those listed in HPI  PMH:  Past Medical History:  Diagnosis Date   Complication of anesthesia    Pt wakes up confused   Coronary artery disease 2007   renal artery stent and subclavian artery stent -  [place]    Hypertension    Pneumonia    walking pneumonia   Renal disorder    Tachycardia    pt was treated with Carvedilol , taken off of it in May, 2018 due to feeling fatigued.    PSH:  Past Surgical History:  Procedure Laterality Date   CESAREAN SECTION     ENDARTERECTOMY Left 07/28/2023   Procedure: ENDARTERECTOMY, CAROTID LEFT;  Surgeon: Gretta Lonni PARAS, MD;  Location: [facility] OR;  Service: Vascular;  Laterality: Left;   FRACTURE SURGERY Left    leg   INTRAMEDULLARY (IM) NAIL INTERTROCHANTERIC Right 05/24/2022   Procedure: INTRAMEDULLARY NAILING OF RIGHT FEMUR;  Surgeon: Kendal Franky SQUIBB, MD;  Location: [facility] OR;  Service: Orthopedics;  Laterality: Right;   ORIF HUMERUS FRACTURE Left 02/20/2017   Procedure: OPEN REDUCTION INTERNAL FIXATION (ORIF) LEFT PROXIMAL HUMERUS FRACTURE;  Surgeon: Melita Franky, MD;  Location: [facility] OR;  Service: Orthopedics;  Laterality: Left;   PATCH ANGIOPLASTY Left 07/28/2023   Procedure: ANGIOPLASTY, USING PATCH GRAFT LEFT CAROTID;  Surgeon: Gretta Lonni PARAS, MD;  Location: [facility] OR;  Service: Vascular;  Laterality: Left;   stent placement right kidney       Social History:  Social History   Socioeconomic History   Marital status: Single    Spouse name: Not on file   Number of children: Not on file   Years of education: Not on file   Highest education level: Not on file  Occupational History   Not on file  Tobacco Use   Smoking status: Former    Current packs/day: 0.00    Types: Cigarettes    Quit date: 11/20/2015    Years since quitting: 8.2   Smokeless tobacco: Never  Vaping Use   Vaping status: Never Used  Substance and Sexual Activity   Alcohol use: Yes    Alcohol/week: 7.0 standard drinks of alcohol    Types: 7 Glasses of wine per week    Comment: wine   Drug use: No   Sexual activity: Not on file  Other Topics Concern   Not on file  Social History Narrative   Pt lives with daughter   Retired    Social Drivers of Manufacturing Engineer Strain: Not on file  Food Insecurity: No Food Insecurity (07/24/2023)   Hunger Vital Sign    Worried About Running Out of Food in the Last Year: Never true    Ran Out of Food in the Last Year: Never true  Transportation Needs: No Transportation Needs (07/24/2023)   PRAPARE - Administrator, Civil Service (Medical): No    Lack of Transportation (Non-Medical): No  Physical Activity: Not on file  Stress: Not on file  Social Connections: Moderately Isolated (07/24/2023)   Social Connection and Isolation Panel    Frequency of Communication with Friends and Family: More than three times a week    Frequency of Social Gatherings with Friends and Family: Never    Attends Religious Services: 1 to  4 times per year    Active Member of Clubs or Organizations: No    Attends Banker Meetings: Never    Marital Status: Divorced  Catering Manager Violence: Not At Risk (07/24/2023)   Humiliation, Afraid, Rape, and Kick questionnaire    Fear of Current or Ex-Partner: No    Emotionally Abused: No    Physically Abused: No    Sexually Abused: No    Family History:  Family History  Problem Relation Age of Onset   COPD Mother    COPD Father    Cancer Sister    Heart attack Maternal Grandfather    Stroke Neg Hx     Medications:   Current Outpatient Medications on File Prior to Visit  Medication Sig Dispense Refill   acetaminophen  (TYLENOL ) 325 MG tablet Take 650 mg by mouth every 6 (six) hours as needed.     Ascorbic Acid (VITA-C PO) Take 2 capsules by mouth daily.     aspirin  EC 81 MG tablet Take 81 mg by mouth daily. Swallow whole.     Cholecalciferol (VITAMIN D3 PO) Take 1 capsule by mouth daily.     clopidogrel  (PLAVIX ) 75 MG tablet Take 1 tablet (75 mg total) by mouth daily. 90 tablet 1   diltiazem  (CARDIZEM  CD) 120 MG 24 hr capsule Take 1 capsule (120 mg total) by mouth daily. (Patient taking differently: Take 120 mg by mouth daily. Takes when SBP >  120) 90 capsule 1   escitalopram  (LEXAPRO ) 20 MG tablet Take 20 mg by mouth daily. (Patient not taking: Reported on 08/19/2023)     metoprolol  tartrate (LOPRESSOR ) 25 MG tablet TAKE 1 TABLET BY MOUTH 2 (TWO) TIMES DAILY AS NEEDED. (IF TOP BLOOD PRESSURE IS OVER 160) 30 tablet 0   Misc Natural Products (MILK THISTLE) CAPS Take 1 capsule by mouth 2 (two) times daily.     nicotine  (NICODERM CQ  - DOSED IN MG/24 HOURS) 21 mg/24hr patch Place 21 mg onto the skin daily as needed (for smoking).     oxyCODONE -acetaminophen  (PERCOCET/ROXICET) 5-325 MG tablet Take 1 tablet by mouth every 6 (six) hours as needed (sever 4-8 pain). 15 tablet 0   rosuvastatin  (CRESTOR ) 40 MG tablet Take 1 tablet (40 mg total) by mouth at bedtime. (Patient taking differently: Take 40 mg by mouth at bedtime. Taking half tablet every other day due to severe cramping.) 90 tablet 1   traZODone  (DESYREL ) 150 MG tablet Take 150 mg by mouth at bedtime.     TURMERIC PO Take 1 tablet by mouth daily.     zinc gluconate 50 MG tablet Take 50 mg by mouth daily.     No current facility-administered medications on file prior to visit.    Allergies:   Allergies  Allergen Reactions   Dilaudid  [Hydromorphone  Hcl] Other (See Comments)    Family request. Patient becomes heavily sedated and becomes unresponsive. Hypersensitive to dilaudid .   Penicillins Itching    HIVES 05/24/22 tolerated Cefazolin       OBJECTIVE:  Physical Exam  There were no vitals filed for this visit.  There is no height or weight on file to calculate BMI. No results found.      No data to display           General: well developed, well nourished, seated, in no evident distress Head: head normocephalic and atraumatic.   Neck: supple with no carotid or supraclavicular bruits Cardiovascular: regular rate and rhythm, no murmurs Musculoskeletal: no deformity Skin:  no rash/petichiae Vascular:  Normal pulses all extremities   Neurologic Exam Mental  Status: Awake and fully alert.  Fluent speech and language.  Oriented to place and time. Recent and remote memory intact. Attention span, concentration and fund of knowledge appropriate. Mood and affect appropriate.  Cranial Nerves: Fundoscopic exam reveals sharp disc margins. Pupils equal, briskly reactive to light. Extraocular movements full without nystagmus. Visual fields full to confrontation. Hearing intact. Facial sensation intact. Face, tongue, palate moves normally and symmetrically.  Motor: Normal bulk and tone. Normal strength in all tested extremity muscles Sensory.: intact to touch  Coordination: Finger-to-nose and heel-to-shin performed accurately bilaterally. Gait and Station: Arises from chair without difficulty. Stance is normal. Gait demonstrates short arthritic stride length and balance with no assistive device. Tandem walk and heel toe unsteady.  Reflexes: 1+ and symmetric.    NIHSS  0 Modified Rankin  0    ASSESSMENT: Rogan Ecklund is a 68 y.o. year old female presenting 07/24/2023 with transient episode of right sided weakness, right facial droop lasting 10 minutes. Vascular risk factors include HTN, HLD, tobacco use, ETOH, carotic stenosis.      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 Continue aspirin  81 mg daily and clopidogrel  75 mg daily and rosuvastatin  40mg  daily for secondary stroke prevention. Discuss length of Plavix  therapy with vascular surgery at follow up 08/19/2023. Discussed secondary stroke prevention measures and importance of close PCP follow up for aggressive stroke risk factor management. I have gone over the pathophysiology of stroke, warning signs and symptoms, risk factors and their management in some detail with instructions to go to the closest emergency room for symptoms of concern. HTN: BP goal <130/90.  Stable. Continue diltiazem  and metoprolol  per PCP HLD: LDL goal  <70. Recent LDL 126. Continue rosuvastatin  40mg  daily per PCP. Follow up closely if muscle aches do not improve in the next two weeks. Well balanced diet and regular exercise advised.  DMII: A1c goal<7.0. Recent A1c 5.3. Not diabetic. Continue healthy well balanced diet.  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 Consider referral to neurosurgery with any increased growth.  Carotid stenosis: s/p CEA 07/29/2023. Continue close follow up with vascular surgery. Discuss length of treatment with Plavix  with surgeon. Continue rosuvastatin  40mg  daily.  Tobacco use: continue cessation. Continue nicotine  patches.  ETOH: continue in moderation. I recommend 1 glass of wine or less per day.    Follow up in 6 months or call earlier if needed   CC:  [facility] provider: Dr. Rosemarie PCP: Katrinka Duwaine LABOR, FNP    I spent 45 minutes of face-to-face and non-face-to-face time with patient.  This included previsit chart review including review of recent hospitalization, lab review, study review, order entry, electronic health record documentation, patient education regarding recent stroke including etiology, secondary stroke prevention measures and importance of managing stroke risk factors, residual deficits and typical recovery time and answered all other questions to patient satisfaction   Greig Forbes, [facility]  [facility] [address] [place], [place] [address]  Phone [phone] Fax [phone] Note: This document was prepared with digital dictation and possible smart phrase technology. Any transcriptional errors that result from this process are unintentional.

## 2024-03-08 ENCOUNTER — Ambulatory Visit: Admitting: Family Medicine

## 2024-03-08 ENCOUNTER — Telehealth: Payer: Self-pay | Admitting: Family Medicine

## 2024-03-08 ENCOUNTER — Encounter: Payer: Self-pay | Admitting: Family Medicine

## 2024-03-08 DIAGNOSIS — I671 Cerebral aneurysm, nonruptured: Secondary | ICD-10-CM

## 2024-03-08 DIAGNOSIS — I6523 Occlusion and stenosis of bilateral carotid arteries: Secondary | ICD-10-CM

## 2024-03-08 DIAGNOSIS — Z8673 Personal history of transient ischemic attack (TIA), and cerebral infarction without residual deficits: Secondary | ICD-10-CM

## 2024-03-08 NOTE — Telephone Encounter (Signed)
 Patient's daughter, Marcel cancelled patient appointment due to apartment putting roofing and can not get her out for the appointment. Informed Ms. Raslowski of the show fee of $50. She said she would call back to reschedule, but would still like to cancel the appointment. Informed her there will still be a show fee. Ms. Ruthe verbalized understand.

## 2024-03-11 ENCOUNTER — Encounter: Admitting: Student-PharmD

## 2024-03-24 NOTE — Progress Notes (Signed)
 [facility] Pharmacist Note  Name: Kristin Schmidt  MRN: [id number]  DOB: 1955-11-15  Sex: female PCP: Katrinka Duwaine LABOR, FNP CPP Referral Provider: Dr. Zipporah Sender, PA-C  HISTORY OF PRESENT ILLNESS: Kristin Schmidt is a 67 y.o. female with PMH symptomatic left carotid artery stenosis, CVA, HTN, HLD, who presents for medication management for cardiovascular risk reduction. Patient is s/p left carotid endarterectomy on 07/28/23 by Dr. Gretta. Reports history of statin intolerance.   Dyslipidemia/ASCVD  Current lipid-lowering medications: rosuvastatin  20 mg every other day   Previously tried medications/intolerances: rosuvastatin  40 mg daily and pravastatin 10 mg. Patient reports myopathy with prior statin use. Has had less symptoms since decreasing to current dose but still has muscle weakness the day after she takes the statin.  Rx affordability and access: None identified    Current antiplatelets/antithrombotics: none (self-discontinued aspirin  3 weeks ago) Rx affordability and access: None identified    Patient is not up to date on annual influenza vaccine.  Patient is not up to date on COVID vaccines.  Patient declines vaccines today.   Past Medical History:  Diagnosis Date   Complication of anesthesia    Pt wakes up confused   Coronary artery disease 2007   renal artery stent and subclavian artery stent - [place]    Hypertension    Pneumonia    walking pneumonia   Renal disorder    Tachycardia    pt was treated with Carvedilol , taken off of it in May, 2018 due to feeling fatigued.   Past Surgical History:  Procedure Laterality Date   CESAREAN SECTION     ENDARTERECTOMY Left 07/28/2023   Procedure: ENDARTERECTOMY, CAROTID LEFT;  Surgeon: Gretta Lonni PARAS, MD;  Location: [facility] OR;  Service: Vascular;  Laterality: Left;   FRACTURE SURGERY Left    leg   INTRAMEDULLARY (IM) NAIL INTERTROCHANTERIC Right 05/24/2022   Procedure: INTRAMEDULLARY NAILING OF RIGHT FEMUR;   Surgeon: Kendal Franky SQUIBB, MD;  Location: [facility] OR;  Service: Orthopedics;  Laterality: Right;   ORIF HUMERUS FRACTURE Left 02/20/2017   Procedure: OPEN REDUCTION INTERNAL FIXATION (ORIF) LEFT PROXIMAL HUMERUS FRACTURE;  Surgeon: Melita Franky, MD;  Location: [facility] OR;  Service: Orthopedics;  Laterality: Left;   PATCH ANGIOPLASTY Left 07/28/2023   Procedure: ANGIOPLASTY, USING PATCH GRAFT LEFT CAROTID;  Surgeon: Gretta Lonni PARAS, MD;  Location: [facility] OR;  Service: Vascular;  Laterality: Left;   stent placement right kidney      Family History  Problem Relation Age of Onset   COPD Mother    COPD Father    Cancer Sister    Heart attack Maternal Grandfather    Stroke Neg Hx    LABS: Lab Results  Component Value Date   CHOL 227 (H) 07/24/2023   HDL 46 07/24/2023   LDLCALC 126 (H) 07/24/2023   TRIG 274 (H) 07/24/2023   CHOLHDL 4.9 07/24/2023    Lab Results  Component Value Date   CREATININE 0.70 07/29/2023   BUN 12 07/29/2023   NA 141 07/29/2023   K 4.0 07/29/2023   CL 109 07/29/2023   CO2 24 07/29/2023   CrCl cannot be calculated (Patient's most recent lab result is older than the maximum 21 days allowed.).      Component Value Date/Time   PROT 6.6 07/23/2023 1930   ALBUMIN  3.1 (L) 07/25/2023 0546   AST 20 07/23/2023 1930   ALT 19 07/23/2023 1930   ALKPHOS 57 07/23/2023 1930   BILITOT 0.4 07/23/2023 1930    Lab Results  Component  Value Date   HGBA1C 5.3 07/24/2023    ASSESSMENT & PLAN:  Dyslipidemia LDL above goal <55 mg/dL. Last LDL of 126 was prior to statin start.  Patient fasted this morning so will obtain updated lipid panel today. Depending on the results, will consider starting Zetia or Repatha . Patient was initially against the idea of injectables but after discussion today is open to this if needed.  Continue rosuvastatin  20 mg every other day.  Counseled patient on treatment, including efficacy, dosing, administration, possible adverse effects, and anticipated  cost.  Reviewed long-term complications of uncontrolled cholesterol.  Reviewed goals for cholesterol readings with patient.  Reviewed dietary and lifestyle modifications to improve cholesterol.  Repeat lipid panel in 4-12 weeks after medication changes.    Antiplatelets/Antithrombotics Patient is s/p left carotid endarterectomy on 07/28/23. Patient stopped aspirin  81 mg 3 weeks ago because she began taking ibuprofen and was unsure if they could be taken together. Counseled patient to resume aspirin  81 mg today.   Counseled patient on treatment, including efficacy, dosing, administration, possible adverse effects, and anticipated cost.    Hypertension BP above goal <130/80 mmHg at visit today. Patient reports having a history of white-coat syndrome.  Patient and daughter report BP is controlled at home.   Instructed patient to continue checking BP at home.   Patient declined annual influenza and COVID vaccines.   Follow up: Obtain fasting lipid panel today. Will call patient with results and discuss next steps.   Duwaine Dry Student Pharmacist  03/24/2024,3:01 PM  Lum Herald, PharmD, BCACP, CPP [facility] [phone]

## 2024-03-25 ENCOUNTER — Encounter: Payer: Self-pay | Admitting: Student-PharmD

## 2024-03-25 ENCOUNTER — Ambulatory Visit: Attending: Vascular Surgery | Admitting: Student-PharmD

## 2024-03-25 VITALS — BP 163/80 | HR 75 | Wt 139.4 lb

## 2024-03-25 DIAGNOSIS — E785 Hyperlipidemia, unspecified: Secondary | ICD-10-CM | POA: Diagnosis not present

## 2024-03-25 DIAGNOSIS — I6523 Occlusion and stenosis of bilateral carotid arteries: Secondary | ICD-10-CM

## 2024-03-25 NOTE — Patient Instructions (Signed)
 Start taking aspirin  81 mg daily.   Continue rosuvastatin  20 mg every other day.   We are checking a lipid panel today. I will call you when I get the result and we can discuss next steps, which will likely be either adding on Zetia (pill) or Repatha (injection).

## 2024-03-29 ENCOUNTER — Other Ambulatory Visit: Payer: Self-pay | Admitting: Student-PharmD

## 2024-03-30 ENCOUNTER — Ambulatory Visit: Payer: Self-pay | Admitting: Student-PharmD

## 2024-03-30 ENCOUNTER — Other Ambulatory Visit (HOSPITAL_COMMUNITY): Payer: Self-pay

## 2024-03-30 ENCOUNTER — Telehealth: Payer: Self-pay | Admitting: Pharmacy Technician

## 2024-03-30 DIAGNOSIS — E785 Hyperlipidemia, unspecified: Secondary | ICD-10-CM

## 2024-03-30 DIAGNOSIS — I6523 Occlusion and stenosis of bilateral carotid arteries: Secondary | ICD-10-CM

## 2024-03-30 LAB — COMPREHENSIVE METABOLIC PANEL WITH GFR
ALT: 17 IU/L (ref 0–32)
AST: 23 IU/L (ref 0–40)
Albumin: 4.4 g/dL (ref 3.9–4.9)
Alkaline Phosphatase: 72 IU/L (ref 49–135)
BUN/Creatinine Ratio: 14 (ref 12–28)
BUN: 10 mg/dL (ref 8–27)
Bilirubin Total: 0.4 mg/dL (ref 0.0–1.2)
CO2: 25 mmol/L (ref 20–29)
Calcium: 9.4 mg/dL (ref 8.7–10.3)
Chloride: 100 mmol/L (ref 96–106)
Creatinine, Ser: 0.7 mg/dL (ref 0.57–1.00)
Globulin, Total: 2.4 g/dL (ref 1.5–4.5)
Glucose: 81 mg/dL (ref 70–99)
Potassium: 4.4 mmol/L (ref 3.5–5.2)
Sodium: 140 mmol/L (ref 134–144)
Total Protein: 6.8 g/dL (ref 6.0–8.5)
eGFR: 93 mL/min/1.73 (ref >59)

## 2024-03-30 LAB — LIPID PANEL
Chol/HDL Ratio: 3.7 ratio (ref 0.0–4.4)
Cholesterol, Total: 178 mg/dL (ref 100–199)
HDL: 48 mg/dL (ref >39)
LDL Chol Calc (NIH): 80 mg/dL (ref 0–99)
Triglycerides: 309 mg/dL — ABNORMAL HIGH (ref 0–149)
VLDL Cholesterol Cal: 50 mg/dL — ABNORMAL HIGH (ref 5–40)

## 2024-03-30 NOTE — Telephone Encounter (Signed)
 Pharmacy Patient Advocate Encounter  Received notification from [employer] that Prior Authorization for repatha  has been APPROVED from 03/30/24 to 09/26/24. Ran test claim, Copay is $47.00- one month. This test claim was processed through [facility]- copay amounts may vary at other pharmacies due to pharmacy/plan contracts, or as the patient moves through the different stages of their insurance plan.   PA #/Case ID/Reference #: [id number]

## 2024-03-30 NOTE — Telephone Encounter (Signed)
 Pharmacy Patient Advocate Encounter   Received notification from Physician's Office that prior authorization for repatha  is required/requested.   Insurance verification completed.   The patient is insured through [facility] [employer].   Per test claim: PA required; PA submitted to above mentioned insurance via Latent Key/confirmation #/EOC [id number] Status is pending

## 2024-03-31 ENCOUNTER — Other Ambulatory Visit (HOSPITAL_BASED_OUTPATIENT_CLINIC_OR_DEPARTMENT_OTHER): Payer: Self-pay

## 2024-03-31 MED ORDER — ROSUVASTATIN CALCIUM 5 MG PO TABS
5.0000 mg | ORAL_TABLET | Freq: Every day | ORAL | 11 refills | Status: AC
  Filled 2024-03-31: qty 30, 30d supply, fill #0
  Filled 2024-06-11: qty 30, 30d supply, fill #1

## 2024-03-31 MED ORDER — REPATHA SURECLICK 140 MG/ML ~~LOC~~ SOAJ
140.0000 mg | SUBCUTANEOUS | 11 refills | Status: AC
  Filled 2024-03-31: qty 2, 28d supply, fill #0
  Filled 2024-04-26: qty 2, 28d supply, fill #1
  Filled 2024-06-11: qty 2, 28d supply, fill #2

## 2024-03-31 NOTE — Addendum Note (Signed)
 Addended by: BARBARANN LUM NOVAK on: 03/31/2024 09:43 AM   Modules accepted: Orders

## 2024-03-31 NOTE — Telephone Encounter (Signed)
 PA for Repatha  approved and patient okay with cost of $47/month. Asks that this be sent to [facility] pharmacy. Counseled patient on Repatha  and all questions have been answered. Patient also asks if she can stop taking her statin because her muscle pain is getting worse. Agreeable to trying lower dose of statin. Sent in Rx for rosuvastatin  5 mg daily. Patient will return for PharmD visit in January and come for fasting labs to repeat lipid panel before this visit.

## 2024-04-15 ENCOUNTER — Encounter: Payer: Self-pay | Admitting: Vascular Surgery

## 2024-06-03 ENCOUNTER — Ambulatory Visit: Admitting: Student-PharmD

## 2024-06-10 ENCOUNTER — Ambulatory Visit: Admitting: Student-PharmD

## 2024-06-11 ENCOUNTER — Other Ambulatory Visit (HOSPITAL_BASED_OUTPATIENT_CLINIC_OR_DEPARTMENT_OTHER): Payer: Self-pay

## 2024-06-21 ENCOUNTER — Ambulatory Visit: Admitting: Student-PharmD
